# Patient Record
Sex: Male | Born: 1960 | Race: White | Hispanic: No | Marital: Single | Smoking: Current every day smoker
Health system: Southern US, Community
[De-identification: ages and names within clinical notes are randomized; demographics above are authoritative.]

## PROBLEM LIST (undated history)

## (undated) DIAGNOSIS — K219 Gastro-esophageal reflux disease without esophagitis: Secondary | ICD-10-CM

## (undated) DIAGNOSIS — I639 Cerebral infarction, unspecified: Secondary | ICD-10-CM

## (undated) DIAGNOSIS — I10 Essential (primary) hypertension: Secondary | ICD-10-CM

## (undated) DIAGNOSIS — I619 Nontraumatic intracerebral hemorrhage, unspecified: Secondary | ICD-10-CM

## (undated) DIAGNOSIS — J189 Pneumonia, unspecified organism: Secondary | ICD-10-CM

## (undated) DIAGNOSIS — Z8719 Personal history of other diseases of the digestive system: Secondary | ICD-10-CM

## (undated) DIAGNOSIS — F419 Anxiety disorder, unspecified: Secondary | ICD-10-CM

## (undated) DIAGNOSIS — C029 Malignant neoplasm of tongue, unspecified: Secondary | ICD-10-CM

## (undated) DIAGNOSIS — Z8711 Personal history of peptic ulcer disease: Secondary | ICD-10-CM

## (undated) DIAGNOSIS — I2699 Other pulmonary embolism without acute cor pulmonale: Secondary | ICD-10-CM

## (undated) DIAGNOSIS — Z923 Personal history of irradiation: Secondary | ICD-10-CM

## (undated) HISTORY — PX: APPENDECTOMY: SHX54

## (undated) HISTORY — DX: Personal history of irradiation: Z92.3

---

## 2012-01-25 DIAGNOSIS — I639 Cerebral infarction, unspecified: Secondary | ICD-10-CM

## 2012-01-25 HISTORY — DX: Cerebral infarction, unspecified: I63.9

## 2012-02-20 ENCOUNTER — Emergency Department (HOSPITAL_COMMUNITY): Payer: Medicaid Other

## 2012-02-20 ENCOUNTER — Encounter (HOSPITAL_COMMUNITY): Admission: EM | Disposition: A | Discharge status: Home / Self Care | Payer: Self-pay | Source: Home / Self Care

## 2012-02-20 ENCOUNTER — Emergency Department (HOSPITAL_COMMUNITY): Payer: Medicaid Other | Admitting: Anesthesiology

## 2012-02-20 ENCOUNTER — Encounter (HOSPITAL_COMMUNITY): Payer: Self-pay | Admitting: *Deleted

## 2012-02-20 ENCOUNTER — Inpatient Hospital Stay (HOSPITAL_COMMUNITY)
Admission: EM | Admit: 2012-02-20 | Discharge: 2012-02-24 | DRG: 023 | Disposition: A | Discharge status: Home / Self Care | Payer: Medicaid Other | Attending: Emergency Medicine | Admitting: Emergency Medicine

## 2012-02-20 ENCOUNTER — Encounter (HOSPITAL_COMMUNITY): Payer: Self-pay | Admitting: Anesthesiology

## 2012-02-20 DIAGNOSIS — F1010 Alcohol abuse, uncomplicated: Secondary | ICD-10-CM | POA: Diagnosis present

## 2012-02-20 DIAGNOSIS — R279 Unspecified lack of coordination: Secondary | ICD-10-CM | POA: Diagnosis present

## 2012-02-20 DIAGNOSIS — I619 Nontraumatic intracerebral hemorrhage, unspecified: Principal | ICD-10-CM | POA: Diagnosis present

## 2012-02-20 DIAGNOSIS — E876 Hypokalemia: Secondary | ICD-10-CM | POA: Diagnosis not present

## 2012-02-20 DIAGNOSIS — J9600 Acute respiratory failure, unspecified whether with hypoxia or hypercapnia: Secondary | ICD-10-CM | POA: Diagnosis present

## 2012-02-20 DIAGNOSIS — I614 Nontraumatic intracerebral hemorrhage in cerebellum: Secondary | ICD-10-CM | POA: Diagnosis present

## 2012-02-20 DIAGNOSIS — J95821 Acute postprocedural respiratory failure: Secondary | ICD-10-CM | POA: Diagnosis not present

## 2012-02-20 DIAGNOSIS — Z79899 Other long term (current) drug therapy: Secondary | ICD-10-CM

## 2012-02-20 DIAGNOSIS — R51 Headache: Secondary | ICD-10-CM | POA: Diagnosis present

## 2012-02-20 DIAGNOSIS — I629 Nontraumatic intracranial hemorrhage, unspecified: Secondary | ICD-10-CM

## 2012-02-20 DIAGNOSIS — I69393 Ataxia following cerebral infarction: Secondary | ICD-10-CM

## 2012-02-20 DIAGNOSIS — R4182 Altered mental status, unspecified: Secondary | ICD-10-CM | POA: Diagnosis present

## 2012-02-20 DIAGNOSIS — I10 Essential (primary) hypertension: Secondary | ICD-10-CM | POA: Diagnosis present

## 2012-02-20 DIAGNOSIS — R519 Headache, unspecified: Secondary | ICD-10-CM | POA: Diagnosis present

## 2012-02-20 DIAGNOSIS — R0902 Hypoxemia: Secondary | ICD-10-CM | POA: Diagnosis present

## 2012-02-20 DIAGNOSIS — F17200 Nicotine dependence, unspecified, uncomplicated: Secondary | ICD-10-CM | POA: Diagnosis present

## 2012-02-20 HISTORY — DX: Nontraumatic intracerebral hemorrhage, unspecified: I61.9

## 2012-02-20 HISTORY — PX: CRANIECTOMY: SHX331

## 2012-02-20 HISTORY — DX: Personal history of peptic ulcer disease: Z87.11

## 2012-02-20 HISTORY — DX: Personal history of other diseases of the digestive system: Z87.19

## 2012-02-20 LAB — COMPREHENSIVE METABOLIC PANEL
ALT: 16 U/L (ref 0–53)
AST: 26 U/L (ref 0–37)
Albumin: 3.8 g/dL (ref 3.5–5.2)
CO2: 20 mEq/L (ref 19–32)
Calcium: 9.1 mg/dL (ref 8.4–10.5)
Creatinine, Ser: 0.73 mg/dL (ref 0.50–1.35)
GFR calc non Af Amer: >90 mL/min (ref >90)
Sodium: 136 mEq/L (ref 135–145)

## 2012-02-20 LAB — BASIC METABOLIC PANEL
CO2: 22 mEq/L (ref 19–32)
Calcium: 8.3 mg/dL — ABNORMAL LOW (ref 8.4–10.5)
Glucose, Bld: 193 mg/dL — ABNORMAL HIGH (ref 70–99)
Potassium: 2.9 mEq/L — ABNORMAL LOW (ref 3.5–5.1)
Sodium: 130 mEq/L — ABNORMAL LOW (ref 135–145)

## 2012-02-20 LAB — DIFFERENTIAL
Basophils Absolute: 0 10*3/uL (ref 0.0–0.1)
Basophils Relative: 0 % (ref 0–1)
Eosinophils Relative: 1 % (ref 0–5)
Lymphocytes Relative: 11 % — ABNORMAL LOW (ref 12–46)
Monocytes Absolute: 0.8 10*3/uL (ref 0.1–1.0)
Neutro Abs: 13.8 10*3/uL — ABNORMAL HIGH (ref 1.7–7.7)

## 2012-02-20 LAB — CBC
Hemoglobin: 12.3 g/dL — ABNORMAL LOW (ref 13.0–17.0)
MCH: 32.7 pg (ref 26.0–34.0)
MCHC: 35.3 g/dL (ref 30.0–36.0)
MCV: 94.1 fL (ref 78.0–100.0)
MCV: 93.6 fL (ref 78.0–100.0)
Platelets: 246 10*3/uL (ref 150–400)
Platelets: 232 10*3/uL (ref 150–400)
RBC: 3.76 MIL/uL — ABNORMAL LOW (ref 4.22–5.81)
RDW: 13.6 % (ref 11.5–15.5)
WBC: 16.6 10*3/uL — ABNORMAL HIGH (ref 4.0–10.5)
WBC: 14.6 10*3/uL — ABNORMAL HIGH (ref 4.0–10.5)

## 2012-02-20 LAB — URINALYSIS, ROUTINE W REFLEX MICROSCOPIC
Bilirubin Urine: NEGATIVE
Hgb urine dipstick: NEGATIVE
Ketones, ur: NEGATIVE mg/dL
Nitrite: NEGATIVE
Protein, ur: NEGATIVE mg/dL
Specific Gravity, Urine: 1.014 (ref 1.005–1.030)
Urobilinogen, UA: 0.2 mg/dL (ref 0.0–1.0)

## 2012-02-20 LAB — PROTIME-INR
INR: 0.98 (ref 0.00–1.49)
Prothrombin Time: 12.8 seconds (ref 11.6–15.2)

## 2012-02-20 LAB — HEPATIC FUNCTION PANEL
ALT: 14 U/L (ref 0–53)
AST: 21 U/L (ref 0–37)
Alkaline Phosphatase: 63 U/L (ref 39–117)
Bilirubin, Direct: 0.1 mg/dL (ref 0.0–0.3)
Total Bilirubin: 0.3 mg/dL (ref 0.3–1.2)

## 2012-02-20 LAB — APTT: aPTT: 27 seconds (ref 24–37)

## 2012-02-20 LAB — POCT I-STAT, CHEM 8
Calcium, Ion: 1.17 mmol/L (ref 1.12–1.23)
Creatinine, Ser: 0.9 mg/dL (ref 0.50–1.35)
Glucose, Bld: 231 mg/dL — ABNORMAL HIGH (ref 70–99)
HCT: 41 % (ref 39.0–52.0)
Hemoglobin: 13.9 g/dL (ref 13.0–17.0)
Potassium: 2.8 mEq/L — ABNORMAL LOW (ref 3.5–5.1)

## 2012-02-20 LAB — RAPID URINE DRUG SCREEN, HOSP PERFORMED
Amphetamines: NOT DETECTED
Barbiturates: NOT DETECTED
Benzodiazepines: POSITIVE — AB
Cocaine: NOT DETECTED
Opiates: NOT DETECTED
Tetrahydrocannabinol: POSITIVE — AB

## 2012-02-20 LAB — MAGNESIUM: Magnesium: 1.5 mg/dL (ref 1.5–2.5)

## 2012-02-20 LAB — PREPARE RBC (CROSSMATCH)

## 2012-02-20 LAB — ETHANOL: Alcohol, Ethyl (B): <11 mg/dL (ref 0–11)

## 2012-02-20 SURGERY — CRANIECTOMY POSTERIOR FOSSA DECOMPRESSION
Anesthesia: General | Wound class: Clean

## 2012-02-20 MED ORDER — DEXAMETHASONE SODIUM PHOSPHATE 4 MG/ML IJ SOLN
4.0000 mg | Freq: Four times a day (QID) | INTRAMUSCULAR | Status: DC
  Filled 2012-02-20 (×2): qty 1

## 2012-02-20 MED ORDER — ONDANSETRON HCL 4 MG/2ML IJ SOLN
INTRAMUSCULAR | Status: DC | PRN
  Administered 2012-02-20: 4 mg via INTRAVENOUS

## 2012-02-20 MED ORDER — ACETAMINOPHEN 650 MG RE SUPP: 650.0000 mg | RECTAL | Status: DC | PRN

## 2012-02-20 MED ORDER — ACETAMINOPHEN 325 MG PO TABS: 650.0000 mg | ORAL_TABLET | ORAL | Status: DC | PRN

## 2012-02-20 MED ORDER — PROPOFOL 10 MG/ML IV BOLUS
INTRAVENOUS | Status: DC | PRN
  Administered 2012-02-20: 50 mg via INTRAVENOUS
  Administered 2012-02-20: 100 mg via INTRAVENOUS

## 2012-02-20 MED ORDER — 0.9 % SODIUM CHLORIDE (POUR BTL) OPTIME
TOPICAL | Status: DC | PRN
  Administered 2012-02-20 (×2): 1000 mL

## 2012-02-20 MED ORDER — LABETALOL HCL 5 MG/ML IV SOLN
10.0000 mg | INTRAVENOUS | Status: DC | PRN
  Administered 2012-02-20 – 2012-02-21 (×8): 10 mg via INTRAVENOUS
  Filled 2012-02-20 (×5): qty 4

## 2012-02-20 MED ORDER — VANCOMYCIN HCL IN DEXTROSE 1-5 GM/200ML-% IV SOLN
1000.0000 mg | Freq: Two times a day (BID) | INTRAVENOUS | Status: AC
  Administered 2012-02-21 (×2): 1000 mg via INTRAVENOUS
  Filled 2012-02-20 (×2): qty 200

## 2012-02-20 MED ORDER — DOCUSATE SODIUM 100 MG PO CAPS
100.0000 mg | ORAL_CAPSULE | Freq: Two times a day (BID) | ORAL | Status: DC
  Administered 2012-02-20 – 2012-02-24 (×8): 100 mg via ORAL
  Filled 2012-02-20 (×9): qty 1

## 2012-02-20 MED ORDER — ROCURONIUM BROMIDE 100 MG/10ML IV SOLN
INTRAVENOUS | Status: DC | PRN
  Administered 2012-02-20: 20 mg via INTRAVENOUS
  Administered 2012-02-20: 50 mg via INTRAVENOUS

## 2012-02-20 MED ORDER — HYDROMORPHONE HCL PF 1 MG/ML IJ SOLN
INTRAMUSCULAR | Status: AC
  Filled 2012-02-20: qty 1

## 2012-02-20 MED ORDER — BACITRACIN ZINC 500 UNIT/GM EX OINT
TOPICAL_OINTMENT | CUTANEOUS | Status: DC | PRN
  Administered 2012-02-20: 1 via TOPICAL

## 2012-02-20 MED ORDER — HEMOSTATIC AGENTS (NO CHARGE) OPTIME
TOPICAL | Status: DC | PRN
  Administered 2012-02-20: 1 via TOPICAL

## 2012-02-20 MED ORDER — SODIUM CHLORIDE 0.9 % IV SOLN
INTRAVENOUS | Status: DC
  Administered 2012-02-20: 75 mL/h via INTRAVENOUS

## 2012-02-20 MED ORDER — MIDAZOLAM HCL 5 MG/5ML IJ SOLN
INTRAMUSCULAR | Status: DC | PRN
  Administered 2012-02-20: .5 mg via INTRAVENOUS

## 2012-02-20 MED ORDER — FENTANYL CITRATE 0.05 MG/ML IJ SOLN
INTRAMUSCULAR | Status: DC | PRN
  Administered 2012-02-20: 150 ug via INTRAVENOUS
  Administered 2012-02-20: 250 ug via INTRAVENOUS
  Administered 2012-02-20: 100 ug via INTRAVENOUS

## 2012-02-20 MED ORDER — THIAMINE HCL 100 MG/ML IJ SOLN
INTRAVENOUS | Status: DC
  Administered 2012-02-20 – 2012-02-21 (×2): via INTRAVENOUS
  Filled 2012-02-20 (×5): qty 1000

## 2012-02-20 MED ORDER — POTASSIUM CHLORIDE 20 MEQ/15ML (10%) PO LIQD
40.0000 meq | Freq: Three times a day (TID) | ORAL | Status: DC
  Administered 2012-02-20: 40 meq
  Filled 2012-02-20 (×2): qty 30

## 2012-02-20 MED ORDER — MICROFIBRILLAR COLL HEMOSTAT EX PADS
MEDICATED_PAD | CUTANEOUS | Status: DC | PRN
  Administered 2012-02-20: 1 via TOPICAL

## 2012-02-20 MED ORDER — DEXAMETHASONE SODIUM PHOSPHATE 10 MG/ML IJ SOLN
6.0000 mg | Freq: Four times a day (QID) | INTRAMUSCULAR | Status: DC
  Administered 2012-02-20 – 2012-02-21 (×2): 6 mg via INTRAVENOUS
  Filled 2012-02-20 (×4): qty 0.6

## 2012-02-20 MED ORDER — HYDROMORPHONE HCL PF 1 MG/ML IJ SOLN
0.2500 mg | INTRAMUSCULAR | Status: DC | PRN
  Administered 2012-02-20: 0.5 mg via INTRAVENOUS

## 2012-02-20 MED ORDER — SODIUM CHLORIDE 0.9 % IV SOLN
INTRAVENOUS | Status: DC | PRN
  Administered 2012-02-20 (×2): via INTRAVENOUS

## 2012-02-20 MED ORDER — LABETALOL HCL 5 MG/ML IV SOLN
INTRAVENOUS | Status: DC | PRN
  Administered 2012-02-20: 10 mg via INTRAVENOUS
  Administered 2012-02-20 (×2): 5 mg via INTRAVENOUS

## 2012-02-20 MED ORDER — HYDROMORPHONE HCL PF 1 MG/ML IJ SOLN
0.5000 mg | INTRAMUSCULAR | Status: DC | PRN
  Administered 2012-02-20 – 2012-02-21 (×4): 0.5 mg via INTRAVENOUS
  Filled 2012-02-20 (×4): qty 1

## 2012-02-20 MED ORDER — ONDANSETRON HCL 4 MG PO TABS: 4.0000 mg | ORAL_TABLET | ORAL | Status: DC | PRN

## 2012-02-20 MED ORDER — DEXAMETHASONE SODIUM PHOSPHATE 4 MG/ML IJ SOLN
INTRAMUSCULAR | Status: DC | PRN
  Administered 2012-02-20: 10 mg via INTRAVENOUS

## 2012-02-20 MED ORDER — GLYCOPYRROLATE 0.2 MG/ML IJ SOLN
INTRAMUSCULAR | Status: DC | PRN
  Administered 2012-02-20: 0.6 mg via INTRAVENOUS

## 2012-02-20 MED ORDER — VANCOMYCIN HCL 1000 MG IV SOLR
1000.0000 mg | INTRAVENOUS | Status: DC | PRN
  Administered 2012-02-20: 1000 mg via INTRAVENOUS

## 2012-02-20 MED ORDER — DEXAMETHASONE SODIUM PHOSPHATE 4 MG/ML IJ SOLN: 4.0000 mg | Freq: Three times a day (TID) | INTRAMUSCULAR | Status: DC

## 2012-02-20 MED ORDER — PANTOPRAZOLE SODIUM 40 MG IV SOLR
40.0000 mg | Freq: Every day | INTRAVENOUS | Status: DC
  Administered 2012-02-20: 40 mg via INTRAVENOUS
  Filled 2012-02-20 (×2): qty 40

## 2012-02-20 MED ORDER — HYDROCODONE-ACETAMINOPHEN 5-325 MG PO TABS
1.0000 | ORAL_TABLET | ORAL | Status: DC | PRN
  Administered 2012-02-21 – 2012-02-23 (×13): 1 via ORAL
  Filled 2012-02-20 (×13): qty 1

## 2012-02-20 MED ORDER — ONDANSETRON HCL 4 MG/2ML IJ SOLN: 4.0000 mg | INTRAMUSCULAR | Status: DC | PRN

## 2012-02-20 MED ORDER — ETOMIDATE 2 MG/ML IV SOLN
INTRAVENOUS | Status: DC | PRN
  Administered 2012-02-20: 12 mg via INTRAVENOUS

## 2012-02-20 MED ORDER — SUCCINYLCHOLINE CHLORIDE 20 MG/ML IJ SOLN
INTRAMUSCULAR | Status: DC | PRN
  Administered 2012-02-20: 100 mg via INTRAVENOUS

## 2012-02-20 MED ORDER — THROMBIN 20000 UNITS EX KIT
PACK | CUTANEOUS | Status: DC | PRN
  Administered 2012-02-20: 20000 [IU] via TOPICAL

## 2012-02-20 MED ORDER — SODIUM CHLORIDE 0.9 % IR SOLN
Status: DC | PRN
  Administered 2012-02-20: 18:00:00

## 2012-02-20 MED ORDER — SODIUM CHLORIDE 0.9 % IV SOLN
INTRAVENOUS | Status: DC | PRN
  Administered 2012-02-20: 17:00:00 via INTRAVENOUS

## 2012-02-20 MED ORDER — PROMETHAZINE HCL 25 MG PO TABS: 12.5000 mg | ORAL_TABLET | ORAL | Status: DC | PRN

## 2012-02-20 MED ORDER — ALBUTEROL SULFATE HFA 108 (90 BASE) MCG/ACT IN AERS: 8.0000 | INHALATION_SPRAY | RESPIRATORY_TRACT | Status: DC | PRN

## 2012-02-20 MED ORDER — IPRATROPIUM-ALBUTEROL 18-103 MCG/ACT IN AERO
6.0000 | INHALATION_SPRAY | RESPIRATORY_TRACT | Status: DC
  Administered 2012-02-21 (×2): 6 via RESPIRATORY_TRACT
  Filled 2012-02-20 (×2): qty 14.7

## 2012-02-20 MED ORDER — NEOSTIGMINE METHYLSULFATE 1 MG/ML IJ SOLN
INTRAMUSCULAR | Status: DC | PRN
  Administered 2012-02-20: 5 mg via INTRAVENOUS

## 2012-02-20 SURGICAL SUPPLY — 78 items
ADH SKN CLS APL DERMABOND .7 (GAUZE/BANDAGES/DRESSINGS) ×1
BAG DECANTER FOR FLEXI CONT (MISCELLANEOUS) ×2 IMPLANT
BANDAGE GAUZE 4  KLING STR (GAUZE/BANDAGES/DRESSINGS) IMPLANT
BLADE CLIPPER SURG NEURO (BLADE) ×1 IMPLANT
BLADE SURG 11 STRL SS (BLADE) ×2 IMPLANT
BLADE SURG ROTATE 9660 (MISCELLANEOUS) IMPLANT
BNDG COHESIVE 4X5 TAN NS LF (GAUZE/BANDAGES/DRESSINGS) IMPLANT
BRUSH SCRUB EZ PLAIN DRY (MISCELLANEOUS) ×2 IMPLANT
BUR ACORN 9.0 PRECISION (BURR) ×2 IMPLANT
BUR ADDG 1.1 (BURR) IMPLANT
BUR ROUTER D-58 CRANI (BURR) ×1 IMPLANT
CANISTER SUCTION 2500CC (MISCELLANEOUS) ×3 IMPLANT
CLIP TI MEDIUM 6 (CLIP) IMPLANT
CLOTH BEACON ORANGE TIMEOUT ST (SAFETY) ×2 IMPLANT
CONT SPEC 4OZ CLIKSEAL STRL BL (MISCELLANEOUS) ×2 IMPLANT
CORDS BIPOLAR (ELECTRODE) ×2 IMPLANT
DECANTER SPIKE VIAL GLASS SM (MISCELLANEOUS) ×2 IMPLANT
DERMABOND ADVANCED (GAUZE/BANDAGES/DRESSINGS) ×1
DERMABOND ADVANCED .7 DNX12 (GAUZE/BANDAGES/DRESSINGS) ×1 IMPLANT
DRAPE NEUROLOGICAL W/INCISE (DRAPES) ×2 IMPLANT
DRAPE SURG 17X23 STRL (DRAPES) IMPLANT
DRAPE WARM FLUID 44X44 (DRAPE) ×2 IMPLANT
DURAGUARD 04CMX04CM ×1 IMPLANT
DURAPREP 6ML APPLICATOR 50/CS (WOUND CARE) ×1 IMPLANT
ELECT REM PT RETURN 9FT ADLT (ELECTROSURGICAL) ×2
ELECTRODE REM PT RTRN 9FT ADLT (ELECTROSURGICAL) ×1 IMPLANT
GAUZE SPONGE 4X4 16PLY XRAY LF (GAUZE/BANDAGES/DRESSINGS) IMPLANT
GLOVE BIO SURGEON STRL SZ 6.5 (GLOVE) ×2 IMPLANT
GLOVE BIO SURGEON STRL SZ7 (GLOVE) ×1 IMPLANT
GLOVE BIOGEL PI IND STRL 7.0 (GLOVE) IMPLANT
GLOVE BIOGEL PI INDICATOR 7.0 (GLOVE) ×1
GLOVE ECLIPSE 6.5 STRL STRAW (GLOVE) ×1 IMPLANT
GLOVE ECLIPSE 8.5 STRL (GLOVE) ×5 IMPLANT
GLOVE EXAM NITRILE LRG STRL (GLOVE) IMPLANT
GLOVE EXAM NITRILE MD LF STRL (GLOVE) ×1 IMPLANT
GLOVE EXAM NITRILE XL STR (GLOVE) IMPLANT
GLOVE EXAM NITRILE XS STR PU (GLOVE) IMPLANT
GLOVE INDICATOR 7.5 STRL GRN (GLOVE) ×1 IMPLANT
GOWN BRE IMP SLV AUR LG STRL (GOWN DISPOSABLE) ×4 IMPLANT
GOWN BRE IMP SLV AUR XL STRL (GOWN DISPOSABLE) ×3 IMPLANT
GOWN STRL REIN 2XL LVL4 (GOWN DISPOSABLE) ×1 IMPLANT
HEMOSTAT SURGICEL 2X14 (HEMOSTASIS) ×1 IMPLANT
HOOK DURA (MISCELLANEOUS) ×2 IMPLANT
KIT BASIN OR (CUSTOM PROCEDURE TRAY) ×2 IMPLANT
KIT ROOM TURNOVER OR (KITS) ×2 IMPLANT
MARKER SPHERE PSV REFLC NDI (MISCELLANEOUS) IMPLANT
NDL HYPO 25X1 1.5 SAFETY (NEEDLE) ×1 IMPLANT
NEEDLE HYPO 25X1 1.5 SAFETY (NEEDLE) ×2 IMPLANT
NS IRRIG 1000ML POUR BTL (IV SOLUTION) ×3 IMPLANT
PACK CRANIOTOMY (CUSTOM PROCEDURE TRAY) ×2 IMPLANT
PAD ARMBOARD 7.5X6 YLW CONV (MISCELLANEOUS) ×2 IMPLANT
PAD EYE OVAL STERILE LF (GAUZE/BANDAGES/DRESSINGS) IMPLANT
PATTIES SURGICAL .25X.25 (GAUZE/BANDAGES/DRESSINGS) IMPLANT
PATTIES SURGICAL .5 X.5 (GAUZE/BANDAGES/DRESSINGS) IMPLANT
PATTIES SURGICAL .5 X3 (DISPOSABLE) IMPLANT
PATTIES SURGICAL 1X1 (DISPOSABLE) IMPLANT
PIN MAYFIELD SKULL DISP (PIN) ×1 IMPLANT
SEALANT DURASEAL SPINE 3ML (Neuro Prosthesis/Implant) ×2 IMPLANT
SPECIMEN JAR SMALL (MISCELLANEOUS) IMPLANT
SPONGE GAUZE 4X4 12PLY (GAUZE/BANDAGES/DRESSINGS) ×2 IMPLANT
SPONGE NEURO XRAY DETECT 1X3 (DISPOSABLE) IMPLANT
SPONGE SURGIFOAM ABS GEL 100 (HEMOSTASIS) IMPLANT
STAPLER SKIN PROX WIDE 3.9 (STAPLE) ×2 IMPLANT
SUT ETHILON 3 0 FSL (SUTURE) IMPLANT
SUT NURALON 4 0 TR CR/8 (SUTURE) ×6 IMPLANT
SUT VIC AB 0 CT1 18XCR BRD8 (SUTURE) IMPLANT
SUT VIC AB 0 CT1 8-18 (SUTURE)
SUT VIC AB 1 CT1 18XCR BRD 8 (SUTURE) IMPLANT
SUT VIC AB 1 CT1 8-18 (SUTURE) ×2
SUT VIC AB 2-0 CT1 18 (SUTURE) ×1 IMPLANT
SYR 20ML ECCENTRIC (SYRINGE) ×2 IMPLANT
SYR CONTROL 10ML LL (SYRINGE) ×2 IMPLANT
TAPE CLOTH SURG 4X10 WHT LF (GAUZE/BANDAGES/DRESSINGS) ×1 IMPLANT
TOWEL OR 17X24 6PK STRL BLUE (TOWEL DISPOSABLE) ×2 IMPLANT
TOWEL OR 17X26 10 PK STRL BLUE (TOWEL DISPOSABLE) ×2 IMPLANT
TRAY FOLEY CATH 14FRSI W/METER (CATHETERS) ×1 IMPLANT
UNDERPAD 30X30 INCONTINENT (UNDERPADS AND DIAPERS) IMPLANT
WATER STERILE IRR 1000ML POUR (IV SOLUTION) ×2 IMPLANT

## 2012-02-20 NOTE — Anesthesia Postprocedure Evaluation (Signed)
  Anesthesia Post-op Note  Patient: Jason Strong  Procedure(s) Performed: Procedure(s) (LRB) with comments: CRANIECTOMY POSTERIOR FOSSA DECOMPRESSION (N/A) - Craniectomy Posterior Fossa Decompression   Patient Location: PACU  Anesthesia Type:General  Level of Consciousness: awake, alert , oriented and patient cooperative  Airway and Oxygen Therapy: Patient Spontanous Breathing and Patient connected to nasal cannula oxygen  Post-op Pain: none  Post-op Assessment: Post-op Vital signs reviewed, Patient's Cardiovascular Status Stable, Respiratory Function Stable, Patent Airway, No signs of Nausea or vomiting and Pain level controlled  Post-op Vital Signs: Reviewed and stable  Complications: No apparent anesthesia complications

## 2012-02-20 NOTE — ED Provider Notes (Signed)
History     CSN: [id number]  Arrival date & time 02/20/12  1517   First MD Initiated Contact with Patient 02/20/12 1519      Chief Complaint  Patient presents with  . Code Stroke  level 5 caveat due to urgent need for intervention.   (Consider location/radiation/quality/duration/timing/severity/associated sxs/prior treatment) The history is provided by the patient and the EMS personnel.  patient presents as a code stroke. Patient at around 12:30 felt difficulty walking. He states he fell down and was unable to get back up. He states that he had more episodes it got worse around 2:00 today. He had had a left-sided headache it is since improved. He may have had some difficulty speaking. Patient smokes 2 packs a day. He states he does not have any medical problems, however he does not see a doctor.  Past Medical History  Diagnosis Date  . History of stomach ulcers     Past Surgical History  Procedure Date  . Abdominal surgery     No family history on file.  History  Substance Use Topics  . Smoking status: Current Every Day Smoker    Types: Cigarettes  . Smokeless tobacco: Not on file  . Alcohol Use: 25.2 oz/week    42 Cans of beer per week      Review of Systems  Unable to perform ROS: Other  HENT: Negative for neck pain.   Respiratory: Negative for shortness of breath.   Cardiovascular: Negative for chest pain.  Neurological: Positive for weakness and headaches. Negative for seizures and syncope.    Allergies  Review of patient's allergies indicates no known allergies.  Home Medications  No current outpatient prescriptions on file.  BP 160/76  Pulse 71  Resp 11  SpO2 98%  Physical Exam  Constitutional: He is oriented to person, place, and time. He appears well-developed and well-nourished.  HENT:  Head: Normocephalic.  Eyes: EOM are normal. Pupils are equal, round, and reactive to light.  Neck: Neck supple.  Cardiovascular: Normal rate.     Pulmonary/Chest: Effort normal. No respiratory distress.       Mildly harsh breath sounds.  Abdominal: There is no tenderness.  Musculoskeletal: Normal range of motion.  Neurological: He is alert and oriented to person, place, and time.       Left facial droop. EOMI with some nystagmus. Complete NIH scoring done by neurology with score of 5.   Skin: Skin is warm and dry.    ED Course  Procedures (including critical care time)  Labs Reviewed  CBC - Abnormal; Notable for the following:    WBC 16.6 (*)     RBC 3.91 (*)     HCT 36.8 (*)     All other components within normal limits  DIFFERENTIAL - Abnormal; Notable for the following:    Neutrophils Relative 83 (*)     Neutro Abs 13.8 (*)     Lymphocytes Relative 11 (*)     All other components within normal limits  POCT I-STAT, CHEM 8 - Abnormal; Notable for the following:    Potassium 2.8 (*)     BUN 4 (*)     Glucose, Bld 231 (*)     All other components within normal limits  PROTIME-INR  APTT  POCT I-STAT TROPONIN I  ETHANOL  COMPREHENSIVE METABOLIC PANEL  TROPONIN I  URINE RAPID DRUG SCREEN (HOSP PERFORMED)  URINALYSIS, ROUTINE W REFLEX MICROSCOPIC   Ct Head Wo Contrast  02/20/2012  *RADIOLOGY REPORT*  Clinical Data: Left-sided weakness with tongue deviation.  Code stroke.  CT HEAD WITHOUT CONTRAST  Technique:  Contiguous axial images were obtained from the base of the skull through the vertex without contrast.  Comparison: None.  Findings: There is a large acute cerebellar vermian hematoma off midline to the left.  This measures 4.1 x 3.2 cm transverse and is associated with surrounding edema.  There is mass effect on the third ventricle which is nearly obliterated.  The subarachnoid spaces within the posterior fossa are partially effaced. There is possibly some edema within the pons.  There is no evidence of supratentorial hemorrhage or hydrocephalus. There is no midline shift or extra-axial fluid collection.  The visualized  paranasal sinuses are clear.  The calvarium is intact.  IMPRESSION: Large cerebellar vermian acute hematoma most consistent with a hypertensive bleed.  There is associated mass effect in the posterior fossa with possible pontine edema.  Critical Value/emergent results were called by telephone at the time of interpretation on 02/20/2012 at 1535 hours to Dr. Petra Kuba, who verbally acknowledged these results.   Original Report Authenticated By: Carey Bullocks, M.D.      1. Intracranial hemorrhage    CRITICAL CARE Performed by: Billee Cashing   Total critical care time: 30  Critical care time was exclusive of separately billable procedures and treating other patients.  Critical care was necessary to treat or prevent imminent or life-threatening deterioration.  Critical care was time spent personally by me on the following activities: development of treatment plan with patient and/or surrogate as well as nursing, discussions with consultants, evaluation of patient's response to treatment, examination of patient, obtaining history from patient or surrogate, ordering and performing treatments and interventions, ordering and review of laboratory studies, ordering and review of radiographic studies, pulse oximetry and re-evaluation of patient's condition.   Date: 02/20/2012  Rate: 69  Rhythm: normal sinus rhythm and sinus arrhythmia  QRS Axis: normal  Intervals: normal  ST/T Wave abnormalities: normal  Conduction Disutrbances:none  Narrative Interpretation:   Old EKG Reviewed: unchanged    MDM  Patient presents as a code stroke. Found to have a large cerebellar bleed. Hypertension will require IV medication, period was taken emergently to the OR by Dr. Jordan Likes. Scene upon arrival by neurology myself. Critical care has been consulted.        Juliet Rude. Rubin Payor, MD 02/20/12 [phone]

## 2012-02-20 NOTE — ED Notes (Signed)
Changed pt's stretcher linens from being wet from falling into his tub at home; placed warm blankets on pt

## 2012-02-20 NOTE — H&P (Signed)
Jason Strong is an 52 y.o. male.   Chief Complaint: Status post fall HPI: 52 year old male with history of tobacco and alcohol abuse but no other active known medical problems presents following a fall. Patient reports that he was standing up working when he suddenly lost his balance and fell over backward. He was unable to arise secondary to poor motor control. He was taken to [facility] emergency room for evaluation. Denies history of seizure fever or other constitutional problem.  Past Medical History  Diagnosis Date  . History of stomach ulcers     Past Surgical History  Procedure Date  . Abdominal surgery     No family history on file. Social History:  reports that he has been smoking Cigarettes.  He does not have any smokeless tobacco history on file. He reports that he drinks about 25.2 ounces of alcohol per week. His drug history not on file.  Allergies: No Known Allergies   (Not in a hospital admission)  Results for orders placed during the hospital encounter of 02/20/12 (from the past 48 hour(s))  PROTIME-INR     Status: Normal   Collection Time   02/20/12  3:21 PM      Component Value Range Comment   Prothrombin Time 12.8  11.6 - 15.2 seconds    INR 0.97  0.00 - 1.49   APTT     Status: Normal   Collection Time   02/20/12  3:21 PM      Component Value Range Comment   aPTT 27  24 - 37 seconds   CBC     Status: Abnormal   Collection Time   02/20/12  3:21 PM      Component Value Range Comment   WBC 16.6 (*) 4.0 - 10.5 K/uL    RBC 3.91 (*) 4.22 - 5.81 MIL/uL    Hemoglobin 13.0  13.0 - 17.0 g/dL    HCT 19.1 (*) 47.8 - 52.0 %    MCV 94.1  78.0 - 100.0 fL    MCH 33.2  26.0 - 34.0 pg    MCHC 35.3  30.0 - 36.0 g/dL    RDW 29.5  62.1 - 30.8 %    Platelets 246  150 - 400 K/uL   DIFFERENTIAL     Status: Abnormal   Collection Time   02/20/12  3:21 PM      Component Value Range Comment   Neutrophils Relative 83 (*) 43 - 77 %    Neutro Abs 13.8 (*) 1.7 - 7.7 K/uL    Lymphocytes Relative 11 (*) 12 - 46 %    Lymphs Abs 1.8  0.7 - 4.0 K/uL    Monocytes Relative 5  3 - 12 %    Monocytes Absolute 0.8  0.1 - 1.0 K/uL    Eosinophils Relative 1  0 - 5 %    Eosinophils Absolute 0.1  0.0 - 0.7 K/uL    Basophils Relative 0  0 - 1 %    Basophils Absolute 0.0  0.0 - 0.1 K/uL   POCT I-STAT TROPONIN I     Status: Normal   Collection Time   02/20/12  3:40 PM      Component Value Range Comment   Troponin i, poc 0.00  0.00 - 0.08 ng/mL    Comment 3            POCT I-STAT, CHEM 8     Status: Abnormal   Collection Time   02/20/12  3:42 PM  Component Value Range Comment   Sodium 139  135 - 145 mEq/L    Potassium 2.8 (*) 3.5 - 5.1 mEq/L    Chloride 106  96 - 112 mEq/L    BUN 4 (*) 6 - 23 mg/dL    Creatinine, Ser 1.30  0.50 - 1.35 mg/dL    Glucose, Bld 865 (*) 70 - 99 mg/dL    Calcium, Ion 7.84  6.96 - 1.23 mmol/L    TCO2 22  0 - 100 mmol/L    Hemoglobin 13.9  13.0 - 17.0 g/dL    HCT 29.5  28.4 - 13.2 %    Ct Head Wo Contrast  02/20/2012  *RADIOLOGY REPORT*  Clinical Data: Left-sided weakness with tongue deviation.  Code stroke.  CT HEAD WITHOUT CONTRAST  Technique:  Contiguous axial images were obtained from the base of the skull through the vertex without contrast.  Comparison: None.  Findings: There is a large acute cerebellar vermian hematoma off midline to the left.  This measures 4.1 x 3.2 cm transverse and is associated with surrounding edema.  There is mass effect on the third ventricle which is nearly obliterated.  The subarachnoid spaces within the posterior fossa are partially effaced. There is possibly some edema within the pons.  There is no evidence of supratentorial hemorrhage or hydrocephalus. There is no midline shift or extra-axial fluid collection.  The visualized paranasal sinuses are clear.  The calvarium is intact.  IMPRESSION: Large cerebellar vermian acute hematoma most consistent with a hypertensive bleed.  There is associated mass effect in  the posterior fossa with possible pontine edema.  Critical Value/emergent results were called by telephone at the time of interpretation on 02/20/2012 at 1535 hours to Dr. Petra Kuba, who verbally acknowledged these results.   Original Report Authenticated By: Carey Bullocks, M.D.     Review of Systems  Constitutional: Negative.   HENT: Negative.   Eyes: Negative.   Respiratory: Negative.   Cardiovascular: Negative.   Gastrointestinal: Negative.   Genitourinary: Negative.   Musculoskeletal: Negative.   Skin: Negative.   Neurological: Negative.   Endo/Heme/Allergies: Negative.   Psychiatric/Behavioral: Negative.     Blood pressure 160/76, pulse 71, resp. rate 11, SpO2 98.00%. Physical Exam  Constitutional: He is oriented to person, place, and time. He appears well-nourished. No distress.       Disheveled male who is not in any particular distress at present.  HENT:  Head: Normocephalic and atraumatic.  Right Ear: External ear normal.  Left Ear: External ear normal.  Nose: Nose normal.  Mouth/Throat: Oropharynx is clear and moist.  Eyes: Conjunctivae normal and EOM are normal. Pupils are equal, round, and reactive to light. Right eye exhibits no discharge. Left eye exhibits no discharge.  Neck: Normal range of motion. Neck supple. No tracheal deviation present. No thyromegaly present.  Cardiovascular: Normal rate, regular rhythm, normal heart sounds and intact distal pulses.   Respiratory: Effort normal and breath sounds normal. No respiratory distress. He has no wheezes.  GI: Soft. Bowel sounds are normal. He exhibits no distension. There is no tenderness.  Musculoskeletal: Normal range of motion.  Neurological: He is alert and oriented to person, place, and time. He has normal reflexes. No cranial nerve deficit.       Awake and aware. Oriented and appropriate. Cranial nerve function intact except patient had some vertical gaze paresthesias. Denies diplopia. Motor 5 over 5  bilaterally with dysmetria.  Skin: Skin is warm and dry. No rash noted. He is not  diaphoretic. No erythema. No pallor.  Psychiatric: He has a normal mood and affect. His behavior is normal. Judgment and thought content normal.     Assessment/Plan Acute firm in hemorrhage with severe compression of the fourth ventricle. Plan emergent suboccipital craniectomy and evacuation of hemorrhage. Risks and benefits have been explained. Patient and his family wish to proceed.  Klein Willcox A 02/20/2012, 4:09 PM

## 2012-02-20 NOTE — ED Notes (Addendum)
Pt was sitting on ground fixing saw when he feel back on the ground.  His neighbor was with him, helped him up to walk home.  He fell once while walking home and once off the toilet into his tub.  No loc and pt denies hitting head when he fell.  Pt AO x 4, though slurred speech.  Neuro team, EDP  at bedside at arrival.  CBC 253 with EMS.

## 2012-02-20 NOTE — Brief Op Note (Signed)
02/20/2012  8:06 PM  PATIENT:  Jason Strong  52 y.o. male  PRE-OPERATIVE DIAGNOSIS:  Cerebellar hemmorrhage  POST-OPERATIVE DIAGNOSIS:  Cerebellar hemmorrhage  PROCEDURE:  Procedure(s) (LRB) with comments: CRANIECTOMY POSTERIOR FOSSA DECOMPRESSION (N/A) - Craniectomy Posterior Fossa Decompression   SURGEON:  Surgeon(s) and Role:    * Temple Pacini, MD - Primary    * Cristi Loron, MD - Assisting  PHYSICIAN ASSISTANT:   ASSISTANTS:    ANESTHESIA:   general  EBL:  Total I/O In: 900 [I.V.:900] Out: 850 [Urine:850]  BLOOD ADMINISTERED:none  DRAINS: none   LOCAL MEDICATIONS USED:   SPECIMEN:  No Specimen  DISPOSITION OF SPECIMEN:  N/A  COUNTS:  YES  TOURNIQUET:  * No tourniquets in log *  DICTATION: .Dragon Dictation  PLAN OF CARE: Admit to inpatient   PATIENT DISPOSITION:  ICU - extubated and stable.   Delay start of Pharmacological VTE agent (>24hrs) due to surgical blood loss or risk of bleeding: yes

## 2012-02-20 NOTE — ED Notes (Signed)
Dr. Yacoub at bedside  

## 2012-02-20 NOTE — Consult Note (Signed)
Reason for Consult: Code Stroke Referring Physician: Rubin Payor, N  CC: "I just fell over"  History is obtained from:patient  HPI: Jason Strong is a 52 y.o. male who was normal until 12:30 when he fell over and could not get back up. He also had a headache at the time. Around 2pm, he was getting worse and therefore family called 911. Once he arrived here, he was found to have a large cerebellar hemorrhage on CT.   ROS: A 14 point ROS was performed and is negative except as noted in the HPI.  PMH:  None as he does not see a doctor.   Family History: No history of stroke  Social History: Tob: 2 PPD EtOH: regular drinker, 6 beers yesterday.   Exam: Current vital signs: There were no vitals taken for this visit. Vital signs in last 24 hours:    General: in bed, NAD CV: RRR Mental Status: Patient is awake, alert, oriented to person, place, month, year, and situation. No signs of aphasia.  Cranial Nerves: II: Visual Fields are full. Pupils are equal, round, and reactive to light.  Discs  Are difficult to visualize. III,IV, VI: EOMI without ptosis or diploplia.  V: Facial sensation is symmetric to temperature VII: Facial movement is notable for left facial weakness including eyelid weakness VIII: hearing is intact to voice X: Uvula elevates symmetrically XI: Shoulder shrug is symmetric. XII: tongue deviates to left.  Motor: Tone is normal. Bulk is normal. 5/5 strength was present in all four extremities.  Sensory: Sensation is decreased on left.  Deep Tendon Reflexes: 2+ and symmetric in the biceps and patellae.  Plantars: Toes are downgoing bilaterally.  Cerebellar: FNF and HKS are dysmetric in the left leg, and both arms. Gait: Did not assess due to patient safety concerns.    I have reviewed labs in epic and the results pertinent to this consultation are: Elevated wbc.   I have reviewed the images obtained: CT head 3 x 4 x 4 cm cerebellar hemorrhage.    Impression: 52 yo M with cerebellar hemorrhage. Given the size and location, I have consulted neurosurgery who agree that surgical treatment is indicated.   Recommendations: 1) BP < 140 systolic.  2) Hold all anticoagulants.  3) To OR with neurosurgery.    Ritta Slot, MD [facility] [phone]  If 7pm- 7am, please page neurology on call at [phone].

## 2012-02-20 NOTE — ED Notes (Signed)
253 CBG result with [facility]

## 2012-02-20 NOTE — ED Notes (Signed)
Pt accompanied by stroke team, primary rn and lab to CT.

## 2012-02-20 NOTE — Consult Note (Signed)
Name: Jason Strong MRN: [id number] DOB: 01-02-1961    LOS: 0  Referring Provider:  Neurosurgery Reason for Referral:  Medical and vent management  PULMONARY / CRITICAL CARE MEDICINE  HPI:  52 year old male with history of etoh, tobacco and Goody Powder abuse presenting to the hospital after a fall.  Patient was taken to the ED where a CT was performed and showed a large cerebellar infarct.  Neurosurgery saw patient and decision was made to take patient to the OR for evacuation of blood.  Target SBP of 120-140.  PCCM was asked to consult for medical and vent management.    Past Medical History  Diagnosis Date  . History of stomach ulcers    Past Surgical History  Procedure Date  . Abdominal surgery    Prior to Admission medications   Not on File   Allergies No Known Allergies  Family History No family history on file. Social History  reports that he has been smoking Cigarettes.  He does not have any smokeless tobacco history on file. He reports that he drinks about 25.2 ounces of alcohol per week. His drug history not on file.  Review Of Systems:  Difficult to get with the patient with frequent disorientation episodes.  Brief patient description:  52 year old alcoholic with cerebellar hemorrhage while taking goody powder to the OR for evacuation.  Events Since Admission: 1/27 Crani  Current Status:  Vital Signs: Pulse Rate:  [69-71] 69  (01/27 1632) Resp:  [11-19] 19  (01/27 1632) BP: (148-162)/(76-92) 148/92 mmHg (01/27 1632) SpO2:  [92 %-100 %] 100 % (01/27 1632)  Physical Examination: General:  Chronically ill appearing male, oriented by dysarthric and ataxic on the right>L. Neuro:  As above, tongue deviates to the left, compromised motor control on the right. HEENT:  [place]/AT, PERRL, EOM-I and MMM. Neck:  Supple, -LAN and -thyromegally. Cardiovascular:  RRR, Nl S1/S2, -M/R/G. Lungs:  Diffuse end exp wheezes. Abdomen:  Soft, NT, ND and +BS. Musculoskeletal:   -edema and -tenderness. Skin:  Intact.  Active Problems:  * No active hospital problems. *    ASSESSMENT AND PLAN  PULMONARY No results found for this basename: PHART:5,PCO2:5,PCO2ART:5,PO2ART:5,HCO3:5,O2SAT:5 in the last 168 hours Ventilator Settings:   CXR:  Pending ETT:  1/27>>>  A:  VDRF post op from crani for cerebellar hemorrhage. P:   - Full vent support. - ABG and CXR post op and AM. - F/U ABG. - Will begin weaning once patient is neurologically more stable.  CARDIOVASCULAR  Lab 02/20/12 1522  TROPONINI <0.30  LATICACIDVEN --  PROBNP --   ECG:  Pending Lines: PIV  A: HTN by history, likely cause of the bleed. P:  - EKG - No need for troponins for now. - Target SBT of 120-140 and will use cardene as needed pending HR post op.  RENAL  Lab 02/20/12 1542 02/20/12 1521  NA 139 136  K 2.8* 2.9*  CL 106 101  CO2 -- 20  BUN 4* 6  CREATININE 0.90 0.73  CALCIUM -- 9.1  MG -- --  PHOS -- --   Intake/Output    None    Foley:  1/27  A:  K is 2.8, etoh use. P:   Replace K. Check Mg and Phos. Replace electrolytes as needed. NS infusion.  GASTROINTESTINAL  Lab 02/20/12 1521  AST 26  ALT 16  ALKPHOS 69  BILITOT 0.3  PROT 7.3  ALBUMIN 3.8    A:  ETOH history. P:   -  Check LFT. - Banana bag. - Check coags. - Ammonia level.  HEMATOLOGIC  Lab 02/20/12 1542 02/20/12 1521  HGB 13.9 13.0  HCT 41.0 36.8*  PLT -- 246  INR -- 0.97  APTT -- 27   A:  No active issues. P:  - Monitor.  INFECTIOUS  Lab 02/20/12 1521  WBC 16.6*  PROCALCITON --   Cultures: None Antibiotics: None  A:  No active issues. P:   Post op prophylaxis.  ENDOCRINE No results found for this basename: GLUCAP:5 in the last 168 hours A:  No active issues.   P:   Monitor.  NEUROLOGIC  A:  ICH to the OR. P:   Neuro and neuro surgery following. Intermittent sedation protocol.  CC time 45 min.  Koren Bound, M.D. Pulmonary and Critical Care  [facility] Pager: [phone]  02/20/2012, 4:37 PM

## 2012-02-20 NOTE — Anesthesia Preprocedure Evaluation (Addendum)
Anesthesia Evaluation  Patient identified by MRN, date of birth, ID band Patient awake  General Assessment Comment:Emergency case as per Dr.   Reviewed: Allergy & Precautions, H&P , NPO status , Patient's Chart, lab work & pertinent test results  History of Anesthesia Complications Negative for: history of anesthetic complications  Airway Mallampati: III TM Distance: <3 FB Neck ROM: Full    Dental  (+) Poor Dentition, Missing and Dental Advisory Given   Pulmonary shortness of breath, COPDCurrent Smoker,  2 ppd + rhonchi   + wheezing      Cardiovascular hypertension, Rhythm:Regular Rate:Normal  Unable to obtain from   Neuro/Psych  Headaches,    GI/Hepatic Neg liver ROS,   Endo/Other  negative endocrine ROS  Renal/GU negative Renal ROS     Musculoskeletal   Abdominal   Peds  Hematology negative hematology ROS (+)   Anesthesia Other Findings   Reproductive/Obstetrics                         Anesthesia Physical Anesthesia Plan  ASA: IV  Anesthesia Plan: General   Post-op Pain Management:    Induction: Intravenous  Airway Management Planned: Oral ETT  Additional Equipment: Arterial line  Intra-op Plan:   Post-operative Plan: Post-operative intubation/ventilation  Informed Consent:   Plan Discussed with: CRNA, Anesthesiologist and Surgeon  Anesthesia Plan Comments:        Anesthesia Quick Evaluation

## 2012-02-20 NOTE — Transfer of Care (Signed)
Immediate Anesthesia Transfer of Care Note  Patient: Jason Strong  Procedure(s) Performed: Procedure(s) (LRB) with comments: CRANIECTOMY POSTERIOR FOSSA DECOMPRESSION (N/A) - Craniectomy Posterior Fossa Decompression   Patient Location: PACU  Anesthesia Type:General  Level of Consciousness: awake, alert , oriented and patient cooperative  Airway & Oxygen Therapy: Patient connected to nasal cannula oxygen  Post-op Assessment: Report given to PACU RN, Post -op Vital signs reviewed and stable and Patient moving all extremities X 4  Post vital signs: Reviewed and stable  Complications: No apparent anesthesia complications

## 2012-02-20 NOTE — Op Note (Signed)
Date of procedure: 02/20/2012  Date of dictation: Same  Service: Neurosurgery  Preoperative diagnosis: Cerebellar hematoma  Postoperative diagnosis: Same  Procedure Name: Suboccipital craniectomy with evacuation of intracerebellar hematoma  Microdissection of the hemorrhage cavity.  Surgeon:Hope Brandenburger A.Enzo Treu, M.D.  Asst. Surgeon: Lovell Sheehan  Anesthesia: General  Indication: 52 year old male with the acute onset of headache and unsteadiness. 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 Patient presents now for emergent evacuation of intracerebral hematoma.  Operative note: After induction anesthesia, patient positioned prone onto bolsters with his head fixed in a flexed head position using a [place] pin headrest. Occipital region prepped and draped sterilely. Incision made from the occiput down to C2. This was carried down sharply in the midline. Muscles dissection dissected from the underlying suboccipital bone and the lamina of C1. Self-retaining retractors placed. A sizable craniectomy performed using high-speed drill Kerrison rongeurs and Leksell rongeurs. C1 laminectomy also performed. Dura elevated and incised. The dural opening was then carried out over the cerebellar hemispheres bilaterally in a Y-shaped. Underlying cerebellar hemispheres and vermis were notified. Cerebellar hemispheres were split and a small vermilion incision was made. This was carried down into the hemorrhage cavity. Hemorrhage was evacuated with gentle suction and blunt dissection. All elements of hemorrhage were removed. A small amount of bleeding tissue was encountered and this was coagulated. The hemorrhage cavity was packed with cotton balls initially. These were removed and hemostasis found to be good. Microscope used for microdissection of the hemorrhage cavity. The cavity was irrigated and there was found to be no active  bleeding. The hemorrhage cavity was then lined with Surgicel. Dura was then reapproximated utilizing a bovine pericardial graft. DuraSeal was placed over the dural repair. Wounds and close in layers with Vicryl sutures. Staples were applied to the surface. There were no apparent complications. Patient tolerated suture well and returned to recovery room postop.

## 2012-02-20 NOTE — Preoperative (Signed)
Beta Blockers   Reason not to administer Beta Blockers:Not Applicable 

## 2012-02-20 NOTE — ED Notes (Signed)
Pt brought from CT scan to room; pt undressed, in gown, on monitor, continuous pulse oximetry, blood pressure cuff and oxygen [place] (2L); vitals and EKG being performed

## 2012-02-20 NOTE — Progress Notes (Signed)
ANTIBIOTIC CONSULT NOTE - INITIAL  Pharmacy Consult for Vancomycin Indication: post-op prophylaxis x 24 hrs  No Known Allergies  Patient Measurements:   Estimated wt ~80 kg  Vital Signs: Temp: 97.1 F (36.2 C) (01/27 2039) BP: 148/94 mmHg (01/27 2045) Pulse Rate: 57  (01/27 2039) Intake/Output from previous day:   Intake/Output from this shift: Total I/O In: 900 [I.V.:900] Out: 1350 [Urine:1350]  Labs:  [facility] 02/20/12 2028 02/20/12 1542 02/20/12 1521  WBC 14.6* -- 16.6*  HGB 12.3* 13.9 13.0  PLT 232 -- 246  LABCREA -- -- --  CREATININE -- 0.90 0.73   CrCl is unknown because there is no height on file for the current visit. No results found for this basename: VANCOTROUGH:2,VANCOPEAK:2,VANCORANDOM:2,GENTTROUGH:2,GENTPEAK:2,GENTRANDOM:2,TOBRATROUGH:2,TOBRAPEAK:2,TOBRARND:2,AMIKACINPEAK:2,AMIKACINTROU:2,AMIKACIN:2, in the last 72 hours   Microbiology: No results found for this or any previous visit (from the past 720 hour(s)).  Medical History: Past Medical History  Diagnosis Date  . History of stomach ulcers     Medications:  Awaiting home medication reconciliation  Assessment: 52 y.o. male s/p emergent craniectomy with evac of intracerebellar hematoma. Received vancomycin 1gm IV pre-op ~1735. To continue vancomycin post-op x 24 hours.  Goal of Therapy:  Vancomycin trough level 10-15 mcg/ml  Plan:  1. Vancomycin 1gm IV q12h x 2 doses.  2. Will sign off. Please reconsult pharmacy if needed.  Christoper Fabian, PharmD, BCPS Clinical pharmacist, pager [phone] 02/20/2012,9:18 PM

## 2012-02-21 ENCOUNTER — Inpatient Hospital Stay (HOSPITAL_COMMUNITY): Payer: Medicaid Other

## 2012-02-21 ENCOUNTER — Encounter (HOSPITAL_COMMUNITY): Payer: Self-pay | Admitting: Neurosurgery

## 2012-02-21 DIAGNOSIS — I69993 Ataxia following unspecified cerebrovascular disease: Secondary | ICD-10-CM

## 2012-02-21 DIAGNOSIS — R519 Headache, unspecified: Secondary | ICD-10-CM | POA: Diagnosis present

## 2012-02-21 DIAGNOSIS — R51 Headache: Secondary | ICD-10-CM | POA: Diagnosis present

## 2012-02-21 DIAGNOSIS — I69393 Ataxia following cerebral infarction: Secondary | ICD-10-CM

## 2012-02-21 LAB — CBC
MCH: 32.2 pg (ref 26.0–34.0)
MCHC: 34.7 g/dL (ref 30.0–36.0)
MCV: 92.7 fL (ref 78.0–100.0)
Platelets: 245 10*3/uL (ref 150–400)
RDW: 13.6 % (ref 11.5–15.5)
WBC: 17.4 10*3/uL — ABNORMAL HIGH (ref 4.0–10.5)

## 2012-02-21 LAB — BASIC METABOLIC PANEL
Calcium: 8.9 mg/dL (ref 8.4–10.5)
Creatinine, Ser: 0.48 mg/dL — ABNORMAL LOW (ref 0.50–1.35)
GFR calc Af Amer: >90 mL/min (ref >90)
GFR calc non Af Amer: >90 mL/min (ref >90)

## 2012-02-21 LAB — HEMOGLOBIN A1C
Hgb A1c MFr Bld: 5.6 % (ref <5.7)
Mean Plasma Glucose: 114 mg/dL (ref <117)

## 2012-02-21 LAB — MAGNESIUM: Magnesium: 1.5 mg/dL (ref 1.5–2.5)

## 2012-02-21 LAB — BLOOD GAS, ARTERIAL
Bicarbonate: 22.6 mEq/L (ref 20.0–24.0)
O2 Saturation: 97.8 %
Patient temperature: 98.6
TCO2: 23.8 mmol/L (ref 0–100)
pO2, Arterial: 92.3 mmHg (ref 80.0–100.0)

## 2012-02-21 LAB — GLUCOSE, CAPILLARY
Glucose-Capillary: 127 mg/dL — ABNORMAL HIGH (ref 70–99)
Glucose-Capillary: 144 mg/dL — ABNORMAL HIGH (ref 70–99)
Glucose-Capillary: 95 mg/dL (ref 70–99)

## 2012-02-21 MED ORDER — DEXAMETHASONE 4 MG PO TABS
4.0000 mg | ORAL_TABLET | Freq: Four times a day (QID) | ORAL | Status: AC
  Administered 2012-02-21 – 2012-02-22 (×4): 4 mg via ORAL
  Filled 2012-02-21 (×4): qty 1

## 2012-02-21 MED ORDER — STROKE: EARLY STAGES OF RECOVERY BOOK
Freq: Once | Status: DC
  Filled 2012-02-21: qty 1

## 2012-02-21 MED ORDER — PANTOPRAZOLE SODIUM 40 MG PO TBEC
40.0000 mg | DELAYED_RELEASE_TABLET | Freq: Every day | ORAL | Status: DC
  Administered 2012-02-21 – 2012-02-24 (×4): 40 mg via ORAL
  Filled 2012-02-21 (×3): qty 1

## 2012-02-21 MED ORDER — BIOTENE DRY MOUTH MT LIQD
15.0000 mL | Freq: Two times a day (BID) | OROMUCOSAL | Status: DC
  Administered 2012-02-21: 15 mL via OROMUCOSAL

## 2012-02-21 MED ORDER — POTASSIUM CHLORIDE 20 MEQ/15ML (10%) PO LIQD: 40.0000 meq | ORAL | Status: DC

## 2012-02-21 MED ORDER — POTASSIUM CHLORIDE CRYS ER 20 MEQ PO TBCR
40.0000 meq | EXTENDED_RELEASE_TABLET | ORAL | Status: AC
  Administered 2012-02-21 (×2): 40 meq via ORAL
  Filled 2012-02-21 (×2): qty 2

## 2012-02-21 MED ORDER — INSULIN ASPART 100 UNIT/ML ~~LOC~~ SOLN
0.0000 [IU] | Freq: Three times a day (TID) | SUBCUTANEOUS | Status: DC
  Administered 2012-02-21 – 2012-02-24 (×7): 2 [IU] via SUBCUTANEOUS
  Administered 2012-02-24: 3 [IU] via SUBCUTANEOUS

## 2012-02-21 MED ORDER — DEXAMETHASONE 6 MG PO TABS
6.0000 mg | ORAL_TABLET | Freq: Four times a day (QID) | ORAL | Status: AC
  Administered 2012-02-21 (×3): 6 mg via ORAL
  Filled 2012-02-21 (×3): qty 1

## 2012-02-21 MED ORDER — DEXAMETHASONE 4 MG PO TABS
4.0000 mg | ORAL_TABLET | Freq: Three times a day (TID) | ORAL | Status: DC
  Administered 2012-02-23 – 2012-02-24 (×6): 4 mg via ORAL
  Filled 2012-02-21 (×7): qty 1

## 2012-02-21 MED ORDER — NICOTINE 21 MG/24HR TD PT24
21.0000 mg | MEDICATED_PATCH | Freq: Every day | TRANSDERMAL | Status: DC
  Administered 2012-02-21 – 2012-02-24 (×4): 21 mg via TRANSDERMAL
  Filled 2012-02-21 (×4): qty 1

## 2012-02-21 NOTE — Progress Notes (Signed)
Physical Therapy Evaluation Patient Details Name: Jason Strong MRN: [id number] DOB: Oct 22, 1960 Today's Date: 02/21/2012 Time: 1478-2956 PT Time Calculation (min): 33 min  PT Assessment / Plan / Recommendation Clinical Impression  Pt is 52 yo male admitted with CT revealing Large cerbellar hemorrhage. Pt underwent Suboccipital craniectomy with evacuation of intracerebellar hematoma. Pt extubated on 02/20/12. Pt wtih long standing ETOH abuse. Pt currently with diplopia and left sided ataxia.   Pt with constant nystagmus and reports 5/10 dizziness throughout mobility.  Pt will benefit from acute PT services to improve overall mobility and prepare for d/c to next venue.    PT Assessment  Patient needs continued PT services    Follow Up Recommendations  CIR    Barriers to Discharge Decreased caregiver support      Equipment Recommendations   (Will further assess)    Recommendations for Other Services Rehab consult   Frequency Min 4X/week    Precautions / Restrictions Precautions Precautions: Fall Restrictions Weight Bearing Restrictions: No   Pertinent Vitals/Pain No c/o pain; 5/10 dizziness       Mobility  Bed Mobility Bed Mobility: Supine to Sit;Sitting - Scoot to Edge of Bed Supine to Sit: 3: Mod assist;HOB elevated Sitting - Scoot to Edge of Bed: 4: Min assist;With rail Details for Bed Mobility Assistance: Pt required verbal cues for sequence and (A) to push up due to Aline in Lt UE.  Transfers Transfers: Sit to Stand;Stand to Sit Sit to Stand: 1: +2 Total assist;With upper extremity assist;From bed Sit to Stand: Patient Percentage: 50% Stand to Sit: 1: +2 Total assist;With upper extremity assist;To chair/3-in-1 Stand to Sit: Patient Percentage: 50% Details for Transfer Assistance: Pt with strong posterior lean . pt provided somatosensory cues at toes to help pt with awareness of LOB. pt verbalizes I am falling backwards, using ankle strategies but at this time  requires physical (A) to correct LOB. Pt static standing for >5 minutes.  Ambulation/Gait Ambulation/Gait Assistance: 1: +2 Total assist Ambulation/Gait: Patient Percentage: 50% Ambulation Distance (Feet): 4 Feet Assistive device: 2 person hand held assist Ambulation/Gait Assistance Details: +2 (A) to maintain balance due to posterior lean and ataxic movement. Gait Pattern: Decreased stride length;Shuffle;Ataxic Stairs: No Modified Rankin (Stroke Patients Only) Pre-Morbid Rankin Score: No symptoms Modified Rankin: Severe disability    Shoulder Instructions     Exercises     PT Diagnosis: Difficulty walking;Abnormality of gait  PT Problem List: Decreased strength;Decreased activity tolerance;Decreased balance;Decreased mobility;Decreased coordination;Decreased knowledge of use of DME;Decreased safety awareness;Impaired sensation;Decreased knowledge of precautions PT Treatment Interventions: DME instruction;Gait training;Functional mobility training;Therapeutic activities;Therapeutic exercise;Balance training;Neuromuscular re-education;Cognitive remediation;Patient/family education   PT Goals Acute Rehab PT Goals PT Goal Formulation: With patient/family Time For Goal Achievement: 03/06/12 Potential to Achieve Goals: Good Pt will Sit at [facility] of Bed: with supervision;3-5 min PT Goal: Sit at Edge Of Bed - Progress: Goal set today Pt will go Sit to Supine/Side: with min assist PT Goal: Sit to Supine/Side - Progress: Goal set today Pt will go Sit to Stand: with supervision PT Goal: Sit to Stand - Progress: Goal set today Pt will go Stand to Sit: with min assist PT Goal: Stand to Sit - Progress: Goal set today Pt will Transfer Bed to Chair/Chair to Bed: with min assist PT Transfer Goal: Bed to Chair/Chair to Bed - Progress: Goal set today Pt will Stand: with supervision;3 - 5 min PT Goal: Stand - Progress: Goal set today Pt will Ambulate: 1 - 15 feet;with +2 total assist;with  least  restrictive assistive device PT Goal: Ambulate - Progress: Goal set today  Visit Information  Last PT Received On: 02/21/12 Assistance Needed: +2 PT/OT Co-Evaluation/Treatment: Yes    Subjective Data  Subjective: "I have to take care of my daughter she is eplipetic." Patient Stated Goal: To eventually return home.   Prior Functioning  Home Living Lives With: Significant other (girlfriend and daughter) Available Help at Discharge:  (caregivere for daughter) Type of Home: Mobile home Home Access: Ramped entrance Home Layout: One level Bathroom Shower/Tub: Tub/shower unit;Curtain (garden tub and regular tub) Bathroom Toilet: Engineer, mining: None Additional Comments: sister in law takes girlfriend to get groceries Prior Function Level of Independence: Independent Able to Take Stairs?: Yes Driving: No Vocation: Unemployed Musician: No difficulties Dominant Hand: Right    Cognition  Overall Cognitive Status: Appears within functional limits for tasks assessed/performed Arousal/Alertness: Awake/alert Orientation Level: Appears intact for tasks assessed Behavior During Session: [facility] for tasks performed    Extremity/Trunk Assessment Right Upper Extremity Assessment RUE ROM/Strength/Tone: Within functional levels RUE Coordination: WFL - gross motor Left Upper Extremity Assessment LUE ROM/Strength/Tone: WFL for tasks assessed LUE Sensation:  (pt with Aline with wedge difficult to assess) LUE Coordination: WFL - gross motor Right Lower Extremity Assessment RLE ROM/Strength/Tone: Within functional levels RLE Sensation: WFL - Proprioception RLE Coordination: WFL - gross/fine motor Left Lower Extremity Assessment LLE ROM/Strength/Tone: Within functional levels LLE Sensation: Deficits LLE Sensation Deficits: Impaired proprioception. LLE Coordination: Deficits (impaired heel to shin) Trunk Assessment Trunk Assessment: Normal   Balance  Balance Balance Assessed: Yes Static Standing Balance Static Standing - Balance Support: Bilateral upper extremity supported;During functional activity Static Standing - Level of Assistance: 1: +2 Total assist Static Standing - Comment/# of Minutes: >5 minutes with strong posterior lean  End of Session PT - End of Session Equipment Utilized During Treatment: Gait belt Activity Tolerance: Patient tolerated treatment well Patient left: in chair;with call bell/phone within reach Nurse Communication: Mobility status  GP     Francesco Provencal 02/21/2012, 11:10 AM

## 2012-02-21 NOTE — Progress Notes (Signed)
eLink Physician-Brief Progress Note Patient Name: Jason Strong DOB: Apr 19, 1960 MRN: [id number]  Date of Service  02/21/2012   HPI/Events of Note  Hypokalemia   eICU Interventions  potassium replaced   Intervention Category Intermediate Interventions: Electrolyte abnormality - evaluation and management  DETERDING,ELIZABETH 02/21/2012, 6:02 AM

## 2012-02-21 NOTE — Progress Notes (Signed)
UR completed 

## 2012-02-21 NOTE — Evaluation (Signed)
Occupational Therapy Evaluation Patient Details Name: Jason Strong MRN: [id number] DOB: 1960/06/18 Today's Date: 02/21/2012 Time: 1478-2956 OT Time Calculation (min): 31 min  OT Assessment / Plan / Recommendation Clinical Impression  52 yo male admitted with CT revealing Large cerbellar hemorrhage. Pt underwent Suboccipital craniectomy with evacuation of intracerebellar hematoma. Pt extubated on 02/20/12. Pt wtih long standing ETOH abuse. Pt currently with diplopia and left sided ataxia. Ot to follow acutely. Recommend CIr for d/c planning    OT Assessment  Patient needs continued OT Services    Follow Up Recommendations  CIR    Barriers to Discharge      Equipment Recommendations  3 in 1 bedside comode    Recommendations for Other Services Rehab consult  Frequency  Min 2X/week    Precautions / Restrictions Precautions Precautions: Fall   Pertinent Vitals/Pain Stable monitored    ADL  Grooming: Wash/dry face;Minimal assistance Where Assessed - Grooming: Supported sitting Toilet Transfer: +2 Total assistance Toilet Transfer: Patient Percentage: 50% Statistician Method: Sit to Barista: Raised toilet seat with arms (or 3-in-1 over toilet) Equipment Used: Gait belt Transfers/Ambulation Related to ADLs: Pt completed basic transfers total+2 Pt 50% to right side ADL Comments: Pt currently with diplopia and self reporting challenges to vision. Pt with balance deficits that effect all aspects of adls.    OT Diagnosis: Generalized weakness;Disturbance of vision;Ataxia  OT Problem List: Decreased strength;Impaired balance (sitting and/or standing);Decreased activity tolerance;Decreased safety awareness;Decreased knowledge of use of DME or AE;Decreased knowledge of precautions;Decreased coordination OT Treatment Interventions: Self-care/ADL training;DME and/or AE instruction;Therapeutic activities;Patient/family education;Balance training;Neuromuscular  education   OT Goals Acute Rehab OT Goals OT Goal Formulation: With patient Time For Goal Achievement: 03/06/12 Potential to Achieve Goals: Good ADL Goals Pt Will Perform Grooming: with min assist;Supported;Standing at sink ADL Goal: Grooming - Progress: Goal set today Pt Will Transfer to Toilet: with mod assist;Ambulation;3-in-1 ADL Goal: Toilet Transfer - Progress: Goal set today Miscellaneous OT Goals Miscellaneous OT Goal #1: Pt will complete static standing Min (A) with self correction for LOB posteriorly OT Goal: Miscellaneous Goal #1 - Progress: Goal set today Miscellaneous OT Goal #2: Pt will complete bed mobility Supervision as precursor to adls OT Goal: Miscellaneous Goal #2 - Progress: Goal set today  Visit Information  Last OT Received On: 02/21/12 Assistance Needed: +2 PT/OT Co-Evaluation/Treatment: Yes    Subjective Data  Subjective: "I always walk with her to make sure she doesnt have a seizure" pt helps walk daughter to bus stop  Patient Stated Goal: to return home to helping daughter   Prior Functioning     Home Living Lives With: Significant other (girlfriend and daughter) Available Help at Discharge:  (caregivere for daughter) Type of Home: Mobile home Home Access: Ramped entrance Home Layout: One level Bathroom Shower/Tub: Tub/shower unit;Curtain (garden tub and regular tub) Firefighter: Engineer, mining: None Additional Comments: sister in law takes girlfriend to get groceries Prior Function Level of Independence: Independent Able to Take Stairs?: Yes Driving: No Vocation: Unemployed Communication Communication: No difficulties Dominant Hand: Right         Vision/Perception Vision - Assessment Eye Alignment: Impaired (comment) Vision Assessment: Vision tested Ocular Range of Motion: Within Functional Limits Alignment/Gaze Preference: Within Defined Limits Tracking/Visual Pursuits: Unable to hold eye position out of  midline (nystagmus) Convergence: Impaired (comment) Diplopia Assessment: Present in primary gaze Additional Comments: Pt reports supine: i see two tvs right now. PT with visual testing nystagmus with Lt beating  rotational.    Cognition  Overall Cognitive Status: Appears within functional limits for tasks assessed/performed Arousal/Alertness: Awake/alert Orientation Level: Appears intact for tasks assessed Behavior During Session: [facility] for tasks performed    Extremity/Trunk Assessment Right Upper Extremity Assessment RUE ROM/Strength/Tone: Within functional levels RUE Coordination: WFL - gross motor Left Upper Extremity Assessment LUE ROM/Strength/Tone: WFL for tasks assessed LUE Sensation:  (pt with Aline with wedge difficult to assess) LUE Coordination: WFL - gross motor Trunk Assessment Trunk Assessment: Normal     Mobility Bed Mobility Bed Mobility: Supine to Sit;Sitting - Scoot to Edge of Bed Supine to Sit: 3: Mod assist;HOB elevated Sitting - Scoot to Edge of Bed: 4: Min assist;With rail Details for Bed Mobility Assistance: Pt required verbal cues for sequence and (A) to push up due to Aline in Lt UE.  Transfers Transfers: Sit to Stand;Stand to Sit Sit to Stand: 1: +2 Total assist;With upper extremity assist;From bed Sit to Stand: Patient Percentage: 50% Stand to Sit: 1: +2 Total assist;With upper extremity assist;To chair/3-in-1 Stand to Sit: Patient Percentage: 50% Details for Transfer Assistance: Pt with strong posterior lean . pt provided somatosensory cues at toes to help pt with awareness of LOB. pt verbalizes I am falling backwards, using ankle strategies but at this time requires physical (A) to correct LOB. Pt static standing for >5 minutes.      Shoulder Instructions     Exercise     Balance Balance Balance Assessed: Yes Static Standing Balance Static Standing - Balance Support: Bilateral upper extremity supported;During functional activity Static Standing -  Level of Assistance: 1: +2 Total assist Static Standing - Comment/# of Minutes: >5 minutes with strong posterior lean   End of Session OT - End of Session Activity Tolerance: Patient tolerated treatment well Patient left: in chair;with call bell/phone within reach Nurse Communication: Mobility status;Precautions  GO     Lucile Shutters 02/21/2012, 10:35 AM Pager: [phone]

## 2012-02-21 NOTE — Progress Notes (Signed)
Postop day 1. Patient denies headache. He was able to get out of bed and was able to stand and ambulate with the aid of physical therapy. Denies any other problems.  He is afebrile. His vital signs are stable. He's required minimal when necessary medication for hypertension. Urine output good. Lab studies acceptable.  He is awake and alert. Oriented and appropriate. Cranial nerve function is intact. Motor function 5 over 5 bilaterally. No pronator drift. Patient with some truncal instability otherwise no obvious cerebellar signs. Wound clean dry and intact.  Status post cerebellar hemorrhage with subsequent craniectomy and evacuation and postoperatively doing well. Mobilize further. Followup head CT scan in morning.

## 2012-02-21 NOTE — Progress Notes (Signed)
Rehab Admissions Coordinator Note:  Patient was screened by Trish Mage for appropriateness for an Inpatient Acute Rehab Consult.  PT and OT recommending CIR.  At this time, we are recommending Inpatient Rehab consult.  Trish Mage 02/21/2012, 11:44 AM  I can be reached at [phone].

## 2012-02-21 NOTE — Progress Notes (Signed)
Stroke Team Progress Note  HISTORY Jason Strong is a 52 y.o. male who was normal until 12:30 02/20/2012 when he fell over and could not get back up. He also had a headache at the time. Around 2pm, he was getting worse and therefore family called 911. Once he arrived here, he was found to have a large cerebellar hemorrhage on CT. Patient was not a TPA candidate secondary to hemorrhage. He was admitted  for further evaluation and treatment.  SUBJECTIVE His sister, Jason Strong, is at the bedside.  Overall he feels his condition is gradually improving. He complains of headache as well as of double vision. He lives with a "partner" who is in a wheelchair and his 5 yo daughter who has epilepsy. He was the primary care giver.  OBJECTIVE Most recent Vital Signs: Filed Vitals:   02/21/12 0405 02/21/12 0500 02/21/12 0600 02/21/12 0700  BP:  138/67 136/75 133/75  Pulse:  57 60 60  Temp: 97.9 F (36.6 C)     TempSrc: Oral     Resp:  16 17 17   SpO2:  96% 98% 98%   CBG (last 3)  No results found for this basename: GLUCAP:3 in the last 72 hours  IV Fluid Intake:     . sodium chloride 75 mL/hr at 02/21/12 0700  . banana bag IV fluid 1000 mL 100 mL/hr at 02/21/12 0700   MEDICATIONS    .  stroke: mapping our early stages of recovery book   Does not apply Once  . albuterol-ipratropium  6 puff Inhalation Q4H  . antiseptic oral rinse  15 mL Mouth Rinse BID  . dexamethasone  6 mg Intravenous Q6H   Followed by  . dexamethasone  4 mg Intravenous Q6H   Followed by  . dexamethasone  4 mg Intravenous Q8H  . dexamethasone  6 mg Oral Q6H   Followed by  . dexamethasone  4 mg Oral Q6H   Followed by  . dexamethasone  4 mg Oral TID WC  . docusate sodium  100 mg Oral BID  . HYDROmorphone      . pantoprazole (PROTONIX) IV  40 mg Intravenous QHS  . potassium chloride  40 mEq Oral Q1 Hr x 2  . vancomycin  1,000 mg Intravenous Q12H   PRN:  acetaminophen, acetaminophen, albuterol, HYDROcodone-acetaminophen,  HYDROmorphone (DILAUDID) injection, labetalol, ondansetron (ZOFRAN) IV, ondansetron, promethazine  Diet:  General thin liquids Activity:  Bedrest with Bathroom privileges DVT Prophylaxis:  SCDs   CLINICALLY SIGNIFICANT STUDIES Basic Metabolic Panel:  Lab 02/21/12 0454 02/20/12 2237 02/20/12 2028  NA 138 -- 130*  K 3.2* -- 2.9*  CL 103 -- 97  CO2 24 -- 22  GLUCOSE 220* -- 193*  BUN 4* -- 5*  CREATININE 0.48* -- 0.52  CALCIUM 8.9 -- 8.3*  MG 1.5 1.5 --  PHOS 3.5 3.1 --   Liver Function Tests:  Lab 02/20/12 2237 02/20/12 1521  AST 21 26  ALT 14 16  ALKPHOS 63 69  BILITOT 0.3 0.3  PROT 6.8 7.3  ALBUMIN 3.5 3.8   CBC:  Lab 02/21/12 0510 02/20/12 2028 02/20/12 1521  WBC 17.4* 14.6* --  NEUTROABS -- -- 13.8*  HGB 12.4* 12.3* --  HCT 35.7* 35.2* --  MCV 92.7 93.6 --  PLT 245 232 --   Coagulation:  Lab 02/20/12 2237 02/20/12 1521  LABPROT 12.9 12.8  INR 0.98 0.97   Cardiac Enzymes:  Lab 02/20/12 1522  CKTOTAL --  CKMB --  CKMBINDEX --  TROPONINI <0.30   Urinalysis:  Lab 02/20/12 2239  COLORURINE YELLOW  LABSPEC 1.014  PHURINE 7.0  GLUCOSEU 250*  HGBUR NEGATIVE  BILIRUBINUR NEGATIVE  KETONESUR NEGATIVE  PROTEINUR NEGATIVE  UROBILINOGEN 0.2  NITRITE NEGATIVE  LEUKOCYTESUR NEGATIVE   Lipid Panel No results found for this basename: chol, trig, hdl, cholhdl, vldl, ldlcalc   HgbA1C  No results found for this basename: HGBA1C    Urine Drug Screen:     Component Value Date/Time   LABOPIA NONE DETECTED 02/20/2012 2240   COCAINSCRNUR NONE DETECTED 02/20/2012 2240   LABBENZ POSITIVE* 02/20/2012 2240   AMPHETMU NONE DETECTED 02/20/2012 2240   THCU POSITIVE* 02/20/2012 2240   LABBARB NONE DETECTED 02/20/2012 2240    Alcohol Level:  Lab 02/20/12 1521  ETH <11    CT of the brain  02/22/2012 02/20/2012  Large cerebellar vermian acute hematoma most consistent with a hypertensive bleed.  There is associated mass effect in the posterior fossa with possible pontine  edema.    MRI of the brain    MRA of the brain    2D Echocardiogram    Carotid Doppler    CXR  02/20/2012  No acute cardiopulmonary disease.  EKG  normal sinus rhythm.   Therapy Recommendations   Physical Exam   Pleasant middle aged Caucasian male not in distress.Awake alert. Afebrile. Head is nontraumatic. Neck is supple without bruit. Hearing is normal. Cardiac exam no murmur or gallop. Lungs are clear to auscultation. Distal pulses are well felt.  Neurological Exam ; Awake alert oriented x 3 mildy dysarthric speech  But normal language. Mild left lower face asymmetry.extraocular moments are full range with horizontal gaze evoked nystagmus right more than left.saccadic dysmetria present Tongue midline. No drift. Mild diminished fine finger movements on left. Orbits right over left upper extremity. Mild left finger to nose dysmetria... Normal sensation . Gait deferred. ASSESSMENT [name] is a 52 y.o. male presenting with fell over and could not get up with a headache. Imaging confirms a large cerebellar vermian hemorrhage. Dr. Jordan Likes performed a suboccipital craniectomy with evacuation of intracerebellar hematoma 1/27/2014l. He is now extubated, awake and has passed his swallow screen. He complains of a headache,. Hemorrhage felt to be secondary to long-standing etoh abuse as BP not excessively high on arrival. Will continue to monitor.  On no antiplatelets prior to admission. Patient with resultant double vision and left sided ataxia.    VDRF post op, extubated this am without difficulty  Hypertension, prn labetolol for now, only 145/81 on admission  etoh abuse  Cigarette smoker, 2 ppd  THC positive on admission  Leukocytosis, 17.4, on vanc  Hypokalemia, 3.2, improved from 2.9  hyperglycemia  Hospital day # 1  TREATMENT/PLAN  OOB, thearpy evals  CT head in am  Check CBGs, add SSI  Dr. Pearlean Brownie counseled pt to stop etoh, THC and cigarettes D/W patient , sister  and answered questions. Annie Main, MSN, RN, ANVP-BC, ANP-BC, [facility] Pager: [phone] 02/21/2012 8:16 AM  I have personally obtained a history, examined the patient, evaluated imaging results, and formulated the assessment and plan of care. I agree with the above. This patient is critically ill and at significant risk of neurological worsening, death and care requires constant monitoring of vital signs, hemodynamics,respiratory and cardiac monitoring,review of multiple databases, neurological assessment, discussion with family, other specialists and medical decision making of high complexity. I spent 30 minutes of neurocritical care time  in the care of  this patient.   Delia Heady, MD Medical Director [facility] Pager: [phone] 02/21/2012 2:08 PM

## 2012-02-22 ENCOUNTER — Encounter (HOSPITAL_COMMUNITY): Payer: Self-pay | Admitting: Radiology

## 2012-02-22 ENCOUNTER — Inpatient Hospital Stay (HOSPITAL_COMMUNITY): Payer: Medicaid Other

## 2012-02-22 DIAGNOSIS — I619 Nontraumatic intracerebral hemorrhage, unspecified: Secondary | ICD-10-CM

## 2012-02-22 LAB — GLUCOSE, CAPILLARY
Glucose-Capillary: 139 mg/dL — ABNORMAL HIGH (ref 70–99)
Glucose-Capillary: 134 mg/dL — ABNORMAL HIGH (ref 70–99)

## 2012-02-22 MED ORDER — PANTOPRAZOLE SODIUM 40 MG PO TBEC: 40.0000 mg | DELAYED_RELEASE_TABLET | Freq: Every day | ORAL | Status: DC

## 2012-02-22 NOTE — Progress Notes (Signed)
Physical Therapy Treatment Patient Details Name: Jason Strong MRN: [id number] DOB: [birth year] Today's Date: 02/22/2012 Time: 6213-0865 PT Time Calculation (min): 25 min  PT Assessment / Plan / Recommendation Comments on Treatment Session  Pt very motivated and show progression with VCs for overall balance.  Noticeable increase lean to left side with standing balance this session.  Pt continues to have continuous nystagmus and reports 3/10 dizziness and rest and 5-7/10 dizziness with mobility.    Follow Up Recommendations  CIR     Equipment Recommendations   (need to further assess)    Recommendations for Other Services Rehab consult  Frequency Min 4X/week   Plan Discharge plan remains appropriate;Frequency remains appropriate    Precautions / Restrictions Precautions Precautions: Fall Restrictions Weight Bearing Restrictions: No   Pertinent Vitals/Pain No c/o pain    Mobility  Bed Mobility Bed Mobility: Not assessed Transfers Transfers: Sit to Stand;Stand to Sit Sit to Stand: 1: +2 Total assist;From chair/3-in-1 Sit to Stand: Patient Percentage: 60% Stand to Sit: 1: +2 Total assist;With upper extremity assist;To chair/3-in-1 Stand to Sit: Patient Percentage: 60% Details for Transfer Assistance: +2 (A) to initiate transfer with cues for hand placement.  Pt continues to have posterior lean and leaning to left side. Ambulation/Gait Ambulation/Gait Assistance: 1: +2 Total assist Ambulation/Gait: Patient Percentage: 50% Ambulation Distance (Feet): 25 Feet Assistive device: 2 person hand held assist Ambulation/Gait Assistance Details: +2 (A) to maintain balance with max manual, tactile and VCs for proper weight shift, step length/width, and to continue to fixate on objects with all mobility. Gait Pattern: Decreased stride length;Shuffle;Ataxic Stairs: No Modified Rankin (Stroke Patients Only) Pre-Morbid Rankin Score: No symptoms Modified Rankin: Severe disability      Exercises     PT Diagnosis:    PT Problem List:   PT Treatment Interventions:     PT Goals Acute Rehab PT Goals PT Goal Formulation: With patient/family Time For Goal Achievement: 03/06/12 Potential to Achieve Goals: Good Pt will Sit at [facility] of Bed: with supervision;3-5 min PT Goal: Sit at Edge Of Bed - Progress: Progressing toward goal Pt will go Sit to Supine/Side: with min assist PT Goal: Sit to Supine/Side - Progress: Progressing toward goal Pt will go Sit to Stand: with supervision PT Goal: Sit to Stand - Progress: Progressing toward goal Pt will go Stand to Sit: with min assist PT Goal: Stand to Sit - Progress: Progressing toward goal Pt will Transfer Bed to Chair/Chair to Bed: with min assist PT Transfer Goal: Bed to Chair/Chair to Bed - Progress: Progressing toward goal Pt will Stand: with supervision;3 - 5 min PT Goal: Stand - Progress: Progressing toward goal Pt will Ambulate: 1 - 15 feet;with +2 total assist;with least restrictive assistive device PT Goal: Ambulate - Progress: Progressing toward goal  Visit Information  Last PT Received On: 02/22/12 Assistance Needed: +2    Subjective Data  Subjective: "I'm doing ok today just a little tired." Patient Stated Goal: To go home but I'm agreeable to further therapy   Cognition  Overall Cognitive Status: Appears within functional limits for tasks assessed/performed Arousal/Alertness: Awake/alert Orientation Level: Appears intact for tasks assessed Behavior During Session: [facility] for tasks performed    Balance  Balance Balance Assessed: Yes Static Standing Balance Static Standing - Balance Support: Bilateral upper extremity supported;During functional activity Static Standing - Level of Assistance: 1: +2 Total assist Static Standing - Comment/# of Minutes: +2 (A) to maintain balance with max cues for midline and upright posture.  Pt  continues to have limited ankle strategy and attempts to correct balance with hip  strategy. Dynamic Standing Balance Dynamic Standing - Balance Support: Bilateral upper extremity supported Dynamic Standing - Level of Assistance: 1: +2 Total assist;Patient percentage (comment) Dynamic Standing - Balance Activities: Lateral lean/weight shifting;Forward lean/weight shifting  End of Session PT - End of Session Equipment Utilized During Treatment: Gait belt Activity Tolerance: Patient tolerated treatment well Patient left: in chair;with call bell/phone within reach Nurse Communication: Mobility status   GP     Kellis Topete 02/22/2012, 11:25 AM  Jake Shark, PT DPT [phone]

## 2012-02-22 NOTE — Progress Notes (Signed)
Overall doing well. Minimal headache. Still unsteady on his feet but otherwise progressing very well.  Afebrile. Vital signs stable. Neurological exam stable. Wound clean and dry.  Followup head CT scan with resolution of his cerebellar hematoma and no other significant findings.  Doing well. Continue rehabilitation efforts. Question inpatient rehabilitation for discharge versus home discharge later in week.

## 2012-02-22 NOTE — Progress Notes (Signed)
Stroke Team Progress Note  HISTORY Jason Strong is a 52 y.o. male who was normal until 12:30 01/20/2012 when he fell over and could not get back up. He also had a headache at the time. Around 2pm, he was getting worse and therefore family called 911. Once he arrived here, he was found to have a large cerebellar hemorrhage on CT. Patient was not a TPA candidate secondary to hemorrhage. He was admitted to the neuro ICU for further evaluation and treatment.  SUBJECTIVE No family is at the bedside. He is up in the chair, eating breakfast. 2 Overall he feels his condition is rapidly improving.   OBJECTIVE Most recent Vital Signs: Filed Vitals:   02/22/12 0400 02/22/12 0500 02/22/12 0600 02/22/12 0700  BP: 135/107 131/86 166/103 122/81  Pulse: 85 85 91 83  Temp: 97.7 F (36.5 C)     TempSrc: Oral     Resp: 19 13 18 15   Height:      Weight:      SpO2: 95% 95% 96% 95%   CBG (last 3)   Basename 02/21/12 2214 02/21/12 1752 02/21/12 1650  GLUCAP 160* 95 136*    IV Fluid Intake:     MEDICATIONS    .  stroke: mapping our early stages of recovery book   Does not apply Once  . dexamethasone  4 mg Oral Q6H   Followed by  . dexamethasone  4 mg Oral TID WC  . docusate sodium  100 mg Oral BID  . insulin aspart  0-15 Units Subcutaneous TID WC  . nicotine  21 mg Transdermal Daily  . pantoprazole  40 mg Oral Q1200   PRN:  acetaminophen, HYDROcodone-acetaminophen, HYDROmorphone (DILAUDID) injection, labetalol, ondansetron (ZOFRAN) IV, ondansetron  Diet:  General thin liquids Activity:  As tolerated DVT Prophylaxis:  SCDs   CLINICALLY SIGNIFICANT STUDIES Basic Metabolic Panel:  Lab 02/21/12 4098 02/20/12 2237 02/20/12 2028  NA 138 -- 130*  K 3.2* -- 2.9*  CL 103 -- 97  CO2 24 -- 22  GLUCOSE 220* -- 193*  BUN 4* -- 5*  CREATININE 0.48* -- 0.52  CALCIUM 8.9 -- 8.3*  MG 1.5 1.5 --  PHOS 3.5 3.1 --   Liver Function Tests:  Lab 02/20/12 2237 02/20/12 1521  AST 21 26  ALT 14 16    ALKPHOS 63 69  BILITOT 0.3 0.3  PROT 6.8 7.3  ALBUMIN 3.5 3.8   CBC:  Lab 02/21/12 0510 02/20/12 2028 02/20/12 1521  WBC 17.4* 14.6* --  NEUTROABS -- -- 13.8*  HGB 12.4* 12.3* --  HCT 35.7* 35.2* --  MCV 92.7 93.6 --  PLT 245 232 --   Coagulation:  Lab 02/20/12 2237 02/20/12 1521  LABPROT 12.9 12.8  INR 0.98 0.97   Cardiac Enzymes:  Lab 02/20/12 1522  CKTOTAL --  CKMB --  CKMBINDEX --  TROPONINI <0.30   Urinalysis:  Lab 02/20/12 2239  COLORURINE YELLOW  LABSPEC 1.014  PHURINE 7.0  GLUCOSEU 250*  HGBUR NEGATIVE  BILIRUBINUR NEGATIVE  KETONESUR NEGATIVE  PROTEINUR NEGATIVE  UROBILINOGEN 0.2  NITRITE NEGATIVE  LEUKOCYTESUR NEGATIVE   Lipid Panel No results found for this basename: chol, trig, hdl, cholhdl, vldl, ldlcalc   HgbA1C  Lab Results  Component Value Date   HGBA1C 5.6 02/21/2012    Urine Drug Screen:     Component Value Date/Time   LABOPIA NONE DETECTED 02/20/2012 2240   COCAINSCRNUR NONE DETECTED 02/20/2012 2240   LABBENZ POSITIVE* 02/20/2012 2240  AMPHETMU NONE DETECTED 02/20/2012 2240   THCU POSITIVE* 02/20/2012 2240   LABBARB NONE DETECTED 02/20/2012 2240    Alcohol Level:  Lab 02/20/12 1521  ETH <11   CT of the brain   02/22/2012  Postoperative changes following suboccipital craniectomy and intracerebellar hematoma evacuation, with significant decreased mass effect.  Small amount of residual blood at the surgical site as well as within the occipital horns lateral ventricles.    02/20/2012  Large cerebellar vermian acute hematoma most consistent with a hypertensive bleed.  There is associated mass effect in the posterior fossa with possible pontine edema.    MRI of the brain    MRA of the brain    CXR   02/21/2012  1.  No visible endotracheal tube. 2.  New bibasilar atelectasis.    02/20/2012   No acute cardiopulmonary disease.   EKG  normal sinus rhythm.   Therapy Recommendations CIR  Physical Exam  CIR  ASSESSMENT [name]  is a 52 y.o. male presenting with headache and inability to get up after a fall. Imaging confirms a large cerebellar hemorrhage. Dr. Jordan Likes performed a suboccipital craniectomy with evacuation of intracerebellar hematoma 02/20/2012. Hemorrhage felt to be secondary to long-standing etoh abuse as BP not excessively high on arrival. Will continue to monitor. On no antiplatelets prior to admission. Patient with resultant double vision and left sided ataxia. CIR recommended.   etoh abuse  Cigarette smoker  THC positive on admission Hypertension, prn labetolol only, BP 162/86 on admission Leukocytosis, 17.4, on vanc Hypokalemia, 3.2 Hyperglycemia, glu 220 though HgbA1c 5.4 VDRF post op, resolved  Hospital day # 2  TREATMENT/PLAN  Rehab consult  Ok to transfer to the floor from neuro standpoint  Stop etoh, THC and cigarette use  SHARON BIBY, MSN, RN, ANVP-BC, ANP-BC, GNP-BC [facility] Pager: [phone] 02/22/2012 8:13 AM  I have personally obtained a history,  , evaluated imaging results, and formulated the assessment and plan of care. I agree with the above.   Delia Heady, MD Medical Director [facility] Pager: [phone] 03/02/2012 9:53 AM

## 2012-02-22 NOTE — Consult Note (Signed)
Physical Medicine and Rehabilitation Consult Reason for Consult: Cerebellar hematoma Referring Physician: Dr. Pearlean Brownie   HPI: Jason Strong is a 52 y.o. right-handed male admitted 02/20/2012 with history of tobacco and alcohol abuse. Patient presented after a fall reports that he was standing up  when he suddenly lost his balance became dizzy and fell over backwards. There was no report of seizure. Cranial CT scan showed large cerebellar acute hematoma with compression of the brainstem and obstruction of the fourth ventricle most consistent with a hypertensive bleed with associated mass effect in the posterior fossa. Underwent suboccipital craniectomy with evacuation of hematoma 02/20/2012 per Dr. Danielle Dess. Patient was placed on Decadron protocol. Patient maintained on a regular consistency diet. Hospital course hypokalemia 2.9 with supplement added. Urine drug screen upon admission was positive marijuana. Physical therapy and occupational therapy evaluations completed 02/21/2012 reports of diplopia and ataxia. Recommendations were made for physical medicine rehabilitation consult  Complains of poor coordination and in the left arm Complains of occasional double vision No nausea or vomiting,  Review of Systems  Neurological: Positive for dizziness and headaches.  All other systems reviewed and are negative.   Past Medical History  Diagnosis Date  . History of stomach ulcers    Past Surgical History  Procedure Date  . Abdominal surgery   . Craniectomy 02/20/2012    Procedure: CRANIECTOMY POSTERIOR FOSSA DECOMPRESSION;  Surgeon: Temple Pacini, MD;  Location: [facility];  Service: Neurosurgery;  Laterality: N/A;  Craniectomy Posterior Fossa Decompression    History reviewed. No pertinent family history. Social History:  reports that he has been smoking Cigarettes.  He has a 30 pack-year smoking history. He does not have any smokeless tobacco history on file. He reports that he drinks about 25.2  ounces of alcohol per week. His drug history not on file. Allergies: No Known Allergies Medications Prior to Admission  Medication Sig Dispense Refill  . omeprazole (PRILOSEC) 20 MG capsule Take 20 mg by mouth daily as needed. For heart burn        Home: Home Living Lives With: Significant other (girlfriend and daughter) Available Help at Discharge:  (caregivere for daughter) Type of Home: Mobile home Home Access: Ramped entrance Home Layout: One level Bathroom Shower/Tub: Tub/shower unit;Curtain (garden tub and regular tub) Bathroom Toilet: Engineer, mining: None Additional Comments: sister in law takes girlfriend to get groceries  Functional History: Prior Function Able to Take Stairs?: Yes Driving: No Vocation: Unemployed Functional Status:  Mobility: Bed Mobility Bed Mobility: Supine to Sit;Sitting - Scoot to Edge of Bed Supine to Sit: 3: Mod assist;HOB elevated Sitting - Scoot to [place] of Bed: 4: Min assist;With rail Transfers Transfers: Sit to Stand;Stand to [employer] to Stand: 1: +2 Total assist;With upper extremity assist;From bed Sit to Stand: Patient Percentage: 50% Stand to Sit: 1: +2 Total assist;With upper extremity assist;To chair/3-in-1 Stand to Sit: Patient Percentage: 50% Ambulation/Gait Ambulation/Gait Assistance: 1: +2 Total assist Ambulation/Gait: Patient Percentage: 50% Ambulation Distance (Feet): 4 Feet Assistive device: 2 person hand held assist Ambulation/Gait Assistance Details: +2 (A) to maintain balance due to posterior lean and ataxic movement. Gait Pattern: Decreased stride length;Shuffle;Ataxic Stairs: No    ADL: ADL Grooming: Wash/dry face;Minimal assistance Where Assessed - Grooming: Supported sitting Toilet Transfer: +2 Total assistance Toilet Transfer Method: Sit to Barista: Raised toilet seat with arms (or 3-in-1 over toilet) Equipment Used: Gait belt Transfers/Ambulation Related to ADLs: Pt  completed basic transfers total+2 Pt 50% to right  side ADL Comments: Pt currently with diplopia and self reporting challenges to vision. Pt with balance deficits that effect all aspects of adls.  Cognition: Cognition Arousal/Alertness: Awake/alert Orientation Level: Oriented X4 Cognition Overall Cognitive Status: Appears within functional limits for tasks assessed/performed Arousal/Alertness: Awake/alert Orientation Level: Appears intact for tasks assessed Behavior During Session: [facility] for tasks performed  Blood pressure 166/103, pulse 91, temperature 97.7 F (36.5 C), temperature source Oral, resp. rate 18, height 5\' 8"  (1.727 m), weight 64.4 kg (141 lb 15.6 oz), SpO2 96.00%. Physical Exam  Constitutional: He is oriented to person, place, and time.  HENT:  Head: Normocephalic.  Eyes: EOM are normal.  Neck: Normal range of motion. Neck supple. No thyromegaly present.  Cardiovascular: Normal rate and regular rhythm.   Pulmonary/Chest: Effort normal and breath sounds normal. No respiratory distress.  Abdominal: Soft. Bowel sounds are normal. He exhibits no distension. There is no tenderness.  Musculoskeletal: He exhibits no edema.  Neurological: He is alert and oriented to person, place, and time.       Patient follows full commands. He did have some decreased fine motor skills. He was oriented to person, place and date of birth.  Skin:       Surgical site clean and dry with dressing in place  Left finger nose to finger moderate to severe dysmetria Left heel-to-shin moderate dysmetria Right heel-to-shin minimal dysmetria Right finger nose to finger normal Motor strength 5/5 in bilateral deltoid, biceps, triceps, grip, hip flexor, knee extensors, ankle dorsiflexor and plantar flexor Sensory exam is normal to light touch in both upper extremities Eyes have normal visual fields Extraocular muscles intact Horizontal Nystagmus worst toward the left  Results for orders placed during the  hospital encounter of 02/20/12 (from the past 24 hour(s))  GLUCOSE, CAPILLARY     Status: Abnormal   Collection Time   02/21/12  9:00 AM      Component Value Range   Glucose-Capillary 127 (*) 70 - 99 mg/dL  HEMOGLOBIN R6E     Status: Normal   Collection Time   02/21/12 10:15 AM      Component Value Range   Hemoglobin A1C 5.6  <5.7 %   Mean Plasma Glucose 114  <117 mg/dL  GLUCOSE, CAPILLARY     Status: Abnormal   Collection Time   02/21/12 12:40 PM      Component Value Range   Glucose-Capillary 144 (*) 70 - 99 mg/dL  GLUCOSE, CAPILLARY     Status: Abnormal   Collection Time   02/21/12  4:50 PM      Component Value Range   Glucose-Capillary 136 (*) 70 - 99 mg/dL   Comment 1 Notify RN     Comment 2 Documented in Chart    GLUCOSE, CAPILLARY     Status: Normal   Collection Time   02/21/12  5:52 PM      Component Value Range   Glucose-Capillary 95  70 - 99 mg/dL  GLUCOSE, CAPILLARY     Status: Abnormal   Collection Time   02/21/12 10:14 PM      Component Value Range   Glucose-Capillary 160 (*) 70 - 99 mg/dL   Comment 1 Notify RN     Comment 2 Documented in Chart     Ct Head Wo Contrast  02/22/2012  *RADIOLOGY REPORT*  Clinical Data: Cerebellar hemorrhage  CT HEAD WITHOUT CONTRAST  Technique:  Contiguous axial images were obtained from the base of the skull through the vertex without contrast.  Comparison: 02/20/2012  Findings: Postoperative changes following suboccipital craniectomy and intracerebellar hematoma evacuation. Pneumocephalus is not unexpected, collecting at the surgical site as well as within the foci within the frontal horns of the lateral ventricles and along the right frontal convexity.  There is a small amount of blood at the surgical site as well as dependently within the occipital horns of the lateral ventricles. Mild residual hypoattenuation involving the evacuated hematoma margins within left cerebellar parenchyma, suggesting infarction and/or edema.  Significant  reduction in mass effect.  The fourth ventricle is now visualized.  No overt hydrocephalus. Decreased downward displacement of the tonsils. No interval cortical based infarction. Clear sinuses and mastoid air cells.  IMPRESSION: Postoperative changes following suboccipital craniectomy and intracerebellar hematoma evacuation, with significant decreased mass effect.  Small amount of residual blood at the surgical site as well as within the occipital horns lateral ventricles.   Original Report Authenticated By: Jearld Lesch, M.D.    Ct Head Wo Contrast  02/20/2012  *RADIOLOGY REPORT*  Clinical Data: Left-sided weakness with tongue deviation.  Code stroke.  CT HEAD WITHOUT CONTRAST  Technique:  Contiguous axial images were obtained from the base of the skull through the vertex without contrast.  Comparison: None.  Findings: There is a large acute cerebellar vermian hematoma off midline to the left.  This measures 4.1 x 3.2 cm transverse and is associated with surrounding edema.  There is mass effect on the third ventricle which is nearly obliterated.  The subarachnoid spaces within the posterior fossa are partially effaced. There is possibly some edema within the pons.  There is no evidence of supratentorial hemorrhage or hydrocephalus. There is no midline shift or extra-axial fluid collection.  The visualized paranasal sinuses are clear.  The calvarium is intact.  IMPRESSION: Large cerebellar vermian acute hematoma most consistent with a hypertensive bleed.  There is associated mass effect in the posterior fossa with possible pontine edema.  Critical Value/emergent results were called by telephone at the time of interpretation on 02/20/2012 at 1535 hours to Dr. Petra Kuba, who verbally acknowledged these results.   Original Report Authenticated By: Carey Bullocks, M.D.    Dg Chest Port 1 View  02/21/2012  *RADIOLOGY REPORT*  Clinical Data: Stroke.  PORTABLE CHEST - 1 VIEW  Comparison: 02/20/2012  Findings:  There is no visible endotracheal tube.  New bibasilar atelectasis.  Heart size and vascularity are normal.  No effusions. No acute osseous abnormality.  IMPRESSION:  1.  No visible endotracheal tube. 2.  New bibasilar atelectasis.   Original Report Authenticated By: Francene Boyers, M.D.    Dg Chest Port 1 View  02/20/2012  *RADIOLOGY REPORT*  Clinical Data: Stroke.  PORTABLE CHEST - 1 VIEW  Comparison: None.  Findings: Heart is normal size.  No confluent airspace opacities. Biapical scarring.  No effusions.  No acute bony abnormality.  IMPRESSION: No acute cardiopulmonary disease.   Original Report Authenticated By: Charlett Nose, M.D.     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 Does the need for close, 24 hr/day medical supervision in concert with the patient's rehab needs make it unreasonable for this patient to be served in a less intensive setting? Yes 2. Co-Morbidities requiring supervision/potential complications: History of respiratory failure, headache, hypertension 3. Due to bladder management, bowel management, safety, skin/wound care, disease management, medication administration, pain management and patient education, does the patient require 24 hr/day rehab nursing? Yes 4. Does the patient require coordinated care of a  physician, rehab nurse, PT (1-2 hrs/day, 5 days/week) and OT (1-2 hrs/day, 5 days/week) to address physical and functional deficits in the context of the above medical diagnosis(es)? Yes Addressing deficits in the following areas: balance, endurance, locomotion, transferring, bowel/bladder control, bathing, dressing and toileting 5. Can the patient actively participate in an intensive therapy program of at least 3 hrs of therapy per day at least 5 days per week? Yes 6. The potential for patient to make measurable gains while on inpatient rehab is good 7. Anticipated functional outcomes upon discharge from  inpatient rehab are Supervision mobility with PT, Supervision ADLs with OT, Not applicable with SLP. 8. Estimated rehab length of stay to reach the above functional goals is: 2 weeks 9. Does the patient have adequate social supports to accommodate these discharge functional goals? Potentially 10. Anticipated D/C setting: Home 11. Anticipated post D/C treatments: HH therapy 12. Overall Rehab/Functional Prognosis: good  RECOMMENDATIONS: This patient's condition is appropriate for continued rehabilitative care in the following setting: CIR Patient has agreed to participate in recommended program. Yes Note that insurance prior authorization may be required for reimbursement for recommended care.  Comment:    02/22/2012

## 2012-02-23 LAB — GLUCOSE, CAPILLARY: Glucose-Capillary: 136 mg/dL — ABNORMAL HIGH (ref 70–99)

## 2012-02-23 NOTE — Progress Notes (Signed)
Stroke Team Progress Note  HISTORY Jason Strong is a 52 y.o. male who was normal until 12:30 01/20/2012 when he fell over and could not get back up. He also had a headache at the time. Around 2pm, he was getting worse and therefore family called 911. Once he arrived here, he was found to have a large cerebellar hemorrhage on CT. Patient was not a TPA candidate secondary to hemorrhage. He was admitted to the neuro ICU for further evaluation and treatment.  SUBJECTIVE Sitting up in chair at bedside. Await rehab.  OBJECTIVE Most recent Vital Signs: Filed Vitals:   02/22/12 2200 02/23/12 0200 02/23/12 0600 02/23/12 1000  BP: 145/98 145/98 138/100 131/81  Pulse: 87 77 82 85  Temp: 97.8 F (36.6 C) 97.6 F (36.4 C) 97.5 F (36.4 C) 98 F (36.7 C)  TempSrc:    Oral  Resp: 16 16 16 17   Height:      Weight:      SpO2: 96% 94% 94% 95%   CBG (last 3)   Basename 02/23/12 0653 02/22/12 2316 02/22/12 1641  GLUCAP 115* 140* 134*   IV Fluid Intake:     MEDICATIONS    .  stroke: mapping our early stages of recovery book   Does not apply Once  . dexamethasone  4 mg Oral TID WC  . docusate sodium  100 mg Oral BID  . insulin aspart  0-15 Units Subcutaneous TID WC  . nicotine  21 mg Transdermal Daily  . pantoprazole  40 mg Oral Q1200   PRN:  acetaminophen, HYDROcodone-acetaminophen, HYDROmorphone (DILAUDID) injection, labetalol, ondansetron (ZOFRAN) IV, ondansetron  Diet:  General thin liquids Activity:  As tolerated DVT Prophylaxis:  SCDs   CLINICALLY SIGNIFICANT STUDIES Basic Metabolic Panel:   Lab 02/21/12 0510 02/20/12 2237 02/20/12 2028  NA 138 -- 130*  K 3.2* -- 2.9*  CL 103 -- 97  CO2 24 -- 22  GLUCOSE 220* -- 193*  BUN 4* -- 5*  CREATININE 0.48* -- 0.52  CALCIUM 8.9 -- 8.3*  MG 1.5 1.5 --  PHOS 3.5 3.1 --   Liver Function Tests:   Lab 02/20/12 2237 02/20/12 1521  AST 21 26  ALT 14 16  ALKPHOS 63 69  BILITOT 0.3 0.3  PROT 6.8 7.3  ALBUMIN 3.5 3.8   CBC:    Lab 02/21/12 0510 02/20/12 2028 02/20/12 1521  WBC 17.4* 14.6* --  NEUTROABS -- -- 13.8*  HGB 12.4* 12.3* --  HCT 35.7* 35.2* --  MCV 92.7 93.6 --  PLT 245 232 --   Coagulation:   Lab 02/20/12 2237 02/20/12 1521  LABPROT 12.9 12.8  INR 0.98 0.97   Cardiac Enzymes:   Lab 02/20/12 1522  CKTOTAL --  CKMB --  CKMBINDEX --  TROPONINI <0.30   Urinalysis:   Lab 02/20/12 2239  COLORURINE YELLOW  LABSPEC 1.014  PHURINE 7.0  GLUCOSEU 250*  HGBUR NEGATIVE  BILIRUBINUR NEGATIVE  KETONESUR NEGATIVE  PROTEINUR NEGATIVE  UROBILINOGEN 0.2  NITRITE NEGATIVE  LEUKOCYTESUR NEGATIVE   Lipid Panel No results found for this basename: chol,  trig,  hdl,  cholhdl,  vldl,  ldlcalc   HgbA1C  Lab Results  Component Value Date   HGBA1C 5.6 02/21/2012    Urine Drug Screen:     Component Value Date/Time   LABOPIA NONE DETECTED 02/20/2012 2240   COCAINSCRNUR NONE DETECTED 02/20/2012 2240   LABBENZ POSITIVE* 02/20/2012 2240   AMPHETMU NONE DETECTED 02/20/2012 2240   THCU POSITIVE* 02/20/2012  2240   LABBARB NONE DETECTED 02/20/2012 2240    Alcohol Level:   Lab 02/20/12 1521  ETH <11   CT of the brain   02/22/2012  Postoperative changes following suboccipital craniectomy and intracerebellar hematoma evacuation, with significant decreased mass effect.  Small amount of residual blood at the surgical site as well as within the occipital horns lateral ventricles.    02/20/2012  Large cerebellar vermian acute hematoma most consistent with a hypertensive bleed.  There is associated mass effect in the posterior fossa with possible pontine edema.    CXR   02/21/2012  1.  No visible endotracheal tube. 2.  New bibasilar atelectasis.    02/20/2012   No acute cardiopulmonary disease.   EKG  normal sinus rhythm.   Therapy Recommendations CIR  Physical Exam  Pleasant middle-aged Caucasian male sitting comfortably in a chair.Awake alert. Afebrile. Head is nontraumatic. Neck is supple without bruit.  Hearing is normal. Cardiac exam no murmur or gallop. Lungs are clear to auscultation. Distal pulses are well felt.  Neurological Exam : awake alert oriented x2 with normal speech and language function. Extraocular moments are full range but last lateral gaze evoked nystagmus bilaterally right more than left. Face is symmetric without weakness. Tongue is midline. Motor system exam reveals no upper or lower expected drift and symmetric and equal strength but impaired finger-to-nose and knee to coordination on the left side. Gait was not tested.  ASSESSMENT [name] is a 52 y.o. male presenting with headache and inability to get up after a fall. Imaging confirms a large cerebellar hemorrhage. Dr. Jordan Likes performed a suboccipital craniectomy with evacuation of intracerebellar hematoma 02/20/2012. Hemorrhage felt to be secondary to long-standing etoh abuse as BP not excessively high on arrival. On no antiplatelets prior to admission. Patient with resultant double vision and left sided ataxia. CIR recommended.   etoh abuse  Cigarette smoker  THC positive on admission Hypertension, prn labetolol only, BP 162/86 on admission Leukocytosis, 17.4, on vanc Hypokalemia, 3.2 Hyperglycemia, glu 220 though HgbA1c 5.4 VDRF post op, resolved  Hospital day # 3  TREATMENT/PLAN  Agree with plans for Rehab at time of discharge.  Stop etoh, THC and cigarette use Ongoing aggressive risk factor control by Primary MD Stroke Service will sign off. Please call should any needs arise. Follow up with Dr. Pearlean Brownie, Stroke Clinic, in 2 months.   Annie Main, MSN, RN, ANVP-BC, ANP-BC, [facility] Pager: [phone] 02/23/2012 10:42 AM  I have personally obtained a history, examined the patient, evaluated imaging results, and formulated the assessment and plan of care. I agree with the above.  Delia Heady, MD Medical Director [facility] Pager: [phone] 02/23/2012 1:14  PM

## 2012-02-23 NOTE — Progress Notes (Signed)
No new issues. Patient denies headaches. Still somewhat unsteady when upright on his feet.  Awake and alert. Oriented and appropriate. Cranial nerve function is intact. Motor and sensory function of the extremities normal. Wound clean dry and intact.  Progressing well. Continue therapy. Patient stable and able to be transferred to the rehabilitation unit when bed available.

## 2012-02-23 NOTE — Progress Notes (Signed)
Physical Therapy Treatment Patient Details Name: Jason Strong MRN: [id number] DOB: 1961-01-14 Today's Date: 02/23/2012 Time: 7829-5621 PT Time Calculation (min): 24 min  PT Assessment / Plan / Recommendation Comments on Treatment Session   Pt making steady progress with mobility & with PT goals at this date.  Trialed ambulation with use of RW this session-- pt used fairly well but cont's to demonstrate balance & coordination deficits.      Follow Up Recommendations  CIR     Does the patient have the potential to tolerate intense rehabilitation     Barriers to Discharge        Equipment Recommendations   (will further assess)    Recommendations for Other Services Rehab consult  Frequency Min 4X/week   Plan Discharge plan remains appropriate;Frequency remains appropriate    Precautions / Restrictions Precautions Precautions: Fall Restrictions Weight Bearing Restrictions: No       Mobility  Bed Mobility Bed Mobility: Supine to Sit;Sitting - Scoot to Edge of Bed Supine to Sit: 5: Supervision Sitting - Scoot to [place] of Bed: 5: Supervision Transfers Transfers: Sit to Stand;Stand to Sit Sit to Stand: 3: Mod assist;With upper extremity assist;From bed Stand to Sit: 3: Mod assist;With upper extremity assist;With armrests;To chair/3-in-1 Details for Transfer Assistance: Cues for hand placement & technique.   (A) for stability, controlled descent, & safety.  Pt with leaning posteriorly on heels & to Lt.   Ambulation/Gait Ambulation/Gait Assistance: 1: +2 Total assist Ambulation/Gait: Patient Percentage: 60% Ambulation Distance (Feet): 40 Feet Assistive device: Rolling walker Ambulation/Gait Assistance Details: (A) for balance, safety, & RW management.  Pt with LOB posteriorly & to Lt.  Increased (A) required with turns for balance.   Gait Pattern: Step-through pattern;Decreased step length - right;Decreased step length - left;Shuffle;Ataxic Stairs: No Wheelchair  Mobility Wheelchair Mobility: No Modified Rankin (Stroke Patients Only) Pre-Morbid Rankin Score: No symptoms Modified Rankin: Moderately severe disability      PT Goals Acute Rehab PT Goals Time For Goal Achievement: 03/06/12 Potential to Achieve Goals: Good Pt will Sit at [facility] of Bed: with supervision;3-5 min Pt will go Sit to Supine/Side: with min assist Pt will go Sit to Stand: with supervision PT Goal: Sit to Stand - Progress: Progressing toward goal Pt will go Stand to Sit: with min assist PT Goal: Stand to Sit - Progress: Progressing toward goal Pt will Transfer Bed to Chair/Chair to Bed: with min assist Pt will Stand: with supervision;3 - 5 min PT Goal: Stand - Progress: Progressing toward goal Pt will Ambulate: 1 - 15 feet;with +2 total assist;with least restrictive assistive device PT Goal: Ambulate - Progress: Met  Visit Information  Last PT Received On: 02/23/12 Assistance Needed: +2    Subjective Data  Subjective: "I dont have double vision today"   Cognition  Overall Cognitive Status: Impaired Area of Impairment: Awareness of deficits;Safety/judgement Arousal/Alertness: Awake/alert Orientation Level: Appears intact for tasks assessed Behavior During Session: [facility] for tasks performed Safety/Judgement: Decreased safety judgement for tasks assessed    Balance  Static Standing Balance Static Standing - Balance Support: Left upper extremity supported;No upper extremity supported Static Standing - Level of Assistance: 4: Min assist;3: Mod assist Static Standing - Comment/# of Minutes: Min (A) for balance with L UE supported on RW & Mod (A) for balance due to LOB posteriorly & to Lt with no UE supported while performing functional activity at sink.  10 minutes.    End of Session PT - End of Session Equipment Utilized  During Treatment: Gait belt Activity Tolerance: Patient tolerated treatment well Patient left: in chair;with call bell/phone within reach Nurse  Communication: Mobility status    Verdell Face, [place] [phone] 02/23/2012

## 2012-02-23 NOTE — Progress Notes (Signed)
Rehab admissions - Evaluated for possible admission.  I met with patient today and yesterday.  I explained the benefits of inpatient rehab and I gave him inpatient rehab booklets.  I told him that his doctors are in support of inpatient rehab.  The RN assigned came in to the room and spoke with him about inpatient rehab as well.  Patient continues to want to go home.  He feels he is progressing well and can continue to progress at home.  I did offer him a bed, but he is declining inpatient rehab admission.  He is concerned about his girlfriend in a wheelchair and daughter with epilepsy at home who need him.  If he should change his mind, please feel free to call me.  #[phone]

## 2012-02-23 NOTE — Progress Notes (Signed)
Occupational Therapy Treatment Patient Details Name: Jason Strong MRN: [id number] DOB: Nov 09, 1960 Today's Date: 02/23/2012 Time: 4098-1191 OT Time Calculation (min): 24 min  OT Assessment / Plan / Recommendation Comments on Treatment Session Pt is progressing in overall mobility and ADL.  No diplopia this visit.  Used visual stabilization techniques during ambulation with cues. Continues to have significant balance issues interfering with ADL and mobility.    Follow Up Recommendations  CIR    Barriers to Discharge       Equipment Recommendations  3 in 1 bedside comode    Recommendations for Other Services Rehab consult  Frequency Min 2X/week   Plan Discharge plan remains appropriate    Precautions / Restrictions Precautions Precautions: Fall Restrictions Weight Bearing Restrictions: No   Pertinent Vitals/Pain No c/o pain.    ADL  Grooming: Wash/dry face;Brushing hair;Wash/dry hands;Minimal assistance;Moderate assistance Where Assessed - Grooming: Unsupported standing Upper Body Dressing: Set up Where Assessed - Upper Body Dressing: Unsupported sitting Toilet Transfer: Min guard (stood to urinate) Equipment Used: Gait belt;Rolling walker Transfers/Ambulation Related to ADLs: ambulating with RW and mod assist, tendency to lean posterior and L.  Verbal cues to visually fixate on sign during ambulation. ADL Comments: Pt not currently experiencing diplopia. Pt requiring min assist at sink when supported by his L UE on the sink or walker, mod assist with no UE support.    OT Diagnosis:    OT Problem List:   OT Treatment Interventions:     OT Goals Acute Rehab OT Goals OT Goal Formulation: With patient Potential to Achieve Goals: Good ADL Goals Pt Will Perform Grooming: with min assist;Supported;Standing at sink ADL Goal: Grooming - Progress: Progressing toward goals Pt Will Transfer to Toilet: with mod assist;Ambulation;3-in-1 ADL Goal: Toilet Transfer - Progress:  Progressing toward goals Miscellaneous OT Goals Miscellaneous OT Goal #1: Pt will complete static standing Min (A) with self correction for LOB posteriorly OT Goal: Miscellaneous Goal #1 - Progress: Progressing toward goals Miscellaneous OT Goal #2: Pt will complete bed mobility Supervision as precursor to adls OT Goal: Miscellaneous Goal #2 - Progress: Met  Visit Information  Last OT Received On: 02/23/12 Assistance Needed: +2 PT/OT Co-Evaluation/Treatment: Yes    Subjective Data      Prior Functioning       Cognition  Overall Cognitive Status: Impaired Area of Impairment: Awareness of deficits;Safety/judgement Arousal/Alertness: Awake/alert Orientation Level: Appears intact for tasks assessed Behavior During Session: [facility] for tasks performed Safety/Judgement: Decreased safety judgement for tasks assessed    Mobility  Shoulder Instructions Bed Mobility Bed Mobility: Supine to Sit;Sitting - Scoot to Edge of Bed Supine to Sit: 5: Supervision Sitting - Scoot to [place] of Bed: 5: Supervision Transfers Transfers: Sit to Stand;Stand to Sit Sit to Stand: 3: Mod assist;With upper extremity assist;From bed Stand to Sit: 3: Mod assist;With upper extremity assist;To chair/3-in-1       Exercises      Balance Static Standing Balance Static Standing - Balance Support: Left upper extremity supported;No upper extremity supported Static Standing - Level of Assistance: 4: Min assist;3: Mod assist Static Standing - Comment/# of Minutes: 10   End of Session OT - End of Session Activity Tolerance: Patient limited by fatigue Patient left: in chair;with call bell/phone within reach  GO     Evern Bio 02/23/2012, 11:40 AM [phone]

## 2012-02-24 DIAGNOSIS — I614 Nontraumatic intracerebral hemorrhage in cerebellum: Secondary | ICD-10-CM | POA: Diagnosis present

## 2012-02-24 LAB — TYPE AND SCREEN: Unit division: 0

## 2012-02-24 LAB — GLUCOSE, CAPILLARY
Glucose-Capillary: 148 mg/dL — ABNORMAL HIGH (ref 70–99)
Glucose-Capillary: 137 mg/dL — ABNORMAL HIGH (ref 70–99)

## 2012-02-24 MED ORDER — HYDROCODONE-ACETAMINOPHEN 5-325 MG PO TABS: 1.0000 | ORAL_TABLET | ORAL | Status: DC | PRN

## 2012-02-24 NOTE — Progress Notes (Signed)
Physical Therapy Treatment Patient Details Name: Jason Strong MRN: [id number] DOB: 1960/04/03 Today's Date: 02/24/2012 Time: 4010-2725 PT Time Calculation (min): 24 min  PT Assessment / Plan / Recommendation Comments on Treatment Session  Denies dizziness today, has  persistent left eye nystagmus.  He is declining CIR and only able to state that he wants to get home to family as his reason.l  He says his daughter has to be driven to the bus stop because she has epilepsy and cannot walk alone to the stop.  He reports she has not been going to school since he has been in hospital.  He does not recall lthat a fall with injury brought him into the hospital, and does not seem to process his great likelihood of another fall if he DC's home without adequate support.  He says his girlfriend is in a wheelchair and cannot help him.  He says he plans to go to his brother's home for a few days post acute DC and that the brother will not be there 24/7 to assist.  I believe that pt is at a high fall risk due to his balance/cerebellar issues and I do not believe he is safe to DC home UNLESS he has reliable 24 hour assist.  This was discussed with his RN Jenmnifer and she will pass along to case manager and to Dr. Jordan Likes.    Follow Up Recommendations  CIR     Does the patient have the potential to tolerate intense rehabilitation     Barriers to Discharge        Equipment Recommendations  Other (comment) (ned to further assess)    Recommendations for Other Services Rehab consult  Frequency Min 4X/week   Plan Discharge plan remains appropriate;Frequency remains appropriate    Precautions / Restrictions Precautions Precautions: Fall Restrictions Weight Bearing Restrictions: No   Pertinent Vitals/Pain No pain, no apparent distress      Mobility  Bed Mobility Bed Mobility: Supine to Sit;Sitting - Scoot to Edge of Bed Supine to Sit: 5: Supervision Sitting - Scoot to Edge of Bed: 5:  Supervision Details for Bed Mobility Assistance: vc's for sequence and safe technique Transfers Transfers: Sit to Stand;Stand to Sit Sit to Stand: 3: Mod assist;With upper extremity assist;From bed Sit to Stand: Patient Percentage: 60% Stand to Sit: 3: Mod assist;With upper extremity assist;With armrests;To chair/3-in-1 Stand to Sit: Patient Percentage: 60% Details for Transfer Assistance: Pt. needed close physical assist and facilitation due to his propensity to lean left and posteriorly in standing. Ambulation/Gait Ambulation/Gait Assistance: 1: +2 Total assist Ambulation/Gait: Patient Percentage: 60% Ambulation Distance (Feet): 80 Feet Assistive device: Rolling walker Ambulation/Gait Assistance Details: Pt. needed close physical assist for stability and balance due to his left and posterior lean and poor balance.  By last half of walk, pt.ambulated with +1 close physical assist and second person pushing reclimer behind pt.  Pt. with poor control of LEs and tends to over or undershoot foot placement.   Gait Pattern: Ataxic;Step-through pattern;Decreased step length - right;Decreased step length - left (at times, increased step length) Gait velocity: slowed Stairs: No Wheelchair Mobility Wheelchair Mobility: No Modified Rankin (Stroke Patients Only) Pre-Morbid Rankin Score: No symptoms Modified Rankin: Moderately severe disability    Exercises     PT Diagnosis:    PT Problem List:   PT Treatment Interventions:     PT Goals Acute Rehab PT Goals PT Goal: Sit to Stand - Progress: Progressing toward goal PT Goal: Stand to Sit -  Progress: Progressing toward goal PT Goal: Ambulate - Progress: Progressing toward goal  Visit Information  Last PT Received On: 02/24/12 Assistance Needed: +2    Subjective Data  Subjective: "I just want to go home to be with my family".  Pt. is declining CIR and says he will stay with his brother for a few days but reports his brother works in the  daytime.     Cognition  Overall Cognitive Status: Impaired Area of Impairment: Safety/judgement;Awareness of errors;Awareness of deficits;Other (comment) (insight into the  seriousness of his balance disturbance) Arousal/Alertness: Awake/alert Orientation Level: Appears intact for tasks assessed Behavior During Session: [facility] for tasks performed Safety/Judgement: Decreased safety judgement for tasks assessed;Decreased awareness of need for assistance Safety/Judgement - Other Comments: believes he can DC home safely (to brothers home) despite serious balance issues and risk of fall Awareness of Errors: Assistance required to correct errors made Awareness of Deficits: can state that his left arm is weak and his balance is a problem but does not believe he is at risk for a fall and in fact he does not recall that he is here due to a fall with it's resultant hemorrhage.    Balance     End of Session PT - End of Session Equipment Utilized During Treatment: Gait belt Activity Tolerance: Patient tolerated treatment well Patient left: in chair;with call bell/phone within reach Nurse Communication: Mobility status;Other (comment) (poor insight into his safety and mobility/balance status)   GP     Ferman Hamming 02/24/2012, 11:36 AM Weldon Picking PT Acute Rehab Services [phone] Beeper [phone]

## 2012-02-24 NOTE — Progress Notes (Signed)
Ihor Austin Sailer discharged Home with brother per MD order.  Discharge instructions reviewed and discussed with the patient and brother, all questions and concerns answered. Copy of instructions and scripts given to patient.   Christobal, Morado  Home Medication Instructions ZOX:[id number]   Printed on:02/24/12 2003  Medication Information                    omeprazole (PRILOSEC) 20 MG capsule Take 20 mg by mouth daily as needed. For heart burn           HYDROcodone-acetaminophen (NORCO/VICODIN) 5-325 MG per tablet Take 1 tablet by mouth every 4 (four) hours as needed.             Patients skin: incision to back of the head is approximated, closure = staples. All other skin is clean, dry and intact, no evidence of skin break down. IV site discontinued and catheter remains intact. Site without signs and symptoms of complications. Dressing and pressure applied.  Patient escorted to car by NT in a wheelchair,  no distress noted upon discharge.  Julien Nordmann [facility] 02/24/2012 8:03 PM

## 2012-02-24 NOTE — Discharge Summary (Signed)
Physician Discharge Summary  Patient ID: Jason Strong MRN: [id number] DOB/AGE: [id number] y.o.  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 Postoperatively is done very well. Preoperative headache and unsteadiness much improved. Patient has progressed well with therapy and has declined rehabilitation unit admission. With that in mind the patient requests discharge home. His blood pressure is been well controlled throughout. He is steady on his feet and his wound is healing well. Plan for home discharge with followup next week.  Consults:   Significant Diagnostic Studies:   Treatments:   Discharge Exam: Blood pressure 153/98, pulse 108, temperature 98.2 F (36.8 C), temperature source Oral, resp. rate 18, height 5\' 8"  (1.727 m), weight 64.4 kg (141 lb 15.6 oz), SpO2 100.00%. Awake and alert oriented and appropriate. Cranial nerve function is intact. Motor and sensory function extremities normal. Wound clean dry and intact.  Disposition: Final discharge disposition not confirmed     Medication List     As of 02/24/2012  6:39 PM    TAKE these medications         HYDROcodone-acetaminophen 5-325 MG per tablet   Commonly known as: NORCO/VICODIN   Take 1 tablet by mouth every 4 (four) hours as needed.      omeprazole 20 MG capsule   Commonly known as: PRILOSEC   Take 20 mg by mouth daily as needed. For heart burn           Follow-up Information    Follow up with Gates Rigg, MD. Schedule an appointment as soon as possible for a visit in 2 months. (stroke clinic)    Contact information:   [address] [facility] [place] [address] [phone]       Follow up with Kyrstan Gotwalt A, MD. Call in 1 week. (As for Lurena Joiner)    Contact information:   [address] [place] [address] [phone]          Signed: Temple Pacini 02/24/2012, 6:39 PM

## 2012-02-29 ENCOUNTER — Emergency Department (HOSPITAL_COMMUNITY)
Admission: EM | Admit: 2012-02-29 | Discharge: 2012-03-01 | Disposition: A | Discharge status: Home / Self Care | Payer: Medicaid Other | Attending: Emergency Medicine | Admitting: Emergency Medicine

## 2012-02-29 ENCOUNTER — Encounter (HOSPITAL_COMMUNITY): Payer: Self-pay | Admitting: *Deleted

## 2012-02-29 DIAGNOSIS — Z8711 Personal history of peptic ulcer disease: Secondary | ICD-10-CM | POA: Insufficient documentation

## 2012-02-29 DIAGNOSIS — I10 Essential (primary) hypertension: Secondary | ICD-10-CM | POA: Insufficient documentation

## 2012-02-29 DIAGNOSIS — Z79899 Other long term (current) drug therapy: Secondary | ICD-10-CM | POA: Insufficient documentation

## 2012-02-29 DIAGNOSIS — F17200 Nicotine dependence, unspecified, uncomplicated: Secondary | ICD-10-CM | POA: Insufficient documentation

## 2012-02-29 MED ORDER — LISINOPRIL 20 MG PO TABS: 20.0000 mg | ORAL_TABLET | Freq: Every day | ORAL | Status: DC

## 2012-02-29 NOTE — ED Notes (Addendum)
Pt reports being discharged on 02/24/12 from a stroke. Was diagnosed with HTN but not given a prescription for it when he was discharged. States that he was told to follow up with PCP for blood pressure medicine. Pt is to see PCP on Monday but took BP tonight and came to the ER for further evaluation. Denies headache, dizziness, or light headness with HTN.

## 2012-02-29 NOTE — ED Provider Notes (Signed)
History     CSN: [id number]  Arrival date & time 02/29/12  2038   First MD Initiated Contact with Patient 02/29/12 2340      Chief Complaint  Patient presents with  . Hypertension   HPI  History provided by the patient and family. Patient is a 52 year old male with recent history of stroke who presents with concerns for elevated blood pressure. Patient states he was recently released and initially told his stroke was related to some of his blood pressure issues and he expected a prescription for blood pressure medications however he never received any. Patient's blood pressure has continued to be elevated. He denies any symptoms at this time. Denies any chest pain, shortness of breath, heart palpitations or headache. Denies any new weakness or changes in previous stroke symptoms. He has developed left-sided weakness following a stroke but this is unchanged. Patient is seeking treatment for his elevated blood pressure. He does have outpatient followup planned in the coming weeks.    Past Medical History  Diagnosis Date  . History of stomach ulcers     Past Surgical History  Procedure Date  . Abdominal surgery   . Craniectomy 02/20/2012    Procedure: CRANIECTOMY POSTERIOR FOSSA DECOMPRESSION;  Surgeon: Temple Pacini, MD;  Location: [facility];  Service: Neurosurgery;  Laterality: N/A;  Craniectomy Posterior Fossa Decompression     No family history on file.  History  Substance Use Topics  . Smoking status: Current Every Day Smoker -- 2.0 packs/day for 15 years    Types: Cigarettes  . Smokeless tobacco: Not on file  . Alcohol Use: 25.2 oz/week    42 Cans of beer per week      Review of Systems  Constitutional: Negative for fever.  Respiratory: Negative for shortness of breath.   Cardiovascular: Negative for chest pain and palpitations.  Neurological: Negative for headaches.  All other systems reviewed and are negative.    Allergies  Review of patient's allergies  indicates no known allergies.  Home Medications   Current Outpatient Rx  Name  Route  Sig  Dispense  Refill  . HYDROCODONE-ACETAMINOPHEN 5-325 MG PO TABS   Oral   Take 1 tablet by mouth every 4 (four) hours as needed.   60 tablet   1   . OMEPRAZOLE 20 MG PO CPDR   Oral   Take 20 mg by mouth daily as needed. For heart burn           BP 153/97  Pulse 109  Temp 98.3 F (36.8 C) (Oral)  Resp 16  SpO2 97%  Physical Exam  Nursing note and vitals reviewed. Constitutional: He is oriented to person, place, and time. He appears well-developed and well-nourished. No distress.  HENT:  Head: Normocephalic.  Neck: Normal range of motion.  Cardiovascular: Normal rate and regular rhythm.   Pulmonary/Chest: Effort normal and breath sounds normal. He has no wheezes. He has no rales.  Abdominal: Soft.  Neurological: He is alert and oriented to person, place, and time.  Skin: Skin is warm.  Psychiatric: He has a normal mood and affect. His behavior is normal.    ED Course  Procedures      1. Hypertension       MDM  11:40PM patient seen and evaluated. Patient in no acute distress. Patient is hypertensive. At this time we'll give prescription for lisinopril. Patient has followup planned next week. This will give time to see if medication is helpful. He will continue  to followup for any additional recommendations for blood pressure control.        Angus Seller, [place] 03/01/12 [phone]

## 2012-02-29 NOTE — ED Notes (Signed)
Pt states his BP was high today and that he does not have a prescription for BP medication

## 2012-03-01 NOTE — ED Notes (Signed)
Pt states understanding of discharge instructions 

## 2012-03-01 NOTE — ED Provider Notes (Signed)
Medical screening examination/treatment/procedure(s) were performed by non-physician practitioner and as supervising physician I was immediately available for consultation/collaboration.  [name]-Adam Tonianne Fine, M.D.     [name]-Adam Armonee Bojanowski, MD 03/01/12 0701 

## 2012-03-20 ENCOUNTER — Ambulatory Visit (HOSPITAL_COMMUNITY)
Admission: RE | Admit: 2012-03-20 | Discharge: 2012-03-20 | Disposition: A | Discharge status: Home / Self Care | Payer: Medicaid Other | Source: Ambulatory Visit | Attending: Family Medicine | Admitting: Family Medicine

## 2012-03-20 DIAGNOSIS — I69993 Ataxia following unspecified cerebrovascular disease: Secondary | ICD-10-CM | POA: Insufficient documentation

## 2012-03-20 DIAGNOSIS — R269 Unspecified abnormalities of gait and mobility: Secondary | ICD-10-CM | POA: Insufficient documentation

## 2012-03-20 DIAGNOSIS — R270 Ataxia, unspecified: Secondary | ICD-10-CM | POA: Insufficient documentation

## 2012-03-20 DIAGNOSIS — IMO0001 Reserved for inherently not codable concepts without codable children: Secondary | ICD-10-CM | POA: Insufficient documentation

## 2012-03-20 DIAGNOSIS — M6281 Muscle weakness (generalized): Secondary | ICD-10-CM | POA: Insufficient documentation

## 2012-03-20 DIAGNOSIS — I10 Essential (primary) hypertension: Secondary | ICD-10-CM | POA: Insufficient documentation

## 2012-03-20 DIAGNOSIS — R262 Difficulty in walking, not elsewhere classified: Secondary | ICD-10-CM | POA: Insufficient documentation

## 2012-03-20 NOTE — Evaluation (Signed)
Physical Therapy Evaluation  Patient Details  Name: Jason Strong MRN: [id number] Date of Birth: [birth year]  Today's Date: 03/20/2012 Time: 2725-3664 PT Time Calculation (min): 50 min Charges: 1 eval, 15' PPT Visit#: 1 of 8  Re-eval: 04/19/12 Assessment Diagnosis: cerebellar stroke Surgical Date: 02/20/12 Next MD Visit: Dr. Modesto Charon 2/28  Past Medical History:  Past Medical History  Diagnosis Date  . History of stomach ulcers    Past Surgical History:  Past Surgical History  Procedure Laterality Date  . Abdominal surgery    . Craniectomy  02/20/2012    Procedure: CRANIECTOMY POSTERIOR FOSSA DECOMPRESSION;  Surgeon: Temple Pacini, MD;  Location: [facility];  Service: Neurosurgery;  Laterality: N/A;  Craniectomy Posterior Fossa Decompression     Subjective Symptoms/Limitations Pertinent History: Pt is a 52 year old male referred to PT s/p cerebellar stroke on 02/20/12.  He reports that he did not recieve HHPT.   He is currently walking w/a rollator at home.  His c/co is losing his balance while he is at home.  He reports that his diplopia has resolved.  He declined CIR secondary to his girlfriend is in a W/C and takes care if his 72 year old daughter with epilepsy.  He currently is not working in order to take care of his family.  Reports that he would eventually one day like to return to work if he is able.  Pain Assessment Currently in Pain?: No/denies  Precautions/Restrictions  Precautions Precautions: Fall  Prior Functioning  Home Living Lives With: Significant other;Daughter Type of Home: House Home Access: Ramped entrance Prior Function Vocation Requirements: Currently unemployeed secondary to daughters medical condition Comments: Takes care of his 83 year old daughter who suffers from seizures.  His girlfriend requires A and is in a W/C. Enjoys playing cards with his family and watching TV  Sensation/Coordination/Flexibility/Functional Tests Coordination Gross  Motor Movements are Fluid and Coordinated: No Coordination and Movement Description: UE and LE ataxic movements Finger Nose Finger Test: ataxic Heel Shin Test: ataxic  Assessment LLE Strength Left Hip Flexion: 4/5 Left Hip Extension: 4/5 Left Hip ABduction: 4/5 Left Hip ADduction: 4/5 Left Knee Flexion: 5/5 Left Knee Extension: 5/5  Mobility/Balance  Ambulation/Gait Ambulation/Gait: Yes Ambulation/Gait Assistance: 3: Mod assist Ambulation/Gait Assistance Details: l lateralpulsion Ambulation Distance (Feet): 100 Feet (2 min) Assistive device: Rolling walker Gait Pattern: Ataxic;Decreased hip/knee flexion - left Gait velocity: 0.83 ft/sec Stairs: Yes Stairs Assistance: 3: Mod assist Stair Management Technique: Two rails;Alternating pattern Number of Stairs: 3 Height of Stairs: 4 Berg Balance Test Sit to Stand: Able to stand  independently using hands Standing Unsupported: Able to stand 2 minutes with supervision Sitting with Back Unsupported but Feet Supported on Floor or Stool: Able to sit safely and securely 2 minutes Stand to Sit: Uses backs of legs against chair to control descent Transfers: Needs one person to assist Standing Unsupported with Eyes Closed: Unable to keep eyes closed 3 seconds but stays steady Standing Ubsupported with Feet Together: Able to place feet together independently but unable to hold for 30 seconds From Standing, Reach Forward with Outstretched Arm: Reaches forward but needs supervision From Standing Position, Pick up Object from Floor: Unable to pick up shoe, but reaches 2-5 cm (1-2") from shoe and balances independently From Standing Position, Turn to Look Behind Over each Shoulder: Needs supervision when turning Turn 360 Degrees: Needs assistance while turning Standing Unsupported, Alternately Place Feet on Step/Stool: Needs assistance to keep from falling or unable to try Standing  Unsupported, One Foot in Front: Able to take small step  independently and hold 30 seconds Standing on One Leg: Unable to try or needs assist to prevent fall Total Score: 22     Physical Therapy Assessment and Plan PT Assessment and Plan Clinical Impression Statement: Pt is a 52 year old male referred to PT for ataxic gait s/p cerebellar stroke on 02/20/12. Pt will benefit from skilled therapeutic intervention in order to improve on the following deficits: Abnormal gait;Decreased mobility;Decreased strength;Difficulty walking;Decreased balance;Decreased coordination Rehab Potential: Good PT Frequency: Min 2X/week PT Duration: 4 weeks PT Treatment/Interventions: Gait training;Therapeutic exercise;Balance training;Stair training;Patient/family education PT Plan: Continue with activities to improve static sitting and standing balance.  Progress pt to NBOS standing and then partial tandem.  Continue to address appropriate gait with weights on L LE to encourage knee flexion (may consider BWS TM system for greater caution).      Goals Home Exercise Program Pt will Perform Home Exercise Program: Independently PT Goal: Perform Home Exercise Program - Progress: Goal set today PT Short Term Goals Time to Complete Short Term Goals: 4 weeks PT Short Term Goal 1: Pt will improve his Berg to 32/56 to improve his overall static standing balance to decrease fall risk.   PT Short Term Goal 2: Pt will improve his gait speed to 1.5 ft/sec for improved safety with gait.  PT Short Term Goal 3: Pt will improve his LE functional strength and dynamic balance in order to perform 1 STS independent w/o UE A.  PT Short Term Goal 4: Pt will improve dynamic balance and ambulate safely w/LRAD in indoor and outdoor environment.    Problem List Patient Active Problem List  Diagnosis  . Acute respiratory failure  . Altered mental status  . Hypoxemia  . HTN (hypertension)  . ICH (intracerebral hemorrhage)  . 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 Headache  .  Cerebellar hemorrhage  . Ataxia  . Difficulty walking    PT - End of Session Equipment Utilized During Treatment: Gait belt PT Plan of Care PT Home Exercise Plan: see scanned report (seated and standing balance) PT Patient Instructions: provided Lubertha Basque number, discussed HEP, sent order to MD for OT orders to address UE coordination difficulty.   Segundo Makela, PT 03/20/2012, 12:21 PM  Physician Documentation Your signature is required to indicate approval of the treatment plan as stated above.  Please sign and either send electronically or make a copy of this report for your files and return this physician signed original.   Please mark one 1.__approve of plan  2. ___approve of plan with the following conditions.   ______________________________                                                          _____________________ Physician Signature  Date  

## 2012-03-27 ENCOUNTER — Inpatient Hospital Stay (HOSPITAL_COMMUNITY): Admission: RE | Admit: 2012-03-27 | Payer: Self-pay | Source: Ambulatory Visit | Admitting: Physical Therapy

## 2012-03-29 ENCOUNTER — Inpatient Hospital Stay (HOSPITAL_COMMUNITY): Admission: RE | Admit: 2012-03-29 | Payer: Self-pay | Source: Ambulatory Visit

## 2012-04-03 ENCOUNTER — Ambulatory Visit (HOSPITAL_COMMUNITY)
Admission: RE | Admit: 2012-04-03 | Discharge: 2012-04-03 | Disposition: A | Discharge status: Home / Self Care | Payer: Medicaid Other | Source: Ambulatory Visit | Attending: Family Medicine | Admitting: Family Medicine

## 2012-04-03 DIAGNOSIS — M6281 Muscle weakness (generalized): Secondary | ICD-10-CM | POA: Insufficient documentation

## 2012-04-03 DIAGNOSIS — R269 Unspecified abnormalities of gait and mobility: Secondary | ICD-10-CM | POA: Insufficient documentation

## 2012-04-03 DIAGNOSIS — I69993 Ataxia following unspecified cerebrovascular disease: Secondary | ICD-10-CM | POA: Insufficient documentation

## 2012-04-03 DIAGNOSIS — IMO0001 Reserved for inherently not codable concepts without codable children: Secondary | ICD-10-CM | POA: Insufficient documentation

## 2012-04-03 DIAGNOSIS — I10 Essential (primary) hypertension: Secondary | ICD-10-CM | POA: Insufficient documentation

## 2012-04-03 NOTE — Progress Notes (Signed)
Physical Therapy Treatment Patient Details  Name: Jason Strong MRN: [id number] Date of Birth: [birth year]  Today's Date: 04/03/2012 Time: 1350-1430 PT Time Calculation (min): 40 min Charge: NMR 38'  Visit#: 2 of 8  Re-eval: 04/19/12 Assessment Diagnosis: cerebellar stroke Surgical Date: 02/20/12 Next MD Visit: Dr. Modesto Charon 2/28  Subjective: Symptoms/Limitations Symptoms: Pt reported compliance with HEP daily.  Pain free today. Pain Assessment Currently in Pain?: No/denies  Precautions/Restrictions  Precautions Precautions: Fall  Exercise/Treatments Balance Exercises Standing Standing Eyes Opened: Narrow base of support (BOS);5 reps;Limitations Standing Eyes Opened Limitations: 3x 60" finding BOS, 1set 10x cervical flexion/extension, 10X cervical rotation RT/LT; 3x 10" with eyes shut Tandem Stance: Eyes open;3 reps;30 secs;Limitations Tandem Stance Limitations: weight bearing on L LE 3x 30" Sit to Stand: Limitations Sit to Stand Limitations: 5 times Other Standing Exercises: Rockerboard RT/LT 2X with intermittent HHA Other Standing Exercises: Gait training 316' w/ RW min A with cueing for equalized stride length and posture   Physical Therapy Assessment and Plan PT Assessment and Plan Clinical Impression Statement: Began POC for improved static standing balance and gait training with RW.  Pt with min assistance required with LOB episodes and vc-ing to imporove spatial awareness to reduce risk of falls. PT Plan: Continue with activities to improve static standing balance.  Next session begin BWS on TM for improved gait mechanics and activity tolerance.      Goals    Problem List Patient Active Problem List  Diagnosis  . Acute respiratory failure  . Altered mental status  . Hypoxemia  . HTN (hypertension)  . ICH (intracerebral hemorrhage)  . 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 Headache  . Cerebellar hemorrhage  . Ataxia  . Difficulty walking    PT - End  of Session Equipment Utilized During Treatment: Gait belt Activity Tolerance: Patient tolerated treatment well;Patient limited by fatigue General Behavior During Session: [facility] for tasks performed Cognition: [facility] for tasks performed  GP    Juel Burrow 04/03/2012, 3:34 PM

## 2012-04-05 ENCOUNTER — Ambulatory Visit (HOSPITAL_COMMUNITY): Payer: Self-pay | Admitting: Physical Therapy

## 2012-04-10 ENCOUNTER — Inpatient Hospital Stay (HOSPITAL_COMMUNITY): Admission: RE | Admit: 2012-04-10 | Payer: Self-pay | Source: Ambulatory Visit

## 2012-04-12 ENCOUNTER — Ambulatory Visit (HOSPITAL_COMMUNITY)
Admission: RE | Admit: 2012-04-12 | Discharge: 2012-04-12 | Disposition: A | Discharge status: Home / Self Care | Payer: Medicaid Other | Source: Ambulatory Visit | Attending: Family Medicine | Admitting: Family Medicine

## 2012-04-12 NOTE — Progress Notes (Signed)
Physical Therapy Treatment Patient Details  Name: BRENNER VISCONTI MRN: [id number] Date of Birth: 03-14-1960  Today's Date: 04/12/2012 Time: 2841-3244 PT Time Calculation (min): 23 min Charges: 15' TE, 8' NMR Visit#: 3 of 8  Re-eval: 04/19/12 Assessment Diagnosis: cerebellar stroke Surgical Date: 02/20/12 Next MD Visit: Dr. Modesto Charon 2/28  Authorization:    Authorization Time Period:    Authorization Visit#:   of     Subjective: Symptoms/Limitations Symptoms: Pt reports that he continues to have the greatest difficulty with balance and his endurance.  Pain Assessment Currently in Pain?: No/denies  Precautions/Restrictions  Precautions Precautions: Fall  Exercise/Treatments Aerobic Elliptical: NuStep: seat 7, 6 minutes resistance 3 Standing Knee Flexion: Both;15 reps Gait Training: max assit with 3lb weights around ankles 2x50 feet Seated Long Arc Quad: Both;10 reps;Weights;Limitations Long Arc Quad Weight: 3 lbs. Long [employer] Limitations: 5 sec holds Standing Sidestepping: Limitations Sidestepping Limitations: 2 round trips with 1 sitting rest break Cone Rotation: Solid surface;Right turn;Left turn;Limitations Cone Rotation Limitations: 10 reps w/o cones Sit to Stand Limitations: 5 times   Physical Therapy Assessment and Plan PT Assessment and Plan Clinical Impression Statement: Pt is 23 minutes late for apt today.  added 3# weights to legs to decrease ataxic movements, continues to have greatest difficulty with center of gravity and finding his BOS.  requires max assistance for walking.  PT Plan: Continue with activities to improve static standing balance.  Next session begin BWS on TM for improved gait mechanics and activity tolerance.      Goals Home Exercise Program Pt will Perform Home Exercise Program: Independently PT Goal: Perform Home Exercise Program - Progress: Progressing toward goal PT Short Term Goals Time to Complete Short Term Goals: 4 weeks PT  Short Term Goal 1: Pt will improve his Berg to 32/56 to improve his overall static standing balance to decrease fall risk.   PT Short Term Goal 1 - Progress: Progressing toward goal PT Short Term Goal 2: Pt will improve his gait speed to 1.5 ft/sec for improved safety with gait.  PT Short Term Goal 2 - Progress: Progressing toward goal PT Short Term Goal 3: Pt will improve his LE functional strength and dynamic balance in order to perform 1 STS independent w/o UE A.  PT Short Term Goal 3 - Progress: Progressing toward goal PT Short Term Goal 4: Pt will improve dynamic balance and ambulate safely w/LRAD in indoor and outdoor environment.   PT Short Term Goal 4 - Progress: Progressing toward goal  Problem List Patient Active Problem List  Diagnosis  . Acute respiratory failure  . Altered mental status  . Hypoxemia  . HTN (hypertension)  . ICH (intracerebral hemorrhage)  . 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 Headache  . Cerebellar hemorrhage  . Ataxia  . Difficulty walking    PT - End of Session Equipment Utilized During Treatment: Gait belt Activity Tolerance: Patient tolerated treatment well;Patient limited by fatigue General Behavior During Session: [facility] for tasks performed Cognition: [facility] for tasks performed  GP    Liza Czerwinski 04/12/2012, 1:54 PM

## 2012-04-13 ENCOUNTER — Ambulatory Visit: Payer: Self-pay | Admitting: Family Medicine

## 2012-04-23 ENCOUNTER — Ambulatory Visit: Payer: Self-pay | Admitting: Family Medicine

## 2012-04-24 ENCOUNTER — Ambulatory Visit: Payer: Self-pay | Admitting: Family Medicine

## 2012-05-04 ENCOUNTER — Ambulatory Visit: Payer: Self-pay | Admitting: Family Medicine

## 2012-05-30 ENCOUNTER — Emergency Department (HOSPITAL_COMMUNITY)
Admission: EM | Admit: 2012-05-30 | Discharge: 2012-05-31 | Disposition: A | Discharge status: Home / Self Care | Payer: Medicaid Other | Attending: Emergency Medicine | Admitting: Emergency Medicine

## 2012-05-30 ENCOUNTER — Encounter (HOSPITAL_COMMUNITY): Payer: Self-pay | Admitting: Emergency Medicine

## 2012-05-30 DIAGNOSIS — Z8711 Personal history of peptic ulcer disease: Secondary | ICD-10-CM | POA: Insufficient documentation

## 2012-05-30 DIAGNOSIS — Z79899 Other long term (current) drug therapy: Secondary | ICD-10-CM | POA: Insufficient documentation

## 2012-05-30 DIAGNOSIS — R220 Localized swelling, mass and lump, head: Secondary | ICD-10-CM | POA: Insufficient documentation

## 2012-05-30 DIAGNOSIS — R000 Tachycardia, unspecified: Secondary | ICD-10-CM | POA: Insufficient documentation

## 2012-05-30 DIAGNOSIS — F1010 Alcohol abuse, uncomplicated: Secondary | ICD-10-CM

## 2012-05-30 DIAGNOSIS — M6281 Muscle weakness (generalized): Secondary | ICD-10-CM | POA: Insufficient documentation

## 2012-05-30 DIAGNOSIS — R531 Weakness: Secondary | ICD-10-CM

## 2012-05-30 DIAGNOSIS — R6884 Jaw pain: Secondary | ICD-10-CM | POA: Insufficient documentation

## 2012-05-30 DIAGNOSIS — F17200 Nicotine dependence, unspecified, uncomplicated: Secondary | ICD-10-CM | POA: Insufficient documentation

## 2012-05-30 DIAGNOSIS — Z8673 Personal history of transient ischemic attack (TIA), and cerebral infarction without residual deficits: Secondary | ICD-10-CM | POA: Insufficient documentation

## 2012-05-30 DIAGNOSIS — K089 Disorder of teeth and supporting structures, unspecified: Secondary | ICD-10-CM | POA: Insufficient documentation

## 2012-05-30 DIAGNOSIS — K0889 Other specified disorders of teeth and supporting structures: Secondary | ICD-10-CM

## 2012-05-30 DIAGNOSIS — R221 Localized swelling, mass and lump, neck: Secondary | ICD-10-CM | POA: Insufficient documentation

## 2012-05-30 HISTORY — DX: Cerebral infarction, unspecified: I63.9

## 2012-05-30 LAB — CBC WITH DIFFERENTIAL/PLATELET
Basophils Relative: 0 % (ref 0–1)
Eosinophils Absolute: 0.2 10*3/uL (ref 0.0–0.7)
Eosinophils Relative: 2 % (ref 0–5)
HCT: 41.5 % (ref 39.0–52.0)
Hemoglobin: 14.6 g/dL (ref 13.0–17.0)
MCH: 31.9 pg (ref 26.0–34.0)
MCHC: 35.2 g/dL (ref 30.0–36.0)
Monocytes Absolute: 0.8 10*3/uL (ref 0.1–1.0)
Monocytes Relative: 7 % (ref 3–12)

## 2012-05-30 LAB — BASIC METABOLIC PANEL
BUN: 3 mg/dL — ABNORMAL LOW (ref 6–23)
Creatinine, Ser: 0.55 mg/dL (ref 0.50–1.35)
GFR calc Af Amer: >90 mL/min (ref >90)
GFR calc non Af Amer: >90 mL/min (ref >90)

## 2012-05-30 MED ORDER — ONDANSETRON 4 MG PO TBDP
4.0000 mg | ORAL_TABLET | Freq: Once | ORAL | Status: AC
  Administered 2012-05-30: 4 mg via ORAL
  Filled 2012-05-30: qty 1

## 2012-05-30 MED ORDER — DEXTROSE 5 % IV SOLN
1.0000 g | Freq: Once | INTRAVENOUS | Status: AC
  Administered 2012-05-30: 1 g via INTRAVENOUS
  Filled 2012-05-30: qty 10

## 2012-05-30 MED ORDER — SODIUM CHLORIDE 0.9 % IV BOLUS (SEPSIS)
1000.0000 mL | Freq: Once | INTRAVENOUS | Status: AC
  Administered 2012-05-30: 1000 mL via INTRAVENOUS

## 2012-05-30 MED ORDER — HYDROCODONE-ACETAMINOPHEN 5-325 MG PO TABS
2.0000 | ORAL_TABLET | Freq: Once | ORAL | Status: AC
  Administered 2012-05-30: 2 via ORAL
  Filled 2012-05-30: qty 2

## 2012-05-30 MED ORDER — CHLORDIAZEPOXIDE HCL 25 MG PO CAPS
25.0000 mg | ORAL_CAPSULE | Freq: Once | ORAL | Status: AC
  Administered 2012-05-30: 25 mg via ORAL
  Filled 2012-05-30: qty 1

## 2012-05-30 NOTE — ED Provider Notes (Signed)
History     CSN: [id number]  Arrival date & time 05/30/12  2037   First MD Initiated Contact with Patient 05/30/12 2202      Chief Complaint  Patient presents with  . Dental Pain  . Extremity Weakness    (Consider location/radiation/quality/duration/timing/severity/associated sxs/prior treatment) Patient is a 52 y.o. male presenting with tooth pain and extremity weakness. The history is provided by the patient.  Dental PainThe primary symptoms include mouth pain. Primary symptoms do not include shortness of breath or cough. The symptoms began more than 1 month ago. The symptoms are worsening. The symptoms occur constantly.  Additional symptoms include: gum swelling and jaw pain. Additional symptoms do not include: trouble swallowing and nosebleeds. Medical issues include: alcohol problem.   Extremity Weakness Pertinent negatives include no abdominal pain, arthralgias, chest pain, coughing or neck pain.    Past Medical History  Diagnosis Date  . History of stomach ulcers   . Stroke     Past Surgical History  Procedure Laterality Date  . Abdominal surgery    . Craniectomy  02/20/2012    Procedure: CRANIECTOMY POSTERIOR FOSSA DECOMPRESSION;  Surgeon: Temple Pacini, MD;  Location: [facility];  Service: Neurosurgery;  Laterality: N/A;  Craniectomy Posterior Fossa Decompression     History reviewed. No pertinent family history.  History  Substance Use Topics  . Smoking status: Current Every Day Smoker -- 2.00 packs/day for 15 years    Types: Cigarettes  . Smokeless tobacco: Not on file  . Alcohol Use: 25.2 oz/week    42 Cans of beer per week      Review of Systems  Constitutional: Negative for activity change.       All ROS Neg except as noted in HPI  HENT: Positive for dental problem. Negative for nosebleeds, trouble swallowing and neck pain.   Eyes: Negative for photophobia and discharge.  Respiratory: Negative for cough, shortness of breath and wheezing.    Cardiovascular: Negative for chest pain and palpitations.  Gastrointestinal: Negative for abdominal pain and blood in stool.  Genitourinary: Negative for dysuria, frequency and hematuria.  Musculoskeletal: Positive for extremity weakness. Negative for back pain and arthralgias.  Skin: Negative.   Neurological: Negative for dizziness, seizures and speech difficulty.  Psychiatric/Behavioral: Negative for hallucinations and confusion.    Allergies  Review of patient's allergies indicates no known allergies.  Home Medications   Current Outpatient Rx  Name  Route  Sig  Dispense  Refill  . lisinopril (PRINIVIL,ZESTRIL) 20 MG tablet   Oral   Take 1 tablet (20 mg total) by mouth daily.   15 tablet   0   . omeprazole (PRILOSEC) 20 MG capsule   Oral   Take 20 mg by mouth daily as needed. For heart burn           BP 120/78  Pulse 101  Temp(Src) 98.5 F (36.9 C) (Oral)  Resp 18  Ht 5\' 8"  (1.727 m)  Wt 120 lb (54.432 kg)  BMI 18.25 kg/m2  SpO2 98%  Physical Exam  Nursing note and vitals reviewed. Constitutional: He is oriented to person, place, and time. He appears well-developed and well-nourished.  Non-toxic appearance.  HENT:  Head: Normocephalic.  Right Ear: Tympanic membrane and external ear normal.  Left Ear: Tympanic membrane and external ear normal.  Mucous membranes dry. Multiple severely decayed teeth of the upper an lower gum.  Eyes: EOM and lids are normal. Pupils are equal, round, and reactive to light.  Neck:  Normal range of motion. Neck supple. Carotid bruit is not present.  Cardiovascular: Regular rhythm, normal heart sounds, intact distal pulses and normal pulses.  Tachycardia present.   Pulmonary/Chest: Breath sounds normal. No respiratory distress.  Abdominal: Soft. Bowel sounds are normal. There is no tenderness. There is no guarding.  Musculoskeletal: Normal range of motion.  Lymphadenopathy:       Head (right side): No submandibular adenopathy  present.       Head (left side): No submandibular adenopathy present.    He has no cervical adenopathy.  Neurological: He is alert and oriented to person, place, and time. He has normal strength. No cranial nerve deficit or sensory deficit.  Skin: Skin is warm and dry.  Psychiatric: He has a normal mood and affect. His speech is normal.    ED Course  Procedures (including critical care time)  Labs Reviewed - No data to display No results found.   No diagnosis found.    MDM  I have reviewed nursing notes, vital signs, and all appropriate lab and imaging results for this patient. Patient has several decayed teeth. He complains of pain of the gums and some blistering present. He states he's not really been able to eat well for the past 4 days. Patient complained of weakness. It is of note that the patient has an appointment with his dentist on tomorrow morning May 8.  Patient treated with IV fluids. As well as IV Rocephin. The patient and family gives history that he has been drinking alcohol almost daily. The patient was given Librium as he has not had any alcohol today.  At discharge patient was ambulatory, states he felt much better and did not feel nearly as weak. Patient is to see his dentist tomorrow morning as scheduled.       Kathie Dike, PA-C 06/01/12 1910

## 2012-05-30 NOTE — ED Notes (Signed)
Pt c/o left side dental pain since Saturday. Pt has dentist appointment scheduled tomorrow but states pain is too bad to wait. Pt states he has not eaten since Saturday due to loss of appetite. Pt c/o generalized weakness.

## 2012-05-30 NOTE — ED Notes (Signed)
Pt states he has blisters in his mouth x 4 days and has not been eating. Pt c/o weakness and states he has a dentist appt in am.

## 2012-06-02 NOTE — ED Provider Notes (Signed)
Medical screening examination/treatment/procedure(s) were performed by non-physician practitioner and as supervising physician I was immediately available for consultation/collaboration.   Sadee Osland L Allah Reason, MD 06/02/12 1248 

## 2012-06-07 ENCOUNTER — Ambulatory Visit (INDEPENDENT_AMBULATORY_CARE_PROVIDER_SITE_OTHER): Payer: Medicaid Other | Admitting: Family Medicine

## 2012-06-07 ENCOUNTER — Encounter: Payer: Self-pay | Admitting: Family Medicine

## 2012-06-07 VITALS — BP 127/90 | HR 115 | Temp 97.2°F

## 2012-06-07 DIAGNOSIS — I619 Nontraumatic intracerebral hemorrhage, unspecified: Secondary | ICD-10-CM

## 2012-06-07 DIAGNOSIS — R262 Difficulty in walking, not elsewhere classified: Secondary | ICD-10-CM

## 2012-06-07 DIAGNOSIS — R279 Unspecified lack of coordination: Secondary | ICD-10-CM

## 2012-06-07 DIAGNOSIS — Z862 Personal history of diseases of the blood and blood-forming organs and certain disorders involving the immune mechanism: Secondary | ICD-10-CM

## 2012-06-07 DIAGNOSIS — R270 Ataxia, unspecified: Secondary | ICD-10-CM

## 2012-06-07 DIAGNOSIS — I10 Essential (primary) hypertension: Secondary | ICD-10-CM

## 2012-06-07 DIAGNOSIS — F17200 Nicotine dependence, unspecified, uncomplicated: Secondary | ICD-10-CM | POA: Insufficient documentation

## 2012-06-07 DIAGNOSIS — E559 Vitamin D deficiency, unspecified: Secondary | ICD-10-CM

## 2012-06-07 DIAGNOSIS — K219 Gastro-esophageal reflux disease without esophagitis: Secondary | ICD-10-CM | POA: Insufficient documentation

## 2012-06-07 LAB — COMPLETE METABOLIC PANEL WITH GFR
ALT: 13 U/L (ref 0–53)
AST: 19 U/L (ref 0–37)
Albumin: 3.5 g/dL (ref 3.5–5.2)
Alkaline Phosphatase: 107 U/L (ref 39–117)
BUN: 3 mg/dL — ABNORMAL LOW (ref 6–23)
CO2: 24 mEq/L (ref 19–32)
Calcium: 9.5 mg/dL (ref 8.4–10.5)
Chloride: 102 mEq/L (ref 96–112)
Creat: 0.67 mg/dL (ref 0.50–1.35)
GFR, Est African American: >89 mL/min
GFR, Est Non African American: >89 mL/min
Glucose, Bld: 123 mg/dL — ABNORMAL HIGH (ref 70–99)
Potassium: 4.3 mEq/L (ref 3.5–5.3)
Sodium: 138 mEq/L (ref 135–145)
Total Bilirubin: 0.5 mg/dL (ref 0.3–1.2)
Total Protein: 6.6 g/dL (ref 6.0–8.3)

## 2012-06-07 LAB — POCT CBC
Granulocyte percent: 84.6 %G — AB (ref 37–80)
HCT, POC: 43.3 % — AB (ref 43.5–53.7)
Hemoglobin: 14.7 g/dL (ref 14.1–18.1)
Lymph, poc: 1.8 (ref 0.6–3.4)
MCH, POC: 32.1 pg — AB (ref 27–31.2)
MCHC: 34 g/dL (ref 31.8–35.4)
MCV: 94.5 fL (ref 80–97)
MPV: 8.7 fL (ref 0–99.8)
POC Granulocyte: 10.7 — AB (ref 2–6.9)
POC LYMPH PERCENT: 14.3 %L (ref 10–50)
Platelet Count, POC: 281 10*3/uL (ref 142–424)
RBC: 4.6 M/uL — AB (ref 4.69–6.13)
RDW, POC: 15.9 %
WBC: 12.7 10*3/uL — AB (ref 4.6–10.2)

## 2012-06-07 LAB — FOLATE: Folate: 4.3 ng/mL

## 2012-06-07 LAB — TSH: TSH: 0.9 u[IU]/mL (ref 0.350–4.500)

## 2012-06-07 LAB — VITAMIN B12: Vitamin B-12: 416 pg/mL (ref 211–911)

## 2012-06-07 NOTE — Progress Notes (Signed)
Patient ID: Jason Strong, male   DOB: [birth year], 52 y.o.   MRN: [id number] SUBJECTIVE: HPI: Patient is here for follow up of hypertension/s/p CVA/tobacco abuse ongoing/occassional Beer drinking/ Ataxia post Intracranial hemorrhage.  denies Headache;deniesChest Pain;denies weakness;denies Shortness of Breath or Orthopnea;denies Visual changes;denies palpitations;denies cough;denies pedal edema;denies new symptoms of TIA or stroke; admits to Compliance with medications. denies Problems with medications.   PMH/PSH: reviewed/updated in Epic  SH/FH: reviewed/updated in Epic  Allergies: reviewed/updated in Epic  Medications: reviewed/updated in Epic  Immunizations: reviewed/updated in Epic  ROS: As above in the HPI. All other systems are stable or negative.  OBJECTIVE: APPEARANCE:  In a wheelchair. Patient in no acute distress.The patient appeared well nourished and normally developed. Acyanotic. Waist: VITAL SIGNS:BP 127/90  Pulse 115  Temp(Src) 97.2 F (36.2 C) (Oral)   SKIN: warm and  Dry without overt rashes, tattoos and scars  HEAD and Neck: without JVD, Head and scalp: normal Eyes:No scleral icterus. Fundi normal, eye movements normal. Ears: Auricle normal, canal normal, Tympanic membranes normal, insufflation normal. Nose: normal Throat: normal Neck & thyroid: normal  CHEST & LUNGS: Chest wall: normal Lungs: Clear  CVS: Reveals the PMI to be normally located. Regular rhythm, First and Second Heart sounds are normal,  absence of murmurs, rubs or gallops. Peripheral vasculature: Radial pulses: normal Dorsal pedis pulses: normal Posterior pulses: normal  ABDOMEN:  Appearance: normal Benign,, no organomegaly, no masses, no Abdominal Aortic enlargement. No Guarding , no rebound. No Bruits. Bowel sounds: normal  RECTAL: N/A GU: N/A  EXTREMETIES: nonedematous.  NEUROLOGIC: oriented to time,place and person; nonfocal in extremities. Ataxia on  attempting to stand and walk  ASSESSMENT: ICH (intracerebral hemorrhage) - Plan: NMR Lipoprofile with Lipids  HTN (hypertension) - Plan: COMPLETE METABOLIC PANEL WITH GFR, NMR Lipoprofile with Lipids  Difficulty walking  Ataxia - Plan: NMR Lipoprofile with Lipids, TSH, Vitamin B12, Folate, CANCELED: POCT CBC  Tobacco use disorder  GERD (gastroesophageal reflux disease)  Unspecified vitamin D deficiency  Vitamin D deficiency - Plan: Vitamin D 25 hydroxy  H/O leukocytosis - Plan: POCT CBC   PLAN: Orders Placed This Encounter  Procedures  . COMPLETE METABOLIC PANEL WITH GFR  . NMR Lipoprofile with Lipids  . TSH  . Vitamin B12  . Folate  . Vitamin D 25 hydroxy  . POCT CBC   Results for orders placed in visit on 06/07/12  POCT CBC      Result Value Range   WBC 12.7 (*) 4.6 - 10.2 K/uL   Lymph, poc 1.8  0.6 - 3.4   POC LYMPH PERCENT 14.3  10 - 50 %L   POC Granulocyte 10.7 (*) 2 - 6.9   Granulocyte percent 84.6 (*) 37 - 80 %G   RBC 4.6 (*) 4.69 - 6.13 M/uL   Hemoglobin 14.7  14.1 - 18.1 g/dL   HCT, POC 40.9 (*) 81.1 - 53.7 %   MCV 94.5  80 - 97 fL   MCH, POC 32.1 (*) 27 - 31.2 pg   MCHC 34.0  31.8 - 35.4 g/dL   RDW, POC 91.4     Platelet Count, POC 281.0  142 - 424 K/uL   MPV 8.7  0 - 99.8 fL   No orders of the defined types were placed in this encounter.   Continue with PT  Smoking cessation and abstain from ETOH. counselled at length and encouraged to change his lifestyle.  RTC 3 months.  Mikia Delaluz P. Modesto Charon, M.D.

## 2012-06-07 NOTE — Patient Instructions (Signed)
Smoking Cessation Quitting smoking is important to your health and has many advantages. However, it is not always easy to quit since nicotine is a very addictive drug. Often times, people try 3 times or more before being able to quit. This document explains the best ways for you to prepare to quit smoking. Quitting takes hard work and a lot of effort, but you can do it. ADVANTAGES OF QUITTING SMOKING  You will live longer, feel better, and live better.  Your body will feel the impact of quitting smoking almost immediately.  Within 20 minutes, blood pressure decreases. Your pulse returns to its normal level.  After 8 hours, carbon monoxide levels in the blood return to normal. Your oxygen level increases.  After 24 hours, the chance of having a heart attack starts to decrease. Your breath, hair, and body stop smelling like smoke.  After 48 hours, damaged nerve endings begin to recover. Your sense of taste and smell improve.  After 72 hours, the body is virtually free of nicotine. Your bronchial tubes relax and breathing becomes easier.  After 2 to 12 weeks, lungs can hold more air. Exercise becomes easier and circulation improves.  The risk of having a heart attack, stroke, cancer, or lung disease is greatly reduced.  After 1 year, the risk of coronary heart disease is cut in half.  After 5 years, the risk of stroke falls to the same as a nonsmoker.  After 10 years, the risk of lung cancer is cut in half and the risk of other cancers decreases significantly.  After 15 years, the risk of coronary heart disease drops, usually to the level of a nonsmoker.  If you are pregnant, quitting smoking will improve your chances of having a healthy baby.  The people you live with, especially any children, will be healthier.  You will have extra money to spend on things other than cigarettes. QUESTIONS TO THINK ABOUT BEFORE ATTEMPTING TO QUIT You may want to talk about your answers with your  caregiver.  Why do you want to quit?  If you tried to quit in the past, what helped and what did not?  What will be the most difficult situations for you after you quit? How will you plan to handle them?  Who can help you through the tough times? Your family? Friends? A caregiver?  What pleasures do you get from smoking? What ways can you still get pleasure if you quit? Here are some questions to ask your caregiver:  How can you help me to be successful at quitting?  What medicine do you think would be best for me and how should I take it?  What should I do if I need more help?  What is smoking withdrawal like? How can I get information on withdrawal? GET READY  Set a quit date.  Change your environment by getting rid of all cigarettes, ashtrays, matches, and lighters in your home, car, or work. Do not let people smoke in your home.  Review your past attempts to quit. Think about what worked and what did not. GET SUPPORT AND ENCOURAGEMENT You have a better chance of being successful if you have help. You can get support in many ways.  Tell your family, friends, and co-workers that you are going to quit and need their support. Ask them not to smoke around you.  Get individual, group, or telephone counseling and support. Programs are available at local hospitals and health centers. Call your local health department for   information about programs in your area.  Spiritual beliefs and practices may help some smokers quit.  Download a "quit meter" on your computer to keep track of quit statistics, such as how long you have gone without smoking, cigarettes not smoked, and money saved.  Get a self-help book about quitting smoking and staying off of tobacco. LEARN NEW SKILLS AND BEHAVIORS  Distract yourself from urges to smoke. Talk to someone, go for a walk, or occupy your time with a task.  Change your normal routine. Take a different route to work. Drink tea instead of coffee.  Eat breakfast in a different place.  Reduce your stress. Take a hot bath, exercise, or read a book.  Plan something enjoyable to do every day. Reward yourself for not smoking.  Explore interactive web-based programs that specialize in helping you quit. GET MEDICINE AND USE IT CORRECTLY Medicines can help you stop smoking and decrease the urge to smoke. Combining medicine with the above behavioral methods and support can greatly increase your chances of successfully quitting smoking.  Nicotine replacement therapy helps deliver nicotine to your body without the negative effects and risks of smoking. Nicotine replacement therapy includes nicotine gum, lozenges, inhalers, nasal sprays, and skin patches. Some may be available over-the-counter and others require a prescription.  Antidepressant medicine helps people abstain from smoking, but how this works is unknown. This medicine is available by prescription.  Nicotinic receptor partial agonist medicine simulates the effect of nicotine in your brain. This medicine is available by prescription. Ask your caregiver for advice about which medicines to use and how to use them based on your health history. Your caregiver will tell you what side effects to look out for if you choose to be on a medicine or therapy. Carefully read the information on the package. Do not use any other product containing nicotine while using a nicotine replacement product.  RELAPSE OR DIFFICULT SITUATIONS Most relapses occur within the first 3 months after quitting. Do not be discouraged if you start smoking again. Remember, most people try several times before finally quitting. You may have symptoms of withdrawal because your body is used to nicotine. You may crave cigarettes, be irritable, feel very hungry, cough often, get headaches, or have difficulty concentrating. The withdrawal symptoms are only temporary. They are strongest when you first quit, but they will go away within  10 14 days. To reduce the chances of relapse, try to:  Avoid drinking alcohol. Drinking lowers your chances of successfully quitting.  Reduce the amount of caffeine you consume. Once you quit smoking, the amount of caffeine in your body increases and can give you symptoms, such as a rapid heartbeat, sweating, and anxiety.  Avoid smokers because they can make you want to smoke.  Do not let weight gain distract you. Many smokers will gain weight when they quit, usually less than 10 pounds. Eat a healthy diet and stay active. You can always lose the weight gained after you quit.  Find ways to improve your mood other than smoking. FOR MORE INFORMATION  www.smokefree.gov  Document Released: 01/04/2001 Document Revised: 07/12/2011 Document Reviewed: 04/21/2011 [employer] Patient Information 2013 [employer].  

## 2012-06-08 LAB — VITAMIN D 25 HYDROXY (VIT D DEFICIENCY, FRACTURES): Vit D, 25-Hydroxy: 51 ng/mL (ref 30–89)

## 2012-06-14 LAB — NMR LIPOPROFILE WITH LIPIDS
Cholesterol, Total: 90 mg/dL (ref <200)
HDL Particle Number: 23 umol/L — ABNORMAL LOW (ref >=30.5)
HDL Size: 9.3 nm (ref >=9.2)
HDL-C: 31 mg/dL — ABNORMAL LOW (ref >=40)
LDL (calc): 25 mg/dL (ref <100)
LDL Particle Number: 728 nmol/L (ref <1000)
LDL Size: 19.8 nm — ABNORMAL LOW (ref >20.5)
LP-IR Score: 60 — ABNORMAL HIGH (ref <=45)
Large HDL-P: 4 umol/L — ABNORMAL LOW (ref >=4.8)
Large VLDL-P: 7.9 nmol/L — ABNORMAL HIGH (ref <=2.7)
Small LDL Particle Number: 455 nmol/L (ref <=527)
Triglycerides: 171 mg/dL — ABNORMAL HIGH (ref <150)
VLDL Size: 52.8 nm — ABNORMAL HIGH (ref <=46.6)

## 2012-06-16 ENCOUNTER — Other Ambulatory Visit: Payer: Self-pay | Admitting: Family Medicine

## 2012-06-16 DIAGNOSIS — D72829 Elevated white blood cell count, unspecified: Secondary | ICD-10-CM

## 2012-06-16 DIAGNOSIS — R739 Hyperglycemia, unspecified: Secondary | ICD-10-CM

## 2012-06-16 NOTE — Progress Notes (Signed)
Quick Note:  Labs abnormal.the WBC is Still elevated and the sugar is As well. Needs to stop smoking.  Refer to hematologist. Needs a HGBA1C to check for DM. I Ordered in EPIC ______

## 2012-06-19 ENCOUNTER — Inpatient Hospital Stay (HOSPITAL_COMMUNITY): Payer: Medicaid Other

## 2012-06-19 ENCOUNTER — Emergency Department (HOSPITAL_COMMUNITY): Payer: Medicaid Other

## 2012-06-19 ENCOUNTER — Telehealth: Payer: Self-pay | Admitting: Family Medicine

## 2012-06-19 ENCOUNTER — Inpatient Hospital Stay (HOSPITAL_COMMUNITY)
Admission: EM | Admit: 2012-06-19 | Discharge: 2012-06-22 | DRG: 175 | Disposition: A | Discharge status: Home Health | Payer: Medicaid Other | Attending: Internal Medicine | Admitting: Internal Medicine

## 2012-06-19 ENCOUNTER — Encounter (HOSPITAL_COMMUNITY): Payer: Self-pay | Admitting: Adult Health

## 2012-06-19 DIAGNOSIS — J189 Pneumonia, unspecified organism: Secondary | ICD-10-CM | POA: Diagnosis present

## 2012-06-19 DIAGNOSIS — D72829 Elevated white blood cell count, unspecified: Secondary | ICD-10-CM | POA: Diagnosis present

## 2012-06-19 DIAGNOSIS — I10 Essential (primary) hypertension: Secondary | ICD-10-CM | POA: Diagnosis present

## 2012-06-19 DIAGNOSIS — E43 Unspecified severe protein-calorie malnutrition: Secondary | ICD-10-CM | POA: Diagnosis present

## 2012-06-19 DIAGNOSIS — F17200 Nicotine dependence, unspecified, uncomplicated: Secondary | ICD-10-CM | POA: Diagnosis present

## 2012-06-19 DIAGNOSIS — E860 Dehydration: Secondary | ICD-10-CM | POA: Diagnosis present

## 2012-06-19 DIAGNOSIS — Z8711 Personal history of peptic ulcer disease: Secondary | ICD-10-CM

## 2012-06-19 DIAGNOSIS — I619 Nontraumatic intracerebral hemorrhage, unspecified: Secondary | ICD-10-CM

## 2012-06-19 DIAGNOSIS — K149 Disease of tongue, unspecified: Secondary | ICD-10-CM | POA: Diagnosis present

## 2012-06-19 DIAGNOSIS — Z79899 Other long term (current) drug therapy: Secondary | ICD-10-CM

## 2012-06-19 DIAGNOSIS — I2699 Other pulmonary embolism without acute cor pulmonale: Principal | ICD-10-CM | POA: Diagnosis present

## 2012-06-19 DIAGNOSIS — R599 Enlarged lymph nodes, unspecified: Secondary | ICD-10-CM | POA: Diagnosis present

## 2012-06-19 DIAGNOSIS — R000 Tachycardia, unspecified: Secondary | ICD-10-CM

## 2012-06-19 DIAGNOSIS — F1010 Alcohol abuse, uncomplicated: Secondary | ICD-10-CM | POA: Diagnosis present

## 2012-06-19 DIAGNOSIS — Z8673 Personal history of transient ischemic attack (TIA), and cerebral infarction without residual deficits: Secondary | ICD-10-CM

## 2012-06-19 DIAGNOSIS — K148 Other diseases of tongue: Secondary | ICD-10-CM | POA: Diagnosis present

## 2012-06-19 DIAGNOSIS — K219 Gastro-esophageal reflux disease without esophagitis: Secondary | ICD-10-CM | POA: Diagnosis present

## 2012-06-19 HISTORY — DX: Gastro-esophageal reflux disease without esophagitis: K21.9

## 2012-06-19 HISTORY — DX: Nontraumatic intracerebral hemorrhage, unspecified: I61.9

## 2012-06-19 HISTORY — DX: Essential (primary) hypertension: I10

## 2012-06-19 LAB — URINALYSIS, ROUTINE W REFLEX MICROSCOPIC
Glucose, UA: NEGATIVE mg/dL
Hgb urine dipstick: NEGATIVE
Ketones, ur: NEGATIVE mg/dL
Protein, ur: NEGATIVE mg/dL
Urobilinogen, UA: 0.2 mg/dL (ref 0.0–1.0)

## 2012-06-19 LAB — POCT I-STAT TROPONIN I: Troponin i, poc: 0 ng/mL (ref 0.00–0.08)

## 2012-06-19 LAB — CBC
HCT: 42.9 % (ref 39.0–52.0)
Hemoglobin: 15.3 g/dL (ref 13.0–17.0)
MCH: 32.2 pg (ref 26.0–34.0)
MCHC: 35.7 g/dL (ref 30.0–36.0)
RBC: 4.75 MIL/uL (ref 4.22–5.81)

## 2012-06-19 LAB — COMPREHENSIVE METABOLIC PANEL
Alkaline Phosphatase: 112 U/L (ref 39–117)
BUN: 5 mg/dL — ABNORMAL LOW (ref 6–23)
CO2: 22 mEq/L (ref 19–32)
Calcium: 10 mg/dL (ref 8.4–10.5)
GFR calc Af Amer: >90 mL/min (ref >90)
GFR calc non Af Amer: >90 mL/min (ref >90)
Glucose, Bld: 139 mg/dL — ABNORMAL HIGH (ref 70–99)
Total Protein: 7.6 g/dL (ref 6.0–8.3)

## 2012-06-19 LAB — GLUCOSE, CAPILLARY
Glucose-Capillary: 165 mg/dL — ABNORMAL HIGH (ref 70–99)
Glucose-Capillary: 126 mg/dL — ABNORMAL HIGH (ref 70–99)

## 2012-06-19 LAB — D-DIMER, QUANTITATIVE: D-Dimer, Quant: 1.36 ug/mL-FEU — ABNORMAL HIGH (ref 0.00–0.48)

## 2012-06-19 LAB — PROTIME-INR: Prothrombin Time: 11.8 seconds (ref 11.6–15.2)

## 2012-06-19 LAB — CG4 I-STAT (LACTIC ACID): Lactic Acid, Venous: 2.28 mmol/L — ABNORMAL HIGH (ref 0.5–2.2)

## 2012-06-19 MED ORDER — LORAZEPAM 1 MG PO TABS
1.0000 mg | ORAL_TABLET | Freq: Four times a day (QID) | ORAL | Status: DC | PRN
  Administered 2012-06-20 – 2012-06-22 (×4): 1 mg via ORAL
  Filled 2012-06-19 (×4): qty 1

## 2012-06-19 MED ORDER — IOHEXOL 300 MG/ML  SOLN
25.0000 mL | INTRAMUSCULAR | Status: DC
  Administered 2012-06-19: 25 mL via ORAL

## 2012-06-19 MED ORDER — ONDANSETRON HCL 4 MG/2ML IJ SOLN: 4.0000 mg | Freq: Three times a day (TID) | INTRAMUSCULAR | Status: DC | PRN

## 2012-06-19 MED ORDER — ACETAMINOPHEN 325 MG PO TABS: 650.0000 mg | ORAL_TABLET | Freq: Four times a day (QID) | ORAL | Status: DC | PRN

## 2012-06-19 MED ORDER — SODIUM CHLORIDE 0.9 % IV SOLN
INTRAVENOUS | Status: AC
  Administered 2012-06-19: 20:00:00 via INTRAVENOUS

## 2012-06-19 MED ORDER — OXYCODONE-ACETAMINOPHEN 5-325 MG PO TABS
1.0000 | ORAL_TABLET | ORAL | Status: DC | PRN
  Administered 2012-06-20 – 2012-06-22 (×3): 1 via ORAL
  Filled 2012-06-19 (×3): qty 1

## 2012-06-19 MED ORDER — VITAMIN B-1 100 MG PO TABS
100.0000 mg | ORAL_TABLET | Freq: Every day | ORAL | Status: DC
  Administered 2012-06-20 – 2012-06-22 (×3): 100 mg via ORAL
  Filled 2012-06-19 (×3): qty 1

## 2012-06-19 MED ORDER — LORAZEPAM 2 MG/ML IJ SOLN: 1.0000 mg | Freq: Four times a day (QID) | INTRAMUSCULAR | Status: DC | PRN

## 2012-06-19 MED ORDER — ONDANSETRON HCL 4 MG/2ML IJ SOLN: 4.0000 mg | Freq: Four times a day (QID) | INTRAMUSCULAR | Status: DC | PRN

## 2012-06-19 MED ORDER — LORAZEPAM 2 MG/ML IJ SOLN: 0.0000 mg | Freq: Four times a day (QID) | INTRAMUSCULAR | Status: AC

## 2012-06-19 MED ORDER — MAGIC MOUTHWASH W/LIDOCAINE
15.0000 mL | Freq: Four times a day (QID) | ORAL | Status: DC | PRN
  Administered 2012-06-20 – 2012-06-22 (×2): 15 mL via ORAL
  Filled 2012-06-19 (×3): qty 15

## 2012-06-19 MED ORDER — SODIUM CHLORIDE 0.9 % IJ SOLN
3.0000 mL | Freq: Two times a day (BID) | INTRAMUSCULAR | Status: DC
  Administered 2012-06-20: 22:00:00 via INTRAVENOUS
  Administered 2012-06-20 – 2012-06-22 (×3): 3 mL via INTRAVENOUS

## 2012-06-19 MED ORDER — SODIUM CHLORIDE 0.9 % IV SOLN
INTRAVENOUS | Status: AC
  Administered 2012-06-20: via INTRAVENOUS

## 2012-06-19 MED ORDER — ACETAMINOPHEN 650 MG RE SUPP: 650.0000 mg | Freq: Four times a day (QID) | RECTAL | Status: DC | PRN

## 2012-06-19 MED ORDER — HYDRALAZINE HCL 20 MG/ML IJ SOLN: 10.0000 mg | INTRAMUSCULAR | Status: DC | PRN

## 2012-06-19 MED ORDER — ADULT MULTIVITAMIN W/MINERALS CH
1.0000 | ORAL_TABLET | Freq: Every day | ORAL | Status: DC
  Administered 2012-06-20 – 2012-06-22 (×3): 1 via ORAL
  Filled 2012-06-19 (×3): qty 1

## 2012-06-19 MED ORDER — ONDANSETRON HCL 4 MG PO TABS: 4.0000 mg | ORAL_TABLET | Freq: Four times a day (QID) | ORAL | Status: DC | PRN

## 2012-06-19 MED ORDER — SODIUM CHLORIDE 0.9 % IV BOLUS (SEPSIS)
1000.0000 mL | Freq: Once | INTRAVENOUS | Status: AC
  Administered 2012-06-19: 1000 mL via INTRAVENOUS

## 2012-06-19 MED ORDER — LORAZEPAM 2 MG/ML IJ SOLN: 0.0000 mg | Freq: Two times a day (BID) | INTRAMUSCULAR | Status: DC

## 2012-06-19 MED ORDER — FOLIC ACID 1 MG PO TABS
1.0000 mg | ORAL_TABLET | Freq: Every day | ORAL | Status: DC
  Administered 2012-06-20 – 2012-06-22 (×3): 1 mg via ORAL
  Filled 2012-06-19 (×3): qty 1

## 2012-06-19 MED ORDER — THIAMINE HCL 100 MG/ML IJ SOLN
100.0000 mg | Freq: Every day | INTRAMUSCULAR | Status: DC
  Filled 2012-06-19 (×3): qty 1

## 2012-06-19 MED ORDER — IOHEXOL 350 MG/ML SOLN
100.0000 mL | Freq: Once | INTRAVENOUS | Status: AC | PRN
  Administered 2012-06-19: 100 mL via INTRAVENOUS

## 2012-06-19 NOTE — ED Notes (Signed)
Pt's CBG is 126. RN notified.

## 2012-06-19 NOTE — H&P (Addendum)
[facility] History and Physical  Jason Strong ZOX:[id number] DOB: 01-26-1960 DOA: 06/19/2012  Referring physician: ER physician. PCP: Redmond Baseman, MD   Chief Complaint: Referred by PCP for abnormal labs.  HPI: Jason Strong is a 52 y.o. 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 Patient's labs were showing leukocytosis. Patient in the ER was found to be tachycardic with mildly elevated lactic acid levels. His labs done in the ER showed leukocytosis. Patient has not been eating well for last one month because of pain in the tongue due to ulcer. Patient has been having an ulcer notice for last few weeks and has been referred to oral surgeon. Patient has been not eating well for last few weeks. Denies any nausea vomiting chest pain productive cough fever chills abdominal pain diarrhea. Does complain of weight loss over the last few weeks. In the ER patient was on IV fluids. CT head was negative for any acute. CT angiogram of the chest was ordered because of mildly elevated d-dimer. CT abdomen and pelvis also has been ordered.  Review of Systems: As presented in the history of presenting illness, rest negative.  Past Medical History  Diagnosis Date  . History of stomach ulcers   . Stroke    Past Surgical History  Procedure Laterality Date  . Abdominal surgery    . Craniectomy  02/20/2012    Procedure: CRANIECTOMY POSTERIOR FOSSA DECOMPRESSION;  Surgeon: Temple Pacini, MD;  Location: [facility];  Service: Neurosurgery;  Laterality: N/A;  Craniectomy Posterior Fossa Decompression    Social History:  reports that he has been smoking Cigarettes.  He has a 30 pack-year smoking history. He does not have any smokeless tobacco history on file. He reports that he drinks about 25.2 ounces of alcohol per week. His drug history is  not on file. Lives at home. where does patient live-- Can do ADLs. Can patient participate in ADLs?  No Known Allergies  Family History  Problem Relation Age of Onset  . Cervical cancer Mother   . Stroke Father       Prior to Admission medications   Medication Sig Start Date End Date Taking? Authorizing Provider  Cholecalciferol (VITAMIN D-3 PO) Take 1 capsule by mouth daily.   Yes Historical Provider, MD  lisinopril (PRINIVIL,ZESTRIL) 20 MG tablet Take 1 tablet (20 mg total) by mouth daily. 02/29/12  Yes Peter S Dammen, PA-C  omeprazole (PRILOSEC) 20 MG capsule Take 20 mg by mouth daily as needed. For heart burn   Yes Historical Provider, MD   Physical Exam: Filed Vitals:   06/19/12 1910 06/19/12 2013 06/19/12 2030 06/19/12 2107  BP: 115/80 127/82 123/87 128/90  Pulse: 105 108 92 104  Temp:    98.1 F (36.7 C)  TempSrc:    Oral  Resp: 22 14 18 20   SpO2: 97% 97% 98% 98%     General:  Well-developed and nourished.  Eyes: Anicteric no pallor.  ENT: There is a large lesion in the left border of the tongue.  Neck: No mass felt.  Cardiovascular: S1-S2 heard.  Respiratory: No rhonchi or crepitations.  Abdomen: Soft nontender bowel sounds present.  Skin: No rash.  Musculoskeletal: No edema.  Psychiatric: Appears normal.  Neurologic: Alert awake oriented to time place and person. Moves all extremities.  Labs on Admission:  Basic Metabolic Panel:  Recent  Labs Lab 06/19/12 1803  NA 134*  K 3.8  CL 96  CO2 22  GLUCOSE 139*  BUN 5*  CREATININE 0.57  CALCIUM 10.0   Liver Function Tests:  Recent Labs Lab 06/19/12 1803  AST 23  ALT 15  ALKPHOS 112  BILITOT 0.6  PROT 7.6  ALBUMIN 3.3*   No results found for this basename: LIPASE, AMYLASE,  in the last 168 hours No results found for this basename: AMMONIA,  in the last 168 hours CBC:  Recent Labs Lab 06/19/12 1803  WBC 15.5*  HGB 15.3  HCT 42.9  MCV 90.3  PLT 331   Cardiac Enzymes: No results  found for this basename: CKTOTAL, CKMB, CKMBINDEX, TROPONINI,  in the last 168 hours  BNP (last 3 results) No results found for this basename: PROBNP,  in the last 8760 hours CBG:  Recent Labs Lab 06/19/12 1711 06/19/12 1820  GLUCAP 165* 126*    Radiological Exams on Admission: Dg Chest 2 View  06/19/2012   *RADIOLOGY REPORT*  Clinical Data: Weakness.  CHEST - 2 VIEW  Comparison: Chest x-ray 02/21/2012.  Findings: Lung volumes are normal.  No consolidative airspace disease.  No pleural effusions.  No pneumothorax.  No pulmonary nodule or mass noted. A small area of linear scarring in the right midlung is unchanged.  Pulmonary vasculature and the cardiomediastinal silhouette are within normal limits. Atherosclerosis in the thoracic aorta.  IMPRESSION: 1. No radiographic evidence of acute cardiopulmonary disease. 2.  Atherosclerosis.   Original Report Authenticated By: Trudie Reed, M.D.   Ct Head Wo Contrast  06/19/2012   *RADIOLOGY REPORT*  Clinical Data: History of intracranial hemorrhage.  CT HEAD WITHOUT CONTRAST  Technique:  Contiguous axial images were obtained from the base of the skull through the vertex without contrast.  Comparison: Head CT 02/22/2012.  Findings: Well defined area of low attenuation in the medial aspect of the left cerebellar hemisphere is compatible with encephalomalacia at the site of prior hemorrhage.  Postoperative changes of suboccipital craniectomy are noted.  No acute intracranial abnormality.  Specifically, no signs of acute intracranial hemorrhage, no mass, mass effect, hydrocephalus or abnormal intra or extra-axial fluid collections.  No definite evidence of acute/subacute cerebral ischemia.  Visualized paranasal sinuses and mastoids are well pneumatized.  IMPRESSION: 1.  Postoperative changes of suboccipital craniectomy for drainage of prior cerebellar vermian hematoma with a small area of encephalomalacia in the medial left cerebellar hemisphere.  No  evidence of recurrent hemorrhage or other acute intracranial abnormalities.   Original Report Authenticated By: Trudie Reed, M.D.    Assessment/Plan Principal Problem:   Dehydration Active Problems:   HTN (hypertension)   Tobacco use disorder   Leucocytosis   Alcohol abuse   1. Dehydration - secondary to poor oral intake.Continued IV fluids. At this time patient has been placed on clear liquids. Patient's poor oral intake is due to the tongue lesion. Need to consult oral surgeon in a.m. If patient cannot take by mouth then will need PEG tube placement. 2. Leukocytosis - patient is afebrile chest x-ray is unremarkable. Check UA. CT angiogram of the chest and abdomen is pending. 3. Tongue lesion - concerning for cancer. Need to consult oral surgeon in a.m. 4. Tachycardia and elevated d-dimer - CT angiogram of the chest is pending. 5. History of hypertension - due to poor oral intake at this time I have placed patient on when necessary IV hydralazine for systolic blood pressure more than 140 given the history of cerebellar  hemorrhage. 6. Alcohol and tobacco abuse - patient advised to quit this habit. Patient is placed on alcohol withdrawal protocol. 7. 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 Patient does not have any focal deficits or headache.  Addendum - radiologist Dr. Cherly Hensen called me and notified that patient's CT angiogram of the chest shows small PE and also possibility of pneumonia and also esophageal thickening concerning for esophagitis. CT abdomen was showing sigmoid wall thickening. At this time as start patient on heparin infusion for PE and empiric antibiotics for pneumonia and Protonix for esophagitis. Check HIV.  Code Status: Full code.  Family Communication: Patient's family at the bedside.  Disposition Plan: Admit to inpatient.    KAKRAKANDY,ARSHAD N. [facility] Pager [phone].  If 7PM-7AM, please  contact night-coverage [email or web address] Password [email or web address] 06/19/2012, 9:16 PM

## 2012-06-19 NOTE — ED Notes (Signed)
Pt presents for evaluation of abnormal labs on 5/15 from [facility]- office called pt today and stated that his WBC and blood glucose were elevated and advised pt to come to ED.  Pt states he has been eating and drinking less due to sore in mouth- pt has had consult with oral surgery.

## 2012-06-19 NOTE — ED Provider Notes (Signed)
History     CSN: [id number]  Arrival date & time 06/19/12  1659   First MD Initiated Contact with Patient 06/19/12 1743      Chief Complaint  Patient presents with  . abnormal labs     (Consider location/radiation/quality/duration/timing/severity/associated sxs/prior treatment) HPI Comments: Patient presents with abnormal lab dies from 28-Jun-2022. He saw his PCP and was told he had an elevated white blood cell count and blood sugar. He has a lesion on his tongue for the past several weeks making it difficult to eat and drink. He denies any chest pain, shortness of breath, abdominal pain, nausea or vomiting. No history of diabetes. History of stroke status post craniotomy. No abdominal pain, chest pain or shortness of breath. Admits to poor by mouth intake at home.  The history is provided by the patient.    Past Medical History  Diagnosis Date  . History of stomach ulcers   . Hypertension   . GERD (gastroesophageal reflux disease)   . Stroke 01/2012    "left side is nervous; I'm taking PT" (06/19/2012)  . ICH (intracerebral hemorrhage) 02/20/2012    Past Surgical History  Procedure Laterality Date  . Craniectomy  02/20/2012    Procedure: CRANIECTOMY POSTERIOR FOSSA DECOMPRESSION;  Surgeon: Temple Pacini, MD;  Location: [facility];  Service: Neurosurgery;  Laterality: N/A;  Craniectomy Posterior Fossa Decompression   . Appendectomy      "when I was young" (06/19/2012)    Family History  Problem Relation Age of Onset  . Cervical cancer Mother   . Stroke Father     History  Substance Use Topics  . Smoking status: Current Every Day Smoker -- 2.00 packs/day for 35 years    Types: Cigarettes  . Smokeless tobacco: Never Used  . Alcohol Use: 9.0 oz/week    15 Cans of beer per week     Comment: 06/19/2012 "4-5 beers/day; probably 3 days/wk"      Review of Systems  Constitutional: Positive for activity change, appetite change and fatigue. Negative for fever.  HENT: Positive for  trouble swallowing and dental problem. Negative for congestion and rhinorrhea.   Respiratory: Negative for cough, chest tightness and shortness of breath.   Cardiovascular: Negative for chest pain.  Gastrointestinal: Negative for nausea, vomiting and abdominal pain.  Genitourinary: Negative for dysuria and hematuria.  Musculoskeletal: Negative for back pain.  Neurological: Positive for weakness. Negative for dizziness, light-headedness and headaches.    Allergies  Review of patient's allergies indicates no known allergies.  Home Medications   No current outpatient prescriptions on file.  BP 128/90  Pulse 104  Temp(Src) 98.1 F (36.7 C) (Oral)  Resp 20  Ht 5\' 7"  (1.702 m)  Wt 121 lb 11.1 oz (55.2 kg)  BMI 19.06 kg/m2  SpO2 98%  Physical Exam  Constitutional: He is oriented to person, place, and time. He appears well-developed and well-nourished. No distress.  HENT:  Head: Normocephalic and atraumatic.  Mouth/Throat: Oropharynx is clear and moist. No oropharyngeal exudate.  Dry mucous membranes,extremely poor dentition. Large fungating mass to left tongue and floor of mouth soft  Eyes: Conjunctivae and EOM are normal. Pupils are equal, round, and reactive to light.  Neck: Normal range of motion. Neck supple.  Cardiovascular: Regular rhythm and normal heart sounds.   No murmur heard. Tachycardic  Pulmonary/Chest: Effort normal and breath sounds normal.  Abdominal: Soft. There is no tenderness. There is no rebound and no guarding.  Musculoskeletal: Normal range of motion.  He exhibits no edema and no tenderness.  Neurological: He is alert and oriented to person, place, and time. No cranial nerve deficit. He exhibits normal muscle tone. Coordination normal.  Skin: Skin is warm.    ED Course  Procedures (including critical care time)  Labs Reviewed  CBC - Abnormal; Notable for the following:    WBC 15.5 (*)    All other components within normal limits  COMPREHENSIVE  METABOLIC PANEL - Abnormal; Notable for the following:    Sodium 134 (*)    Glucose, Bld 139 (*)    BUN 5 (*)    Albumin 3.3 (*)    All other components within normal limits  GLUCOSE, CAPILLARY - Abnormal; Notable for the following:    Glucose-Capillary 126 (*)    All other components within normal limits  D-DIMER, QUANTITATIVE - Abnormal; Notable for the following:    D-Dimer, Quant 1.36 (*)    All other components within normal limits  GLUCOSE, CAPILLARY - Abnormal; Notable for the following:    Glucose-Capillary 165 (*)    All other components within normal limits  CG4 I-STAT (LACTIC ACID) - Abnormal; Notable for the following:    Lactic Acid, Venous 2.28 (*)    All other components within normal limits  PROTIME-INR  URINALYSIS, ROUTINE W REFLEX MICROSCOPIC  COMPREHENSIVE METABOLIC PANEL  LACTIC ACID, PLASMA  CBC WITH DIFFERENTIAL  HIV ANTIBODY (ROUTINE TESTING)  HEPARIN LEVEL (UNFRACTIONATED)  POCT I-STAT TROPONIN I   Dg Chest 2 View  06/19/2012   *RADIOLOGY REPORT*  Clinical Data: Weakness.  CHEST - 2 VIEW  Comparison: Chest x-ray 02/21/2012.  Findings: Lung volumes are normal.  No consolidative airspace disease.  No pleural effusions.  No pneumothorax.  No pulmonary nodule or mass noted. A small area of linear scarring in the right midlung is unchanged.  Pulmonary vasculature and the cardiomediastinal silhouette are within normal limits. Atherosclerosis in the thoracic aorta.  IMPRESSION: 1. No radiographic evidence of acute cardiopulmonary disease. 2.  Atherosclerosis.   Original Report Authenticated By: Trudie Reed, M.D.   Ct Head Wo Contrast  06/19/2012   *RADIOLOGY REPORT*  Clinical Data: History of intracranial hemorrhage.  CT HEAD WITHOUT CONTRAST  Technique:  Contiguous axial images were obtained from the base of the skull through the vertex without contrast.  Comparison: Head CT 02/22/2012.  Findings: Well defined area of low attenuation in the medial aspect of the  left cerebellar hemisphere is compatible with encephalomalacia at the site of prior hemorrhage.  Postoperative changes of suboccipital craniectomy are noted.  No acute intracranial abnormality.  Specifically, no signs of acute intracranial hemorrhage, no mass, mass effect, hydrocephalus or abnormal intra or extra-axial fluid collections.  No definite evidence of acute/subacute cerebral ischemia.  Visualized paranasal sinuses and mastoids are well pneumatized.  IMPRESSION: 1.  Postoperative changes of suboccipital craniectomy for drainage of prior cerebellar vermian hematoma with a small area of encephalomalacia in the medial left cerebellar hemisphere.  No evidence of recurrent hemorrhage or other acute intracranial abnormalities.   Original Report Authenticated By: Trudie Reed, M.D.   Ct Angio Chest Pe W/cm &/or Wo Cm  06/20/2012   *RADIOLOGY REPORT*  Clinical Data:  Leukocytosis; tachycardia.  Concern for pulmonary embolus.  Anorexia.  CT ANGIOGRAPHY CHEST CT ABDOMEN AND PELVIS WITH CONTRAST  Technique:  Multidetector CT imaging of the chest was performed using the standard protocol during bolus administration of intravenous contrast.  Multiplanar CT image reconstructions including MIPs were obtained to evaluate the vascular  anatomy. Multidetector CT imaging of the abdomen and pelvis was performed using the standard protocol during bolus administration of intravenous contrast.  Contrast: OMNIPAQUE IOHEXOL 350 MG/ML SOLN,  Comparison:   None.  CTA CHEST  Findings:  A relatively small pulmonary embolus is noted within pulmonary artery branches to the left lower lobe.  This embolus is nonocclusive in nature.  Minimal bilateral atelectasis or scarring is noted, with a few areas of mild nonspecific haziness noted in the left lower lobe. This is nonspecific but could reflect a mild infectious process, or possibly atelectasis.  There is no evidence of pleural effusion or pneumothorax.  No masses are  identified; no abnormal focal contrast enhancement is seen.  Scattered mediastinal nodes remain normal in size.  Scattered coronary artery calcifications are seen.  The esophageal wall appears mildly diffusely thickened, possibly reflecting mild esophagitis.  No pericardial effusion is identified.  The great vessels are grossly unremarkable in appearance.  No axillary lymphadenopathy is seen.  The visualized portions of the thyroid gland are unremarkable in appearance.  No acute osseous abnormalities are seen.   Review of the MIP images confirms the above findings.  IMPRESSION:  1.  Relatively small pulmonary embolus noted than pulmonary artery branches to the left lower lung lobe.  This embolus is nonocclusive in nature. 2.  Minimal bilateral atelectasis or scarring noted.  Few areas of mild nonspecific haziness noted in the left lower lobe.  This could reflect a mild infectious process, or possibly atelectasis. 3.  Mild diffuse esophageal wall thickening may reflect mild esophagitis.  4.  Scattered coronary artery calcifications seen.  CT ABDOMEN AND PELVIS  Findings: The liver and spleen are unremarkable in appearance.  The gallbladder is within normal limits.  The pancreas and adrenal glands are unremarkable.  Scattered calcifications at the renal hila appear to be vascular in nature.  Minimal nonspecific perinephric stranding is noted bilaterally.  The kidneys are otherwise unremarkable in appearance. There is no evidence of hydronephrosis.  No renal or ureteral stones are seen.  No free fluid is identified.  The small bowel is unremarkable in appearance.  The stomach is within normal limits.  No acute vascular abnormalities are seen.  There is relatively diffuse calcification along the abdominal aorta and its branches, including along the proximal right renal artery. Extensive calcification along the common iliac arteries and their branches raises concern for significant luminal narrowing bilaterally.  The  patient is status post appendectomy.  Contrast progresses to the level of the proximal sigmoid colon.  The sigmoid colon is redundant.  There appears to be mild wall thickening at the distal sigmoid colon, though this is partially obscured by a bolus of stool in the rectum.  Would consider sigmoidoscopy for further evaluation, when and as deemed clinically appropriate.  The bladder is moderately distended and grossly unremarkable in appearance.  The prostate remains normal in size, with scattered calcification.  No inguinal lymphadenopathy is seen.  No acute osseous abnormalities are identified.  Review of the MIP images confirms the above findings.  IMPRESSION:  1.  Apparent mild wall thickening at the distal sigmoid colon, partially obscured by a bolus of stool in the rectum.  Would consider sigmoidoscopy for further evaluation, when and as deemed clinically appropriate. 2.  Relatively diffuse calcification along the abdominal aorta and its branches, including along the proximal right renal artery. Extensive calcification along the common iliac arteries and their branches, raising concern for significant luminal narrowing bilaterally.  Critical Value/emergent results  were called by telephone at the time of interpretation on 06/20/2012 at 01:06 a.m. to Dr. Midge Minium, who verbally acknowledged these results.   Original Report Authenticated By: Tonia Ghent, M.D.   Ct Abdomen Pelvis W Contrast  06/20/2012   *RADIOLOGY REPORT*  Clinical Data:  Leukocytosis; tachycardia.  Concern for pulmonary embolus.  Anorexia.  CT ANGIOGRAPHY CHEST CT ABDOMEN AND PELVIS WITH CONTRAST  Technique:  Multidetector CT imaging of the chest was performed using the standard protocol during bolus administration of intravenous contrast.  Multiplanar CT image reconstructions including MIPs were obtained to evaluate the vascular anatomy. Multidetector CT imaging of the abdomen and pelvis was performed using the standard protocol  during bolus administration of intravenous contrast.  Contrast: OMNIPAQUE IOHEXOL 350 MG/ML SOLN,  Comparison:   None.  CTA CHEST  Findings:  A relatively small pulmonary embolus is noted within pulmonary artery branches to the left lower lobe.  This embolus is nonocclusive in nature.  Minimal bilateral atelectasis or scarring is noted, with a few areas of mild nonspecific haziness noted in the left lower lobe. This is nonspecific but could reflect a mild infectious process, or possibly atelectasis.  There is no evidence of pleural effusion or pneumothorax.  No masses are identified; no abnormal focal contrast enhancement is seen.  Scattered mediastinal nodes remain normal in size.  Scattered coronary artery calcifications are seen.  The esophageal wall appears mildly diffusely thickened, possibly reflecting mild esophagitis.  No pericardial effusion is identified.  The great vessels are grossly unremarkable in appearance.  No axillary lymphadenopathy is seen.  The visualized portions of the thyroid gland are unremarkable in appearance.  No acute osseous abnormalities are seen.   Review of the MIP images confirms the above findings.  IMPRESSION:  1.  Relatively small pulmonary embolus noted than pulmonary artery branches to the left lower lung lobe.  This embolus is nonocclusive in nature. 2.  Minimal bilateral atelectasis or scarring noted.  Few areas of mild nonspecific haziness noted in the left lower lobe.  This could reflect a mild infectious process, or possibly atelectasis. 3.  Mild diffuse esophageal wall thickening may reflect mild esophagitis.  4.  Scattered coronary artery calcifications seen.  CT ABDOMEN AND PELVIS  Findings: The liver and spleen are unremarkable in appearance.  The gallbladder is within normal limits.  The pancreas and adrenal glands are unremarkable.  Scattered calcifications at the renal hila appear to be vascular in nature.  Minimal nonspecific perinephric stranding is noted  bilaterally.  The kidneys are otherwise unremarkable in appearance. There is no evidence of hydronephrosis.  No renal or ureteral stones are seen.  No free fluid is identified.  The small bowel is unremarkable in appearance.  The stomach is within normal limits.  No acute vascular abnormalities are seen.  There is relatively diffuse calcification along the abdominal aorta and its branches, including along the proximal right renal artery. Extensive calcification along the common iliac arteries and their branches raises concern for significant luminal narrowing bilaterally.  The patient is status post appendectomy.  Contrast progresses to the level of the proximal sigmoid colon.  The sigmoid colon is redundant.  There appears to be mild wall thickening at the distal sigmoid colon, though this is partially obscured by a bolus of stool in the rectum.  Would consider sigmoidoscopy for further evaluation, when and as deemed clinically appropriate.  The bladder is moderately distended and grossly unremarkable in appearance.  The prostate remains normal in size, with  scattered calcification.  No inguinal lymphadenopathy is seen.  No acute osseous abnormalities are identified.  Review of the MIP images confirms the above findings.  IMPRESSION:  1.  Apparent mild wall thickening at the distal sigmoid colon, partially obscured by a bolus of stool in the rectum.  Would consider sigmoidoscopy for further evaluation, when and as deemed clinically appropriate. 2.  Relatively diffuse calcification along the abdominal aorta and its branches, including along the proximal right renal artery. Extensive calcification along the common iliac arteries and their branches, raising concern for significant luminal narrowing bilaterally.  Critical Value/emergent results were called by telephone at the time of interpretation on 06/20/2012 at 01:06 a.m. to Dr. Midge Minium, who verbally acknowledged these results.   Original Report  Authenticated By: Tonia Ghent, M.D.     1. Dehydration   2. Tachycardia   3. Alcohol abuse   4. HTN (hypertension)       MDM  Tachycardia with decreased PO intake 2/2 tongue lesion. Leukocytosis on outside labs.  No distress, controlling secretions.  Persistent tachycardia in 110-120s despite IVF. 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 Will need admission for ongoing hydration and possible PEG placement as well as evaluation of tongue lesion. Cr normal.  D-dimer positive, will obtain CTPE.   CT positive for PE after patient transfer to floor. Dr. Toniann Fail aware. Patient with history of hemorrhagic stroke 4 months ago.        Date: 06/19/2012  Rate: 127  Rhythm: sinus tachycardia  QRS Axis: normal  Intervals: normal  ST/T Wave abnormalities: normal  Conduction Disutrbances:none  Narrative Interpretation: rate faster  Old EKG Reviewed: changes noted    Glynn Octave, MD 06/20/12 [phone]

## 2012-06-19 NOTE — ED Notes (Signed)
Pt had lab drawn at [facility] on the 15, office called today to report pt had elevated WBC and elevated CBG. Pt reports inability to eat due to wound on tongue. White/open sore to inside of moth on bottom of tongue. Pt is tachycardic in the 120s, afebrile, CBG 195. Pt denies pain, denies sob, reports feeling weak all over. Pt appears frail. Alert and oriented, answers all questions appropriately.

## 2012-06-19 NOTE — Telephone Encounter (Signed)
Patient was calling back on labs they are aware

## 2012-06-20 ENCOUNTER — Inpatient Hospital Stay (HOSPITAL_COMMUNITY): Payer: Medicaid Other

## 2012-06-20 ENCOUNTER — Encounter (HOSPITAL_COMMUNITY): Payer: Self-pay | Admitting: Radiology

## 2012-06-20 DIAGNOSIS — J189 Pneumonia, unspecified organism: Secondary | ICD-10-CM | POA: Diagnosis present

## 2012-06-20 DIAGNOSIS — K148 Other diseases of tongue: Secondary | ICD-10-CM | POA: Diagnosis present

## 2012-06-20 DIAGNOSIS — I2699 Other pulmonary embolism without acute cor pulmonale: Secondary | ICD-10-CM | POA: Diagnosis present

## 2012-06-20 DIAGNOSIS — I619 Nontraumatic intracerebral hemorrhage, unspecified: Secondary | ICD-10-CM

## 2012-06-20 DIAGNOSIS — D72829 Elevated white blood cell count, unspecified: Secondary | ICD-10-CM

## 2012-06-20 LAB — COMPREHENSIVE METABOLIC PANEL
Alkaline Phosphatase: 81 U/L (ref 39–117)
BUN: 4 mg/dL — ABNORMAL LOW (ref 6–23)
Chloride: 103 mEq/L (ref 96–112)
Creatinine, Ser: 0.64 mg/dL (ref 0.50–1.35)
GFR calc Af Amer: >90 mL/min (ref >90)
Glucose, Bld: 155 mg/dL — ABNORMAL HIGH (ref 70–99)
Potassium: 3.6 mEq/L (ref 3.5–5.1)
Total Bilirubin: 0.3 mg/dL (ref 0.3–1.2)
Total Protein: 5.8 g/dL — ABNORMAL LOW (ref 6.0–8.3)

## 2012-06-20 LAB — CBC WITH DIFFERENTIAL/PLATELET
Basophils Absolute: 0 10*3/uL (ref 0.0–0.1)
Basophils Relative: 0 % (ref 0–1)
MCHC: 34.5 g/dL (ref 30.0–36.0)
Neutro Abs: 5.6 10*3/uL (ref 1.7–7.7)
Neutrophils Relative %: 55 % (ref 43–77)
RDW: 15.5 % (ref 11.5–15.5)

## 2012-06-20 LAB — GLUCOSE, CAPILLARY
Glucose-Capillary: 121 mg/dL — ABNORMAL HIGH (ref 70–99)
Glucose-Capillary: 130 mg/dL — ABNORMAL HIGH (ref 70–99)

## 2012-06-20 LAB — HIV ANTIBODY (ROUTINE TESTING W REFLEX): HIV: NONREACTIVE

## 2012-06-20 LAB — LACTIC ACID, PLASMA: Lactic Acid, Venous: 1.2 mmol/L (ref 0.5–2.2)

## 2012-06-20 MED ORDER — LIDOCAINE-EPINEPHRINE 1 %-1:100000 IJ SOLN
20.0000 mL | Freq: Once | INTRAMUSCULAR | Status: AC
  Administered 2012-06-21: 20 mL via INTRADERMAL
  Filled 2012-06-20 (×3): qty 20

## 2012-06-20 MED ORDER — IOHEXOL 300 MG/ML  SOLN
75.0000 mL | Freq: Once | INTRAMUSCULAR | Status: AC | PRN
  Administered 2012-06-20: 75 mL via INTRAVENOUS

## 2012-06-20 MED ORDER — NICOTINE 21 MG/24HR TD PT24
21.0000 mg | MEDICATED_PATCH | Freq: Every day | TRANSDERMAL | Status: DC
  Administered 2012-06-20 – 2012-06-22 (×3): 21 mg via TRANSDERMAL
  Filled 2012-06-20 (×3): qty 1

## 2012-06-20 MED ORDER — PNEUMOCOCCAL VAC POLYVALENT 25 MCG/0.5ML IJ INJ
0.5000 mL | INJECTION | Freq: Once | INTRAMUSCULAR | Status: DC
  Filled 2012-06-20: qty 0.5

## 2012-06-20 MED ORDER — BOOST / RESOURCE BREEZE PO LIQD
1.0000 | Freq: Two times a day (BID) | ORAL | Status: DC
  Administered 2012-06-20 – 2012-06-21 (×3): 1 via ORAL

## 2012-06-20 MED ORDER — DEXTROSE 5 % IV SOLN
500.0000 mg | INTRAVENOUS | Status: DC
  Administered 2012-06-20 – 2012-06-22 (×3): 500 mg via INTRAVENOUS
  Filled 2012-06-20 (×4): qty 500

## 2012-06-20 MED ORDER — HEPARIN (PORCINE) IN NACL 100-0.45 UNIT/ML-% IJ SOLN
1050.0000 [IU]/h | INTRAMUSCULAR | Status: DC
  Administered 2012-06-20: 900 [IU]/h via INTRAVENOUS
  Filled 2012-06-20 (×3): qty 250

## 2012-06-20 MED ORDER — PANTOPRAZOLE SODIUM 40 MG IV SOLR
40.0000 mg | Freq: Every day | INTRAVENOUS | Status: DC
  Administered 2012-06-20 – 2012-06-21 (×3): 40 mg via INTRAVENOUS
  Filled 2012-06-20 (×4): qty 40

## 2012-06-20 MED ORDER — DEXTROSE 5 % IV SOLN
1.0000 g | Freq: Every day | INTRAVENOUS | Status: DC
  Administered 2012-06-20 – 2012-06-21 (×3): 1 g via INTRAVENOUS
  Filled 2012-06-20 (×4): qty 10

## 2012-06-20 NOTE — Progress Notes (Signed)
INITIAL NUTRITION ASSESSMENT  DOCUMENTATION CODES Per approved criteria  -Severe malnutrition in the context of acute illness or injury   INTERVENTION:  Resource Breeze twice daily (250 kcals, 9 gm protein per 8 fl oz carton) RD to follow for nutrition care plan  NUTRITION DIAGNOSIS: Inadequate oral intake related to tongue lesion as evidenced by PO intake 0%  Goal: Oral intake with meals & supplements to meet >/= 90% of estimated nutrition needs  Monitor:  PO & supplemental intake, weight, labs, I/O's  Reason for Assessment: Malnutrition Screening Tool Report  52 y.o. male  Admitting Dx: Dehydration  ASSESSMENT: Patient with history of HTN, alcohol & tabacco abuse and cerebellar hemorrhage status post surgery in January 2014; presented to ER after patient's primary care physician referred him for abnormal labs showing leukocytosis.  Patient reports he's not been eating well for the past few weeks given tongue ulcer/lesion; states to RD he's only been consuming 1 meal per day PTA; + weight loss, however does not know amount; per records, patient has lost 20 lbs since January (14%); amenable to trying [employer] supplement ---> RD to order.  Patient meets criteria for severe malnutrition in the context of acute illness or injury given < 50% intake of estimated energy requirement for > 5 days and 14% weight loss in > 3 months.  Height: Ht Readings from Last 1 Encounters:  06/19/12 5\' 7"  (1.702 m)    Weight: Wt Readings from Last 1 Encounters:  06/19/12 121 lb 11.1 oz (55.2 kg)    Ideal Body Weight: 148 lb  % Ideal Body Weight: 82%  Wt Readings from Last 10 Encounters:  06/19/12 121 lb 11.1 oz (55.2 kg)  05/30/12 120 lb (54.432 kg)  02/21/12 141 lb 15.6 oz (64.4 kg)  02/21/12 141 lb 15.6 oz (64.4 kg)    Usual Body Weight: 141 lb  % Usual Body Weight: 86%  BMI:  Body mass index is 19.06 kg/(m^2).  Estimated Nutritional Needs: Kcal: 1500-1700 Protein:  70-80 gm Fluid: 1.5-1.7 L  Skin: Intact  Diet Order: Clear Liquid  EDUCATION NEEDS: -No education needs identified at this time   Intake/Output Summary (Last 24 hours) at 06/20/12 1046 Last data filed at 06/20/12 0730  Gross per 24 hour  Intake   1331 ml  Output   1775 ml  Net   -444 ml    Labs:   Recent Labs Lab 06/19/12 1803 06/20/12 0505  NA 134* 137  K 3.8 3.6  CL 96 103  CO2 22 24  BUN 5* 4*  CREATININE 0.57 0.64  CALCIUM 10.0 9.1  GLUCOSE 139* 155*    CBG (last 3)   Recent Labs  06/19/12 2200 06/19/12 2352 06/20/12 0600  GLUCAP 113* 94 121*    Scheduled Meds: . azithromycin  500 mg Intravenous Q24H  . cefTRIAXone (ROCEPHIN)  IV  1 g Intravenous QHS  . folic acid  1 mg Oral Daily  . LORazepam  0-4 mg Intravenous Q6H   Followed by  . [START ON 06/21/2012] LORazepam  0-4 mg Intravenous Q12H  . multivitamin with minerals  1 tablet Oral Daily  . nicotine  21 mg Transdermal Daily  . pantoprazole (PROTONIX) IV  40 mg Intravenous QHS  . pneumococcal 23 valent vaccine  0.5 mL Intramuscular Once  . sodium chloride  3 mL Intravenous Q12H  . thiamine  100 mg Oral Daily   Or  . thiamine  100 mg Intravenous Daily    Continuous Infusions: .  sodium chloride 125 mL/hr at 06/20/12 0021  . heparin 900 Units/hr (06/20/12 0227)    Past Medical History  Diagnosis Date  . History of stomach ulcers   . Hypertension   . GERD (gastroesophageal reflux disease)   . Stroke 01/2012    "left side is nervous; I'm taking PT" (06/19/2012)  . ICH (intracerebral hemorrhage) 02/20/2012    Past Surgical History  Procedure Laterality Date  . Craniectomy  02/20/2012    Procedure: CRANIECTOMY POSTERIOR FOSSA DECOMPRESSION;  Surgeon: Temple Pacini, MD;  Location: [facility];  Service: Neurosurgery;  Laterality: N/A;  Craniectomy Posterior Fossa Decompression   . Appendectomy      "when I was young" (06/19/2012)    Maureen Chatters, RD, LDN Pager #: [phone] After-Hours  Pager #: [phone]

## 2012-06-20 NOTE — Progress Notes (Signed)
[facility] PROGRESS NOTE  Jason Strong ZOX:[id number] DOB: 1960/02/28 DOA: 06/19/2012 PCP: Redmond Baseman, MD  Assessment/Plan:  1. Dehydration - secondary to poor oral intake.Continued IV fluids. At this time patient has been placed on clear liquids. Patient's poor oral intake is due to the tongue lesion.  If patient cannot take by mouth then will need PEG tube placement. In the process of consulting oral surgery.    2. Leukocytosis -  Resolved. patient is afebrile chest x-ray is unremarkable. UA is negative for infection.. CT angiogram of the chest and abdomen  Showed a small PE in the left lower lobe and haziness consistent with infectious process. So he was started on IV heparin and IV antibiotics.  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  ENT consulted for biopsy.  4. Tachycardia and elevated d-dimer - CT angiogram of the chest shows small PE on IV heparin.  5.. History of hypertension - due to poor oral intake at this time I have placed patient on when necessary IV hydralazine for systolic blood pressure more than 140 given the history of cerebellar hemorrhage.  6. Alcohol and tobacco abuse - patient advised to quit this habit. Patient is placed on alcohol withdrawal protocol.  7. 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 Patient does not have any focal deficits or headache.  Code Status: full code Family Communication: none at bedside Disposition Plan: pending.    Consultants:  ENT   Procedures:  None so far  Antibiotics:  Rocephin   zithromax   HPI/Subjective: Reports pain in the mouth.   Objective: Filed Vitals:   06/19/12 2107 06/19/12 2324 06/20/12 0300 06/20/12 0443  BP: 128/90   101/76  Pulse: 104  75 87  Temp: 98.1 F (36.7 C)   98 F (36.7 C)  TempSrc: Oral   Oral  Resp: 20   18  Height:  5\' 7"  (1.702 m)    Weight:  55.2 kg (121 lb 11.1 oz)    SpO2: 98%    97%    Intake/Output Summary (Last 24 hours) at 06/20/12 1037 Last data filed at 06/20/12 0730  Gross per 24 hour  Intake   1331 ml  Output   1775 ml  Net   -444 ml   Filed Weights   06/19/12 2324  Weight: 55.2 kg (121 lb 11.1 oz)    Exam:  General: Well-developed and nourished.  Eyes: Anicteric no pallor.  ENT: There is a large lesion in the left border of the tongue. Cardiovascular: S1-S2 heard.  Respiratory: No rhonchi or crepitations.  Abdomen: Soft nontender bowel sounds present Musculoskeletal: No edema   Data Reviewed: Basic Metabolic Panel:  Recent Labs Lab 06/19/12 1803 06/20/12 0505  NA 134* 137  K 3.8 3.6  CL 96 103  CO2 22 24  GLUCOSE 139* 155*  BUN 5* 4*  CREATININE 0.57 0.64  CALCIUM 10.0 9.1   Liver Function Tests:  Recent Labs Lab 06/19/12 1803 06/20/12 0505  AST 23 17  ALT 15 11  ALKPHOS 112 81  BILITOT 0.6 0.3  PROT 7.6 5.8*  ALBUMIN 3.3* 2.5*   No results found for this basename: LIPASE, AMYLASE,  in the last 168 hours No results found for this basename: AMMONIA,  in the last 168 hours CBC:  Recent Labs Lab 06/19/12 1803 06/20/12 0505  WBC 15.5* 10.1  NEUTROABS  --  5.6  HGB 15.3 12.2*  HCT 42.9  35.4*  MCV 90.3 91.9  PLT 331 276   Cardiac Enzymes: No results found for this basename: CKTOTAL, CKMB, CKMBINDEX, TROPONINI,  in the last 168 hours BNP (last 3 results) No results found for this basename: PROBNP,  in the last 8760 hours CBG:  Recent Labs Lab 06/19/12 1711 06/19/12 1820 06/19/12 2200 06/19/12 2352 06/20/12 0600  GLUCAP 165* 126* 113* 94 121*    No results found for this or any previous visit (from the past 240 hour(s)).   Studies: Dg Chest 2 View  06/19/2012   *RADIOLOGY REPORT*  Clinical Data: Weakness.  CHEST - 2 VIEW  Comparison: Chest x-ray 02/21/2012.  Findings: Lung volumes are normal.  No consolidative airspace disease.  No pleural effusions.  No pneumothorax.  No pulmonary nodule or mass  noted. A small area of linear scarring in the right midlung is unchanged.  Pulmonary vasculature and the cardiomediastinal silhouette are within normal limits. Atherosclerosis in the thoracic aorta.  IMPRESSION: 1. No radiographic evidence of acute cardiopulmonary disease. 2.  Atherosclerosis.   Original Report Authenticated By: Trudie Reed, M.D.   Ct Head Wo Contrast  06/19/2012   *RADIOLOGY REPORT*  Clinical Data: History of intracranial hemorrhage.  CT HEAD WITHOUT CONTRAST  Technique:  Contiguous axial images were obtained from the base of the skull through the vertex without contrast.  Comparison: Head CT 02/22/2012.  Findings: Well defined area of low attenuation in the medial aspect of the left cerebellar hemisphere is compatible with encephalomalacia at the site of prior hemorrhage.  Postoperative changes of suboccipital craniectomy are noted.  No acute intracranial abnormality.  Specifically, no signs of acute intracranial hemorrhage, no mass, mass effect, hydrocephalus or abnormal intra or extra-axial fluid collections.  No definite evidence of acute/subacute cerebral ischemia.  Visualized paranasal sinuses and mastoids are well pneumatized.  IMPRESSION: 1.  Postoperative changes of suboccipital craniectomy for drainage of prior cerebellar vermian hematoma with a small area of encephalomalacia in the medial left cerebellar hemisphere.  No evidence of recurrent hemorrhage or other acute intracranial abnormalities.   Original Report Authenticated By: Trudie Reed, M.D.   Ct Angio Chest Pe W/cm &/or Wo Cm  06/20/2012   *RADIOLOGY REPORT*  Clinical Data:  Leukocytosis; tachycardia.  Concern for pulmonary embolus.  Anorexia.  CT ANGIOGRAPHY CHEST CT ABDOMEN AND PELVIS WITH CONTRAST  Technique:  Multidetector CT imaging of the chest was performed using the standard protocol during bolus administration of intravenous contrast.  Multiplanar CT image reconstructions including MIPs were obtained to  evaluate the vascular anatomy. Multidetector CT imaging of the abdomen and pelvis was performed using the standard protocol during bolus administration of intravenous contrast.  Contrast: OMNIPAQUE IOHEXOL 350 MG/ML SOLN,  Comparison:   None.  CTA CHEST  Findings:  A relatively small pulmonary embolus is noted within pulmonary artery branches to the left lower lobe.  This embolus is nonocclusive in nature.  Minimal bilateral atelectasis or scarring is noted, with a few areas of mild nonspecific haziness noted in the left lower lobe. This is nonspecific but could reflect a mild infectious process, or possibly atelectasis.  There is no evidence of pleural effusion or pneumothorax.  No masses are identified; no abnormal focal contrast enhancement is seen.  Scattered mediastinal nodes remain normal in size.  Scattered coronary artery calcifications are seen.  The esophageal wall appears mildly diffusely thickened, possibly reflecting mild esophagitis.  No pericardial effusion is identified.  The great vessels are grossly unremarkable in appearance.  No axillary  lymphadenopathy is seen.  The visualized portions of the thyroid gland are unremarkable in appearance.  No acute osseous abnormalities are seen.   Review of the MIP images confirms the above findings.  IMPRESSION:  1.  Relatively small pulmonary embolus noted than pulmonary artery branches to the left lower lung lobe.  This embolus is nonocclusive in nature. 2.  Minimal bilateral atelectasis or scarring noted.  Few areas of mild nonspecific haziness noted in the left lower lobe.  This could reflect a mild infectious process, or possibly atelectasis. 3.  Mild diffuse esophageal wall thickening may reflect mild esophagitis.  4.  Scattered coronary artery calcifications seen.  CT ABDOMEN AND PELVIS  Findings: The liver and spleen are unremarkable in appearance.  The gallbladder is within normal limits.  The pancreas and adrenal glands are unremarkable.   Scattered calcifications at the renal hila appear to be vascular in nature.  Minimal nonspecific perinephric stranding is noted bilaterally.  The kidneys are otherwise unremarkable in appearance. There is no evidence of hydronephrosis.  No renal or ureteral stones are seen.  No free fluid is identified.  The small bowel is unremarkable in appearance.  The stomach is within normal limits.  No acute vascular abnormalities are seen.  There is relatively diffuse calcification along the abdominal aorta and its branches, including along the proximal right renal artery. Extensive calcification along the common iliac arteries and their branches raises concern for significant luminal narrowing bilaterally.  The patient is status post appendectomy.  Contrast progresses to the level of the proximal sigmoid colon.  The sigmoid colon is redundant.  There appears to be mild wall thickening at the distal sigmoid colon, though this is partially obscured by a bolus of stool in the rectum.  Would consider sigmoidoscopy for further evaluation, when and as deemed clinically appropriate.  The bladder is moderately distended and grossly unremarkable in appearance.  The prostate remains normal in size, with scattered calcification.  No inguinal lymphadenopathy is seen.  No acute osseous abnormalities are identified.  Review of the MIP images confirms the above findings.  IMPRESSION:  1.  Apparent mild wall thickening at the distal sigmoid colon, partially obscured by a bolus of stool in the rectum.  Would consider sigmoidoscopy for further evaluation, when and as deemed clinically appropriate. 2.  Relatively diffuse calcification along the abdominal aorta and its branches, including along the proximal right renal artery. Extensive calcification along the common iliac arteries and their branches, raising concern for significant luminal narrowing bilaterally.  Critical Value/emergent results were called by telephone at the time of  interpretation on 06/20/2012 at 01:06 a.m. to Dr. Midge Minium, who verbally acknowledged these results.   Original Report Authenticated By: Tonia Ghent, M.D.   Ct Abdomen Pelvis W Contrast  06/20/2012   *RADIOLOGY REPORT*  Clinical Data:  Leukocytosis; tachycardia.  Concern for pulmonary embolus.  Anorexia.  CT ANGIOGRAPHY CHEST CT ABDOMEN AND PELVIS WITH CONTRAST  Technique:  Multidetector CT imaging of the chest was performed using the standard protocol during bolus administration of intravenous contrast.  Multiplanar CT image reconstructions including MIPs were obtained to evaluate the vascular anatomy. Multidetector CT imaging of the abdomen and pelvis was performed using the standard protocol during bolus administration of intravenous contrast.  Contrast: OMNIPAQUE IOHEXOL 350 MG/ML SOLN,  Comparison:   None.  CTA CHEST  Findings:  A relatively small pulmonary embolus is noted within pulmonary artery branches to the left lower lobe.  This embolus is nonocclusive in nature.  Minimal bilateral atelectasis or scarring is noted, with a few areas of mild nonspecific haziness noted in the left lower lobe. This is nonspecific but could reflect a mild infectious process, or possibly atelectasis.  There is no evidence of pleural effusion or pneumothorax.  No masses are identified; no abnormal focal contrast enhancement is seen.  Scattered mediastinal nodes remain normal in size.  Scattered coronary artery calcifications are seen.  The esophageal wall appears mildly diffusely thickened, possibly reflecting mild esophagitis.  No pericardial effusion is identified.  The great vessels are grossly unremarkable in appearance.  No axillary lymphadenopathy is seen.  The visualized portions of the thyroid gland are unremarkable in appearance.  No acute osseous abnormalities are seen.   Review of the MIP images confirms the above findings.  IMPRESSION:  1.  Relatively small pulmonary embolus noted than pulmonary  artery branches to the left lower lung lobe.  This embolus is nonocclusive in nature. 2.  Minimal bilateral atelectasis or scarring noted.  Few areas of mild nonspecific haziness noted in the left lower lobe.  This could reflect a mild infectious process, or possibly atelectasis. 3.  Mild diffuse esophageal wall thickening may reflect mild esophagitis.  4.  Scattered coronary artery calcifications seen.  CT ABDOMEN AND PELVIS  Findings: The liver and spleen are unremarkable in appearance.  The gallbladder is within normal limits.  The pancreas and adrenal glands are unremarkable.  Scattered calcifications at the renal hila appear to be vascular in nature.  Minimal nonspecific perinephric stranding is noted bilaterally.  The kidneys are otherwise unremarkable in appearance. There is no evidence of hydronephrosis.  No renal or ureteral stones are seen.  No free fluid is identified.  The small bowel is unremarkable in appearance.  The stomach is within normal limits.  No acute vascular abnormalities are seen.  There is relatively diffuse calcification along the abdominal aorta and its branches, including along the proximal right renal artery. Extensive calcification along the common iliac arteries and their branches raises concern for significant luminal narrowing bilaterally.  The patient is status post appendectomy.  Contrast progresses to the level of the proximal sigmoid colon.  The sigmoid colon is redundant.  There appears to be mild wall thickening at the distal sigmoid colon, though this is partially obscured by a bolus of stool in the rectum.  Would consider sigmoidoscopy for further evaluation, when and as deemed clinically appropriate.  The bladder is moderately distended and grossly unremarkable in appearance.  The prostate remains normal in size, with scattered calcification.  No inguinal lymphadenopathy is seen.  No acute osseous abnormalities are identified.  Review of the MIP images confirms the above  findings.  IMPRESSION:  1.  Apparent mild wall thickening at the distal sigmoid colon, partially obscured by a bolus of stool in the rectum.  Would consider sigmoidoscopy for further evaluation, when and as deemed clinically appropriate. 2.  Relatively diffuse calcification along the abdominal aorta and its branches, including along the proximal right renal artery. Extensive calcification along the common iliac arteries and their branches, raising concern for significant luminal narrowing bilaterally.  Critical Value/emergent results were called by telephone at the time of interpretation on 06/20/2012 at 01:06 a.m. to Dr. Midge Minium, who verbally acknowledged these results.   Original Report Authenticated By: Tonia Ghent, M.D.    Scheduled Meds: . azithromycin  500 mg Intravenous Q24H  . cefTRIAXone (ROCEPHIN)  IV  1 g Intravenous QHS  . folic acid  1 mg Oral Daily  .  LORazepam  0-4 mg Intravenous Q6H   Followed by  . [START ON 06/21/2012] LORazepam  0-4 mg Intravenous Q12H  . multivitamin with minerals  1 tablet Oral Daily  . nicotine  21 mg Transdermal Daily  . pantoprazole (PROTONIX) IV  40 mg Intravenous QHS  . pneumococcal 23 valent vaccine  0.5 mL Intramuscular Once  . sodium chloride  3 mL Intravenous Q12H  . thiamine  100 mg Oral Daily   Or  . thiamine  100 mg Intravenous Daily   Continuous Infusions: . sodium chloride 125 mL/hr at 06/20/12 0021  . heparin 900 Units/hr (06/20/12 0227)    Principal Problem:   Dehydration Active Problems:   HTN (hypertension)   Tobacco use disorder   Leucocytosis   Alcohol abuse    Time spent: 30 minutes    Bertil Brickey  [facility] Pager [phone]. If 7PM-7AM, please contact night-coverage at [email or web address], password [facility] 06/20/2012, 10:37 AM  LOS: 1 day

## 2012-06-20 NOTE — Progress Notes (Signed)
ANTICOAGULATION CONSULT NOTE - follow up  Pharmacy Consult for heparin Indication: pulmonary embolus  No Known Allergies  Patient Measurements: Height: 5\' 7"  (170.2 cm) Weight: 121 lb 11.1 oz (55.2 kg) IBW/kg (Calculated) : 66.1 Heparin Dosing Weight: 55 kg  Vital Signs: Temp: 98 F (36.7 C) (05/28 0443) Temp src: Oral (05/28 0443) BP: 101/76 mmHg (05/28 0443) Pulse Rate: 87 (05/28 0443)  Labs:  Recent Labs  06/19/12 1803 06/19/12 1804 06/20/12 0505 06/20/12 0840  HGB 15.3  --  12.2*  --   HCT 42.9  --  35.4*  --   PLT 331  --  276  --   LABPROT  --  11.8  --   --   INR  --  0.87  --   --   HEPARINUNFRC  --   --   --  0.30  CREATININE 0.57  --  0.64  --     Estimated Creatinine Clearance: 85.3 ml/min (by C-G formula based on Cr of 0.64).   Medical History: Past Medical History  Diagnosis Date  . History of stomach ulcers   . Hypertension   . GERD (gastroesophageal reflux disease)   . Stroke 01/2012    "left side is nervous; I'm taking PT" (06/19/2012)  . ICH (intracerebral hemorrhage) 02/20/2012    Assessment: 52 yo male who is on heparin per pharmacy for relatively small non-occlusive pulmonary embolus noted within the pulmonary artery branches to the left lower lung lobe.   Due to h/o of intracerebral hemorrhage (01/2012), will not-bolus for now and will consider targeting lower-end of goal heparin range.  He has a large lesion on his tongue which may be biopsied. Plan is to continue heparin until oral surgeon consult, then transition him to LMWH and coumadin for his PE.   His first HL is therapeutic at 0.3 on 900 units/hr with no bolus.  H/H 12.2/35.4 PLTC 276.  No bleeding reported.   Goal of Therapy:  Heparin level 0.3 - 0.7 Monitor platelets by anticoagulation protocol: Yes   Plan:  1. Continue Heparin IV infusion atf 900 units/hr  2. Repeat Heparin level in 6 hours.  3. Daily CBC, heparin level.  4. F/u oral surgeon consult, change to LMWH/coum  after tongue bx. Herby Abraham, Pharm.D. [phone]

## 2012-06-20 NOTE — Progress Notes (Signed)
ANTICOAGULATION CONSULT NOTE - follow up  Pharmacy Consult for heparin Indication: pulmonary embolus  No Known Allergies  Patient Measurements: Height: 5\' 7"  (170.2 cm) Weight: 121 lb 11.1 oz (55.2 kg) IBW/kg (Calculated) : 66.1 Heparin Dosing Weight: 55 kg  Vital Signs: Temp: 98.2 F (36.8 C) (05/28 1403) Temp src: Oral (05/28 1403) BP: 114/77 mmHg (05/28 1403) Pulse Rate: 113 (05/28 1403)  Labs:  Recent Labs  06/19/12 1803 06/19/12 1804 06/20/12 0505 06/20/12 0840 06/20/12 1434  HGB 15.3  --  12.2*  --   --   HCT 42.9  --  35.4*  --   --   PLT 331  --  276  --   --   LABPROT  --  11.8  --   --   --   INR  --  0.87  --   --   --   HEPARINUNFRC  --   --   --  0.30 0.22*  CREATININE 0.57  --  0.64  --   --     Estimated Creatinine Clearance: 85.3 ml/min (by C-G formula based on Cr of 0.64).   Medical History: Past Medical History  Diagnosis Date  . History of stomach ulcers   . Hypertension   . GERD (gastroesophageal reflux disease)   . Stroke 01/2012    "left side is nervous; I'm taking PT" (06/19/2012)  . ICH (intracerebral hemorrhage) 02/20/2012    Assessment: 52 yo male who is on heparin per pharmacy for relatively small non-occlusive pulmonary embolus noted within the pulmonary artery branches to the left lower lung lobe.   Due to h/o of intracerebral hemorrhage (01/2012), will not-bolus for now and will consider targeting lower-end of goal heparin range.  He has a large lesion on his tongue which may be biopsied. Plan is to continue heparin until oral surgeon consult, then transition him to LMWH and coumadin for his PE.   His second HL is  subtherapeutic at 0.22 on 900 units/hr with no bolus.  H/H 12.2/35.4 PLTC 276.  No bleeding reported.   Goal of Therapy:  Heparin level 0.3 - 0.7 Monitor platelets by anticoagulation protocol: Yes   Plan:  1. Increase Heparin IV infusion to 1050 units/hr  2. Repeat Heparin level in 6 hours.  3. Daily CBC, heparin  level.  4. F/u oral surgeon consult, change to LMWH/coum after tongue bx.  Celedonio Miyamoto, PharmD, BCPS Clinical Pharmacist Pager [phone]

## 2012-06-20 NOTE — Progress Notes (Signed)
ANTICOAGULATION CONSULT NOTE - follow up  Pharmacy Consult for heparin Indication: pulmonary embolus  No Known Allergies  Patient Measurements: Height: 5\' 7"  (170.2 cm) Weight: 121 lb 11.1 oz (55.2 kg) IBW/kg (Calculated) : 66.1 Heparin Dosing Weight: 55 kg  Vital Signs: Temp: 99 F (37.2 C) (05/28 2051) Temp src: Oral (05/28 2051) BP: 121/82 mmHg (05/28 2051) Pulse Rate: 81 (05/28 2051)  Labs:  Recent Labs  06/19/12 1803 06/19/12 1804 06/20/12 0505 06/20/12 0840 06/20/12 1434 06/20/12 2255  HGB 15.3  --  12.2*  --   --   --   HCT 42.9  --  35.4*  --   --   --   PLT 331  --  276  --   --   --   LABPROT  --  11.8  --   --   --   --   INR  --  0.87  --   --   --   --   HEPARINUNFRC  --   --   --  0.30 0.22* 0.38  CREATININE 0.57  --  0.64  --   --   --     Estimated Creatinine Clearance: 85.3 ml/min (by C-G formula based on Cr of 0.64).   Medical History: Past Medical History  Diagnosis Date  . History of stomach ulcers   . Hypertension   . GERD (gastroesophageal reflux disease)   . Stroke 01/2012    "left side is nervous; I'm taking PT" (06/19/2012)  . ICH (intracerebral hemorrhage) 02/20/2012    Assessment: 52 yo male who is on heparin per pharmacy for relatively small non-occlusive pulmonary embolus noted within the pulmonary artery branches to the left lower lung lobe. Due to h/o of intracerebral hemorrhage (01/2012), will not-bolus for now and will consider targeting lower-end of goal heparin range.   He has a large lesion on his tongue which may be biopsied. Per otolaryngology note, plan to review CT scan of neck and perform bedside soft tissue biopsy.   Plan is to continue heparin until oral surgeon consult, then transition him to LMWH and coumadin for his PE. Heparin level (0.38) is at-goal on 1050 units/hr. No bleeding reported.   Goal of Therapy:  Heparin level 0.3 - 0.7 Monitor platelets by anticoagulation protocol: Yes   Plan:  1. Continue IV  heparin at 1050 units/hr.  2. Daily CBC, heparin level.  3. F/u oral surgeon consult, change to LMWH/coum after tongue bx.  Lorre Munroe, PharmD 06/20/12, 23:35

## 2012-06-20 NOTE — Care Management Note (Signed)
    Page 1 of 1   06/22/2012     4:15:55 PM   CARE MANAGEMENT NOTE 06/22/2012  Patient:  Jason Strong, Jason Strong   Account Number:  [id number]  Date Initiated:  06/20/2012  Documentation initiated by:  Myangel Summons  Subjective/Objective Assessment:   PT ADM ON 5/27 WITH NEW P.E., TONGUE LESION.  PTA, PT LIVES AT HOME WITH FAMILY.     Action/Plan:   WILL FOLLOW FOR HOME NEEDS AS PT PROGRESSES.   Anticipated DC Date:  06/23/2012   Anticipated DC Plan:  HOME W HOME HEALTH SERVICES      DC Planning Services  CM consult      [facility] Choice  HOME HEALTH   Choice offered to / List presented to:  [name]        HH arranged  HH-1 RN      [facility] agency  [facility]   Status of service:  Completed, signed off Medicare Important Message given?   (If response is "NO", the following Medicare IM given date fields will be blank) Date Medicare IM given:   Date Additional Medicare IM given:    Discharge Disposition:  HOME W HOME HEALTH SERVICES  Per UR Regulation:  Reviewed for med. necessity/level of care/duration of stay  If discussed at Long Length of Stay Meetings, dates discussed:    Comments:  06/22/12 Xaria Judon,RN,BSN [phone] PT FOR DC HOME TODAY ON [place].  NEEDS HHRN FOR PT/INR LAB DRAWS.  REFERRAL TO [facility], PER PT CHOICE; START OF  CARE 5/31.

## 2012-06-20 NOTE — Consult Note (Signed)
ENT CONSULT:  Reason for Consult: Left Tongue Lesion Referring Physician: Hospital service  Jason Strong is an 52 y.o. male.  HPI: The patient is a 52 year old white male admitted to [facility] for evaluation of dehydration and tachycardia. Workup included CT scan of the chest which showed small pulmonary embolism. Patient has had significant issues with tongue ulceration, poor oral intake and dehydration. He is a 30-pack-year smoker and continues ongoing cigarette smoking. He reports a greater than one-month history of painful ulceration in the left lateral tongue. No neck swelling or pain.  Past Medical History  Diagnosis Date  . History of stomach ulcers   . Hypertension   . GERD (gastroesophageal reflux disease)   . Stroke 01/2012    "left side is nervous; I'm taking PT" (06/19/2012)  . ICH (intracerebral hemorrhage) 02/20/2012    Past Surgical History  Procedure Laterality Date  . Craniectomy  02/20/2012    Procedure: CRANIECTOMY POSTERIOR FOSSA DECOMPRESSION;  Surgeon: Temple Pacini, MD;  Location: [facility];  Service: Neurosurgery;  Laterality: N/A;  Craniectomy Posterior Fossa Decompression   . Appendectomy      "when I was young" (06/19/2012)    Family History  Problem Relation Age of Onset  . Cervical cancer Mother   . Stroke Father     Social History:  reports that he has been smoking Cigarettes.  He has a 70 pack-year smoking history. He has never used smokeless tobacco. He reports that he drinks about 9.0 ounces of alcohol per week. His drug history is not on file.  Allergies: No Known Allergies  Medications: I have reviewed the patient's current medications.  Results for orders placed during the hospital encounter of 06/19/12 (from the past 48 hour(s))  GLUCOSE, CAPILLARY     Status: Abnormal   Collection Time    06/19/12  5:11 PM      Result Value Range   Glucose-Capillary 165 (*) 70 - 99 mg/dL  CBC     Status: Abnormal   Collection Time     06/19/12  6:03 PM      Result Value Range   WBC 15.5 (*) 4.0 - 10.5 K/uL   RBC 4.75  4.22 - 5.81 MIL/uL   Hemoglobin 15.3  13.0 - 17.0 g/dL   HCT 16.1  09.6 - 04.5 %   MCV 90.3  78.0 - 100.0 fL   MCH 32.2  26.0 - 34.0 pg   MCHC 35.7  30.0 - 36.0 g/dL   RDW 40.9  81.1 - 91.4 %   Platelets 331  150 - 400 K/uL  COMPREHENSIVE METABOLIC PANEL     Status: Abnormal   Collection Time    06/19/12  6:03 PM      Result Value Range   Sodium 134 (*) 135 - 145 mEq/L   Potassium 3.8  3.5 - 5.1 mEq/L   Chloride 96  96 - 112 mEq/L   CO2 22  19 - 32 mEq/L   Glucose, Bld 139 (*) 70 - 99 mg/dL   BUN 5 (*) 6 - 23 mg/dL   Creatinine, Ser 7.82  0.50 - 1.35 mg/dL   Calcium 95.6  8.4 - 21.3 mg/dL   Total Protein 7.6  6.0 - 8.3 g/dL   Albumin 3.3 (*) 3.5 - 5.2 g/dL   AST 23  0 - 37 U/L   ALT 15  0 - 53 U/L   Alkaline Phosphatase 112  39 - 117 U/L   Total Bilirubin  0.6  0.3 - 1.2 mg/dL   GFR calc non Af Amer >90  >90 mL/min   GFR calc Af Amer >90  >90 mL/min   Comment:            The eGFR has been calculated     using the CKD EPI equation.     This calculation has not been     validated in all clinical     situations.     eGFR's persistently     <90 mL/min signify     possible Chronic Kidney Disease.  PROTIME-INR     Status: None   Collection Time    06/19/12  6:04 PM      Result Value Range   Prothrombin Time 11.8  11.6 - 15.2 seconds   INR 0.87  0.00 - 1.49  D-DIMER, QUANTITATIVE     Status: Abnormal   Collection Time    06/19/12  6:04 PM      Result Value Range   D-Dimer, Quant 1.36 (*) 0.00 - 0.48 ug/mL-FEU   Comment:            AT THE INHOUSE ESTABLISHED CUTOFF     VALUE OF 0.48 ug/mL FEU,     THIS ASSAY HAS BEEN DOCUMENTED     IN THE LITERATURE TO HAVE     A SENSITIVITY AND NEGATIVE     PREDICTIVE VALUE OF AT LEAST     98 TO 99%.  THE TEST RESULT     SHOULD BE CORRELATED WITH     AN ASSESSMENT OF THE CLINICAL     PROBABILITY OF DVT / VTE.  GLUCOSE, CAPILLARY     Status:  Abnormal   Collection Time    06/19/12  6:20 PM      Result Value Range   Glucose-Capillary 126 (*) 70 - 99 mg/dL   Comment 1 Notify RN     Comment 2 Documented in Chart    CG4 I-STAT (LACTIC ACID)     Status: Abnormal   Collection Time    06/19/12  6:49 PM      Result Value Range   Lactic Acid, Venous 2.28 (*) 0.5 - 2.2 mmol/L  POCT I-STAT TROPONIN I     Status: None   Collection Time    06/19/12  6:49 PM      Result Value Range   Troponin i, poc 0.00  0.00 - 0.08 ng/mL   Comment 3            Comment: Due to the release kinetics of cTnI,     a negative result within the first hours     of the onset of symptoms does not rule out     myocardial infarction with certainty.     If myocardial infarction is still suspected,     repeat the test at appropriate intervals.  URINALYSIS, ROUTINE W REFLEX MICROSCOPIC     Status: None   Collection Time    06/19/12  9:31 PM      Result Value Range   Color, Urine YELLOW  YELLOW   APPearance CLEAR  CLEAR   Specific Gravity, Urine 1.006  1.005 - 1.030   pH 7.0  5.0 - 8.0   Glucose, UA NEGATIVE  NEGATIVE mg/dL   Hgb urine dipstick NEGATIVE  NEGATIVE   Bilirubin Urine NEGATIVE  NEGATIVE   Ketones, ur NEGATIVE  NEGATIVE mg/dL   Protein, ur NEGATIVE  NEGATIVE mg/dL   Urobilinogen, UA 0.2  0.0 - 1.0 mg/dL   Nitrite NEGATIVE  NEGATIVE   Leukocytes, UA NEGATIVE  NEGATIVE   Comment: MICROSCOPIC NOT DONE ON URINES WITH NEGATIVE PROTEIN, BLOOD, LEUKOCYTES, NITRITE, OR GLUCOSE <1000 mg/dL.  GLUCOSE, CAPILLARY     Status: Abnormal   Collection Time    06/19/12 10:00 PM      Result Value Range   Glucose-Capillary 113 (*) 70 - 99 mg/dL  GLUCOSE, CAPILLARY     Status: None   Collection Time    06/19/12 11:52 PM      Result Value Range   Glucose-Capillary 94  70 - 99 mg/dL  COMPREHENSIVE METABOLIC PANEL     Status: Abnormal   Collection Time    06/20/12  5:05 AM      Result Value Range   Sodium 137  135 - 145 mEq/L   Potassium 3.6  3.5 - 5.1  mEq/L   Chloride 103  96 - 112 mEq/L   CO2 24  19 - 32 mEq/L   Glucose, Bld 155 (*) 70 - 99 mg/dL   BUN 4 (*) 6 - 23 mg/dL   Creatinine, Ser 9.60  0.50 - 1.35 mg/dL   Calcium 9.1  8.4 - 45.4 mg/dL   Total Protein 5.8 (*) 6.0 - 8.3 g/dL   Albumin 2.5 (*) 3.5 - 5.2 g/dL   AST 17  0 - 37 U/L   ALT 11  0 - 53 U/L   Alkaline Phosphatase 81  39 - 117 U/L   Total Bilirubin 0.3  0.3 - 1.2 mg/dL   GFR calc non Af Amer >90  >90 mL/min   GFR calc Af Amer >90  >90 mL/min   Comment:            The eGFR has been calculated     using the CKD EPI equation.     This calculation has not been     validated in all clinical     situations.     eGFR's persistently     <90 mL/min signify     possible Chronic Kidney Disease.  LACTIC ACID, PLASMA     Status: None   Collection Time    06/20/12  5:05 AM      Result Value Range   Lactic Acid, Venous 1.2  0.5 - 2.2 mmol/L  CBC WITH DIFFERENTIAL     Status: Abnormal   Collection Time    06/20/12  5:05 AM      Result Value Range   WBC 10.1  4.0 - 10.5 K/uL   RBC 3.85 (*) 4.22 - 5.81 MIL/uL   Hemoglobin 12.2 (*) 13.0 - 17.0 g/dL   Comment: DELTA CHECK NOTED     REPEATED TO VERIFY   HCT 35.4 (*) 39.0 - 52.0 %   MCV 91.9  78.0 - 100.0 fL   MCH 31.7  26.0 - 34.0 pg   MCHC 34.5  30.0 - 36.0 g/dL   RDW 09.8  11.9 - 14.7 %   Platelets 276  150 - 400 K/uL   Neutrophils Relative % 55  43 - 77 %   Neutro Abs 5.6  1.7 - 7.7 K/uL   Lymphocytes Relative 34  12 - 46 %   Lymphs Abs 3.5  0.7 - 4.0 K/uL   Monocytes Relative 6  3 - 12 %   Monocytes Absolute 0.7  0.1 - 1.0 K/uL   Eosinophils Relative 4  0 - 5 %   Eosinophils Absolute 0.4  0.0 - 0.7 K/uL   Basophils Relative 0  0 - 1 %   Basophils Absolute 0.0  0.0 - 0.1 K/uL  GLUCOSE, CAPILLARY     Status: Abnormal   Collection Time    06/20/12  6:00 AM      Result Value Range   Glucose-Capillary 121 (*) 70 - 99 mg/dL  HEPARIN LEVEL (UNFRACTIONATED)     Status: None   Collection Time    06/20/12  8:40 AM       Result Value Range   Heparin Unfractionated 0.30  0.30 - 0.70 IU/mL   Comment:            IF HEPARIN RESULTS ARE BELOW     EXPECTED VALUES, AND PATIENT     DOSAGE HAS BEEN CONFIRMED,     SUGGEST FOLLOW UP TESTING     OF ANTITHROMBIN III LEVELS.  GLUCOSE, CAPILLARY     Status: Abnormal   Collection Time    06/20/12 11:20 AM      Result Value Range   Glucose-Capillary 130 (*) 70 - 99 mg/dL   Comment 1 Documented in Chart     Comment 2 Notify RN    HEPARIN LEVEL (UNFRACTIONATED)     Status: Abnormal   Collection Time    06/20/12  2:34 PM      Result Value Range   Heparin Unfractionated 0.22 (*) 0.30 - 0.70 IU/mL   Comment:            IF HEPARIN RESULTS ARE BELOW     EXPECTED VALUES, AND PATIENT     DOSAGE HAS BEEN CONFIRMED,     SUGGEST FOLLOW UP TESTING     OF ANTITHROMBIN III LEVELS.    Dg Chest 2 View  06/19/2012   *RADIOLOGY REPORT*  Clinical Data: Weakness.  CHEST - 2 VIEW  Comparison: Chest x-ray 02/21/2012.  Findings: Lung volumes are normal.  No consolidative airspace disease.  No pleural effusions.  No pneumothorax.  No pulmonary nodule or mass noted. A small area of linear scarring in the right midlung is unchanged.  Pulmonary vasculature and the cardiomediastinal silhouette are within normal limits. Atherosclerosis in the thoracic aorta.  IMPRESSION: 1. No radiographic evidence of acute cardiopulmonary disease. 2.  Atherosclerosis.   Original Report Authenticated By: Trudie Reed, M.D.   Ct Head Wo Contrast  06/19/2012   *RADIOLOGY REPORT*  Clinical Data: History of intracranial hemorrhage.  CT HEAD WITHOUT CONTRAST  Technique:  Contiguous axial images were obtained from the base of the skull through the vertex without contrast.  Comparison: Head CT 02/22/2012.  Findings: Well defined area of low attenuation in the medial aspect of the left cerebellar hemisphere is compatible with encephalomalacia at the site of prior hemorrhage.  Postoperative changes of suboccipital  craniectomy are noted.  No acute intracranial abnormality.  Specifically, no signs of acute intracranial hemorrhage, no mass, mass effect, hydrocephalus or abnormal intra or extra-axial fluid collections.  No definite evidence of acute/subacute cerebral ischemia.  Visualized paranasal sinuses and mastoids are well pneumatized.  IMPRESSION: 1.  Postoperative changes of suboccipital craniectomy for drainage of prior cerebellar vermian hematoma with a small area of encephalomalacia in the medial left cerebellar hemisphere.  No evidence of recurrent hemorrhage or other acute intracranial abnormalities.   Original Report Authenticated By: Trudie Reed, M.D.   Ct Angio Chest Pe W/cm &/or Wo Cm  06/20/2012   *RADIOLOGY REPORT*  Clinical Data:  Leukocytosis; tachycardia.  Concern for pulmonary embolus.  Anorexia.  CT ANGIOGRAPHY CHEST CT ABDOMEN AND PELVIS WITH CONTRAST  Technique:  Multidetector CT imaging of the chest was performed using the standard protocol during bolus administration of intravenous contrast.  Multiplanar CT image reconstructions including MIPs were obtained to evaluate the vascular anatomy. Multidetector CT imaging of the abdomen and pelvis was performed using the standard protocol during bolus administration of intravenous contrast.  Contrast: OMNIPAQUE IOHEXOL 350 MG/ML SOLN,  Comparison:   None.  CTA CHEST  Findings:  A relatively small pulmonary embolus is noted within pulmonary artery branches to the left lower lobe.  This embolus is nonocclusive in nature.  Minimal bilateral atelectasis or scarring is noted, with a few areas of mild nonspecific haziness noted in the left lower lobe. This is nonspecific but could reflect a mild infectious process, or possibly atelectasis.  There is no evidence of pleural effusion or pneumothorax.  No masses are identified; no abnormal focal contrast enhancement is seen.  Scattered mediastinal nodes remain normal in size.  Scattered coronary artery  calcifications are seen.  The esophageal wall appears mildly diffusely thickened, possibly reflecting mild esophagitis.  No pericardial effusion is identified.  The great vessels are grossly unremarkable in appearance.  No axillary lymphadenopathy is seen.  The visualized portions of the thyroid gland are unremarkable in appearance.  No acute osseous abnormalities are seen.   Review of the MIP images confirms the above findings.  IMPRESSION:  1.  Relatively small pulmonary embolus noted than pulmonary artery branches to the left lower lung lobe.  This embolus is nonocclusive in nature. 2.  Minimal bilateral atelectasis or scarring noted.  Few areas of mild nonspecific haziness noted in the left lower lobe.  This could reflect a mild infectious process, or possibly atelectasis. 3.  Mild diffuse esophageal wall thickening may reflect mild esophagitis.  4.  Scattered coronary artery calcifications seen.  CT ABDOMEN AND PELVIS  Findings: The liver and spleen are unremarkable in appearance.  The gallbladder is within normal limits.  The pancreas and adrenal glands are unremarkable.  Scattered calcifications at the renal hila appear to be vascular in nature.  Minimal nonspecific perinephric stranding is noted bilaterally.  The kidneys are otherwise unremarkable in appearance. There is no evidence of hydronephrosis.  No renal or ureteral stones are seen.  No free fluid is identified.  The small bowel is unremarkable in appearance.  The stomach is within normal limits.  No acute vascular abnormalities are seen.  There is relatively diffuse calcification along the abdominal aorta and its branches, including along the proximal right renal artery. Extensive calcification along the common iliac arteries and their branches raises concern for significant luminal narrowing bilaterally.  The patient is status post appendectomy.  Contrast progresses to the level of the proximal sigmoid colon.  The sigmoid colon is redundant.  There  appears to be mild wall thickening at the distal sigmoid colon, though this is partially obscured by a bolus of stool in the rectum.  Would consider sigmoidoscopy for further evaluation, when and as deemed clinically appropriate.  The bladder is moderately distended and grossly unremarkable in appearance.  The prostate remains normal in size, with scattered calcification.  No inguinal lymphadenopathy is seen.  No acute osseous abnormalities are identified.  Review of the MIP images confirms the above findings.  IMPRESSION:  1.  Apparent mild wall thickening at the distal sigmoid colon, partially obscured by a bolus of stool in the rectum.  Would consider sigmoidoscopy for further evaluation, when and as  deemed clinically appropriate. 2.  Relatively diffuse calcification along the abdominal aorta and its branches, including along the proximal right renal artery. Extensive calcification along the common iliac arteries and their branches, raising concern for significant luminal narrowing bilaterally.  Critical Value/emergent results were called by telephone at the time of interpretation on 06/20/2012 at 01:06 a.m. to Dr. Midge Minium, who verbally acknowledged these results.   Original Report Authenticated By: Tonia Ghent, M.D.   Ct Abdomen Pelvis W Contrast  06/20/2012   *RADIOLOGY REPORT*  Clinical Data:  Leukocytosis; tachycardia.  Concern for pulmonary embolus.  Anorexia.  CT ANGIOGRAPHY CHEST CT ABDOMEN AND PELVIS WITH CONTRAST  Technique:  Multidetector CT imaging of the chest was performed using the standard protocol during bolus administration of intravenous contrast.  Multiplanar CT image reconstructions including MIPs were obtained to evaluate the vascular anatomy. Multidetector CT imaging of the abdomen and pelvis was performed using the standard protocol during bolus administration of intravenous contrast.  Contrast: OMNIPAQUE IOHEXOL 350 MG/ML SOLN,  Comparison:   None.  CTA CHEST   Findings:  A relatively small pulmonary embolus is noted within pulmonary artery branches to the left lower lobe.  This embolus is nonocclusive in nature.  Minimal bilateral atelectasis or scarring is noted, with a few areas of mild nonspecific haziness noted in the left lower lobe. This is nonspecific but could reflect a mild infectious process, or possibly atelectasis.  There is no evidence of pleural effusion or pneumothorax.  No masses are identified; no abnormal focal contrast enhancement is seen.  Scattered mediastinal nodes remain normal in size.  Scattered coronary artery calcifications are seen.  The esophageal wall appears mildly diffusely thickened, possibly reflecting mild esophagitis.  No pericardial effusion is identified.  The great vessels are grossly unremarkable in appearance.  No axillary lymphadenopathy is seen.  The visualized portions of the thyroid gland are unremarkable in appearance.  No acute osseous abnormalities are seen.   Review of the MIP images confirms the above findings.  IMPRESSION:  1.  Relatively small pulmonary embolus noted than pulmonary artery branches to the left lower lung lobe.  This embolus is nonocclusive in nature. 2.  Minimal bilateral atelectasis or scarring noted.  Few areas of mild nonspecific haziness noted in the left lower lobe.  This could reflect a mild infectious process, or possibly atelectasis. 3.  Mild diffuse esophageal wall thickening may reflect mild esophagitis.  4.  Scattered coronary artery calcifications seen.  CT ABDOMEN AND PELVIS  Findings: The liver and spleen are unremarkable in appearance.  The gallbladder is within normal limits.  The pancreas and adrenal glands are unremarkable.  Scattered calcifications at the renal hila appear to be vascular in nature.  Minimal nonspecific perinephric stranding is noted bilaterally.  The kidneys are otherwise unremarkable in appearance. There is no evidence of hydronephrosis.  No renal or ureteral stones  are seen.  No free fluid is identified.  The small bowel is unremarkable in appearance.  The stomach is within normal limits.  No acute vascular abnormalities are seen.  There is relatively diffuse calcification along the abdominal aorta and its branches, including along the proximal right renal artery. Extensive calcification along the common iliac arteries and their branches raises concern for significant luminal narrowing bilaterally.  The patient is status post appendectomy.  Contrast progresses to the level of the proximal sigmoid colon.  The sigmoid colon is redundant.  There appears to be mild wall thickening at the distal sigmoid colon, though this  is partially obscured by a bolus of stool in the rectum.  Would consider sigmoidoscopy for further evaluation, when and as deemed clinically appropriate.  The bladder is moderately distended and grossly unremarkable in appearance.  The prostate remains normal in size, with scattered calcification.  No inguinal lymphadenopathy is seen.  No acute osseous abnormalities are identified.  Review of the MIP images confirms the above findings.  IMPRESSION:  1.  Apparent mild wall thickening at the distal sigmoid colon, partially obscured by a bolus of stool in the rectum.  Would consider sigmoidoscopy for further evaluation, when and as deemed clinically appropriate. 2.  Relatively diffuse calcification along the abdominal aorta and its branches, including along the proximal right renal artery. Extensive calcification along the common iliac arteries and their branches, raising concern for significant luminal narrowing bilaterally.  Critical Value/emergent results were called by telephone at the time of interpretation on 06/20/2012 at 01:06 a.m. to Dr. Midge Minium, who verbally acknowledged these results.   Original Report Authenticated By: Tonia Ghent, M.D.    ROS:per patient's admission, no recent change or additional active symptoms or concerns. No  respiratory difficulty, dysphagia, odynophagia or otalgia. The patient is disabled after recent intracranial surgery.   Blood pressure 114/77, pulse 113, temperature 98.2 F (36.8 C), temperature source Oral, resp. rate 18, height 5\' 7"  (1.702 m), weight 55.2 kg (121 lb 11.1 oz), SpO2 98.00%.  PHYSICAL EXAM: General appearance - oriented to person, place, and time, dehydrated and anxious Nose - normal and patent, no erythema, discharge or polyps Mouth - Very poor oral hygiene with extensive dental caries, 4 cm ulcerated lesion involving the left lateral tongue, floor of mouth and left perimandibular region. Limited tongue mobility, base of tongue appears normal and intact. Neck - supple, no significant adenopathy, thyroid exam: thyroid is normal in size without nodules or tenderness, nontender fullness in the left submandibular region.  Studies Reviewed:CT scans as noted above, patient scheduled to undergo CT scan of the neck to better assess oral cavity and possible adenopathy.  Assessment/Plan: The patient is admitted for dehydration and tachycardia with findings of small ulnar embolism and is currently on anticoagulation therapy. He presented with a greater than one-month history of left lateral tongue pain and ulcerated lesion. Physical examination shows extensive ulcerated mass which extends from the left lateral, across the floor of mouth and involves the left perimandibular region with possible associated left submandibular adenopathy. CT scan of the neck pending. Plan to review CT scan and perform bedside soft tissue biopsy of the left tongue lesion for pathologic diagnosis. 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 Further workup and treatment recommendations based on above testing and pathologic findings.  Cher Franzoni 06/20/2012, 5:43 PM

## 2012-06-20 NOTE — Progress Notes (Signed)
ANTICOAGULATION CONSULT NOTE - Initial Consult  Pharmacy Consult for heparin Indication: pulmonary embolus  No Known Allergies  Patient Measurements: Height: 5\' 7"  (170.2 cm) Weight: 121 lb 11.1 oz (55.2 kg) IBW/kg (Calculated) : 66.1 Heparin Dosing Weight: 55 kg  Vital Signs: Temp: 98.1 F (36.7 C) (05/27 2107) Temp src: Oral (05/27 2107) BP: 128/90 mmHg (05/27 2107) Pulse Rate: 104 (05/27 2107)  Labs:  Recent Labs  06/19/12 1803 06/19/12 1804  HGB 15.3  --   HCT 42.9  --   PLT 331  --   LABPROT  --  11.8  INR  --  0.87  CREATININE 0.57  --     Estimated Creatinine Clearance: 85.3 ml/min (by C-G formula based on Cr of 0.57).   Medical History: Past Medical History  Diagnosis Date  . History of stomach ulcers   . Hypertension   . GERD (gastroesophageal reflux disease)   . Stroke 01/2012    "left side is nervous; I'm taking PT" (06/19/2012)  . ICH (intracerebral hemorrhage) 02/20/2012    Medications:  Scheduled:  . sodium chloride   Intravenous STAT  . folic acid  1 mg Oral Daily  . LORazepam  0-4 mg Intravenous Q6H   Followed by  . [START ON 06/21/2012] LORazepam  0-4 mg Intravenous Q12H  . multivitamin with minerals  1 tablet Oral Daily  . pneumococcal 23 valent vaccine  0.5 mL Intramuscular Once  . sodium chloride  3 mL Intravenous Q12H  . thiamine  100 mg Oral Daily   Or  . thiamine  100 mg Intravenous Daily    Assessment: 52 yo male who presented with abnormal labs from 5/15 (elevated WBC and blood glucose). D-dimer 1.36. Per chest CTA, patient with relatively small non-occlusive pulmonary embolus noted than pulmonary artery branches to the left lower lung lobe. Pharmacy to manage IV heparin. Due to h/o of intracerebral hemorrhage (01/2012), will not-bolus for now and will consider targeting lower-end of goal heparin range.   Goal of Therapy:  Heparin level 0.3 - 0.7 Monitor platelets by anticoagulation protocol: Yes   Plan:  1. Heparin IV  infusion of 900 units/hr  2. Heparin level in 6 hours.  3. Daily CBC, heparin level.   Emeline Gins 06/20/2012,1:13 AM

## 2012-06-21 ENCOUNTER — Encounter: Payer: Self-pay | Admitting: *Deleted

## 2012-06-21 DIAGNOSIS — E43 Unspecified severe protein-calorie malnutrition: Secondary | ICD-10-CM | POA: Diagnosis present

## 2012-06-21 DIAGNOSIS — F17200 Nicotine dependence, unspecified, uncomplicated: Secondary | ICD-10-CM

## 2012-06-21 DIAGNOSIS — I2699 Other pulmonary embolism without acute cor pulmonale: Principal | ICD-10-CM

## 2012-06-21 LAB — CBC
HCT: 34 % — ABNORMAL LOW (ref 39.0–52.0)
MCH: 31.6 pg (ref 26.0–34.0)
MCHC: 34.4 g/dL (ref 30.0–36.0)
MCV: 91.9 fL (ref 78.0–100.0)
RDW: 15.4 % (ref 11.5–15.5)

## 2012-06-21 LAB — GLUCOSE, CAPILLARY: Glucose-Capillary: 115 mg/dL — ABNORMAL HIGH (ref 70–99)

## 2012-06-21 MED ORDER — WARFARIN VIDEO
Freq: Once | Status: AC
  Administered 2012-06-21: 18:00:00

## 2012-06-21 MED ORDER — PATIENT'S GUIDE TO USING COUMADIN BOOK
Freq: Once | Status: AC
  Administered 2012-06-21: 18:00:00
  Filled 2012-06-21: qty 1

## 2012-06-21 MED ORDER — WARFARIN SODIUM 7.5 MG PO TABS
7.5000 mg | ORAL_TABLET | Freq: Once | ORAL | Status: AC
  Administered 2012-06-21: 7.5 mg via ORAL
  Filled 2012-06-21: qty 1

## 2012-06-21 MED ORDER — WARFARIN - PHARMACIST DOSING INPATIENT
Freq: Every day | Status: DC
  Administered 2012-06-22: 18:00:00

## 2012-06-21 MED ORDER — ENOXAPARIN SODIUM 80 MG/0.8ML ~~LOC~~ SOLN
80.0000 mg | SUBCUTANEOUS | Status: DC
  Administered 2012-06-21 – 2012-06-22 (×2): 80 mg via SUBCUTANEOUS
  Filled 2012-06-21 (×2): qty 0.8

## 2012-06-21 NOTE — Progress Notes (Signed)
ANTICOAGULATION CONSULT NOTE - follow up  Pharmacy Consult for LMWH and coumadin Indication: pulmonary embolus  No Known Allergies  Patient Measurements: Height: 5\' 7"  (170.2 cm) Weight: 121 lb 11.1 oz (55.2 kg) IBW/kg (Calculated) : 66.1 Heparin Dosing Weight: 55 kg  Vital Signs: Temp: 98 F (36.7 C) (05/29 1410) Temp src: Oral (05/29 1410) BP: 129/77 mmHg (05/29 1410) Pulse Rate: 84 (05/29 1410)  Labs:  Recent Labs  06/19/12 1803 06/19/12 1804 06/20/12 0505  06/20/12 1434 06/20/12 2255 06/21/12 0410  HGB 15.3  --  12.2*  --   --   --  11.7*  HCT 42.9  --  35.4*  --   --   --  34.0*  PLT 331  --  276  --   --   --  257  LABPROT  --  11.8  --   --   --   --   --   INR  --  0.87  --   --   --   --   --   HEPARINUNFRC  --   --   --   < > 0.22* 0.38 0.34  CREATININE 0.57  --  0.64  --   --   --   --   < > = values in this interval not displayed.  Estimated Creatinine Clearance: 85.3 ml/min (by C-G formula based on Cr of 0.64).   Medical History: Past Medical History  Diagnosis Date  . History of stomach ulcers   . Hypertension   . GERD (gastroesophageal reflux disease)   . Stroke 01/2012    "left side is nervous; I'm taking PT" (06/19/2012)  . ICH (intracerebral hemorrhage) 02/20/2012    Assessment: 52 yo male who is on heparin per pharmacy for relatively small non-occlusive pulmonary embolus noted within the pulmonary artery branches to the left lower lung lobe. h/o of intracerebral hemorrhage (01/2012) noted. He is s/p biopsy of left lateral tongue lesion today with minimal bleeding. 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 He will be transitioned from IV heparin to LMWH and coumadin tonight.   Goal of Therapy:  INR 2-3 Monitor platelets by anticoagulation protocol: Yes   Plan:  1. LMWH 1.5 mg/kg/24 hrs = 80 mg sq q24 hrs 2. Coumadin 7.5 mg po x 1 dose tonight 3. Coumadin book and video for education 4. Daily INR 5. He needs a minimum of  5 day overlap of coumadin and LMWH for PE 6. Can rocephin and azithromycin be stopped?  Herby Abraham, Pharm.D. [phone] 06/21/2012 5:03 PM

## 2012-06-21 NOTE — Progress Notes (Signed)
[facility] PROGRESS NOTE  Jason Strong:[id number] DOB: July 11, 1960 DOA: 06/19/2012 PCP: Redmond Baseman, MD  Assessment/Plan:  1. Dehydration - secondary to poor oral intake.Continued IV fluids. At this time patient has been placed on clear liquids. Patient's poor oral intake is due to the tongue lesion.  If patient cannot take by mouth then will need PEG tube placement.    2. Leukocytosis -  Resolved. patient is afebrile chest x-ray is unremarkable. UA is negative for infection.. CT angiogram of the chest and abdomen  Showed a small PE in the left lower lobe and haziness consistent with infectious process. So he was started on IV heparin and IV antibiotics. His IV heparin transitioned to Lovenox inj and coumadin.   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  ENT consulted for biopsy. Done today and results pending.  4. Tachycardia and elevated d-dimer - CT angiogram of the chest shows small PE on IV heparin.  5.. History of hypertension - due to poor oral intake at this time I have placed patient on when necessary IV hydralazine for systolic blood pressure more than 140 given the history of cerebellar hemorrhage.  6. Alcohol and tobacco abuse - patient advised to quit this habit. Patient is placed on alcohol withdrawal protocol.  7. 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 Patient does not have any focal deficits or headache.  Code Status: full code Family Communication: none at bedside Disposition Plan: pending.    Consultants:  ENT   Procedures:  Biopsy of the tongue lesion.  Antibiotics:  Rocephin   zithromax   HPI/Subjective: pain in the mouth is improved.   Objective: Filed Vitals:   06/20/12 0443 06/20/12 1403 06/20/12 2051 06/21/12 0512  BP: 101/76 114/77 121/82 125/83  Pulse: 87 113 81 80  Temp: 98 F (36.7 C) 98.2 F (36.8 C) 99 F (37.2 C) 97.9 F (36.6 C)   TempSrc: Oral Oral Oral Oral  Resp: 18 18 18 18   Height:      Weight:      SpO2: 97% 98% 96% 95%    Intake/Output Summary (Last 24 hours) at 06/21/12 0824 Last data filed at 06/21/12 0500  Gross per 24 hour  Intake   1095 ml  Output   1975 ml  Net   -880 ml   Filed Weights   06/19/12 2324  Weight: 55.2 kg (121 lb 11.1 oz)    Exam:  General: Well-developed and mal nourished.  Eyes: Anicteric no pallor.  ENT: There is a large lesion in the left border of the tongue. Cardiovascular: S1-S2 heard.  Respiratory: No rhonchi or crepitations.  Abdomen: Soft nontender bowel sounds present Musculoskeletal: No edema   Data Reviewed: Basic Metabolic Panel:  Recent Labs Lab 06/19/12 1803 06/20/12 0505  NA 134* 137  K 3.8 3.6  CL 96 103  CO2 22 24  GLUCOSE 139* 155*  BUN 5* 4*  CREATININE 0.57 0.64  CALCIUM 10.0 9.1   Liver Function Tests:  Recent Labs Lab 06/19/12 1803 06/20/12 0505  AST 23 17  ALT 15 11  ALKPHOS 112 81  BILITOT 0.6 0.3  PROT 7.6 5.8*  ALBUMIN 3.3* 2.5*   No results found for this basename: LIPASE, AMYLASE,  in the last 168 hours No results found for this basename: AMMONIA,  in the last 168 hours CBC:  Recent Labs Lab 06/19/12 1803 06/20/12 0505 06/21/12 0410  WBC 15.5* 10.1 8.9  NEUTROABS  --  5.6  --   HGB 15.3 12.2* 11.7*  HCT 42.9 35.4* 34.0*  MCV 90.3 91.9 91.9  PLT 331 276 257   Cardiac Enzymes: No results found for this basename: CKTOTAL, CKMB, CKMBINDEX, TROPONINI,  in the last 168 hours BNP (last 3 results) No results found for this basename: PROBNP,  in the last 8760 hours CBG:  Recent Labs Lab 06/19/12 1820 06/19/12 2200 06/19/12 2352 06/20/12 0600 06/20/12 1120  GLUCAP 126* 113* 94 121* 130*    No results found for this or any previous visit (from the past 240 hour(s)).   Studies: Dg Chest 2 View  06/19/2012   *RADIOLOGY REPORT*  Clinical Data: Weakness.  CHEST - 2 VIEW  Comparison: Chest x-ray 02/21/2012.   Findings: Lung volumes are normal.  No consolidative airspace disease.  No pleural effusions.  No pneumothorax.  No pulmonary nodule or mass noted. A small area of linear scarring in the right midlung is unchanged.  Pulmonary vasculature and the cardiomediastinal silhouette are within normal limits. Atherosclerosis in the thoracic aorta.  IMPRESSION: 1. No radiographic evidence of acute cardiopulmonary disease. 2.  Atherosclerosis.   Original Report Authenticated By: Trudie Reed, M.D.   Ct Head Wo Contrast  06/19/2012   *RADIOLOGY REPORT*  Clinical Data: History of intracranial hemorrhage.  CT HEAD WITHOUT CONTRAST  Technique:  Contiguous axial images were obtained from the base of the skull through the vertex without contrast.  Comparison: Head CT 02/22/2012.  Findings: Well defined area of low attenuation in the medial aspect of the left cerebellar hemisphere is compatible with encephalomalacia at the site of prior hemorrhage.  Postoperative changes of suboccipital craniectomy are noted.  No acute intracranial abnormality.  Specifically, no signs of acute intracranial hemorrhage, no mass, mass effect, hydrocephalus or abnormal intra or extra-axial fluid collections.  No definite evidence of acute/subacute cerebral ischemia.  Visualized paranasal sinuses and mastoids are well pneumatized.  IMPRESSION: 1.  Postoperative changes of suboccipital craniectomy for drainage of prior cerebellar vermian hematoma with a small area of encephalomalacia in the medial left cerebellar hemisphere.  No evidence of recurrent hemorrhage or other acute intracranial abnormalities.   Original Report Authenticated By: Trudie Reed, M.D.   Ct Angio Chest Pe W/cm &/or Wo Cm  06/20/2012   *RADIOLOGY REPORT*  Clinical Data:  Leukocytosis; tachycardia.  Concern for pulmonary embolus.  Anorexia.  CT ANGIOGRAPHY CHEST CT ABDOMEN AND PELVIS WITH CONTRAST  Technique:  Multidetector CT imaging of the chest was performed using the  standard protocol during bolus administration of intravenous contrast.  Multiplanar CT image reconstructions including MIPs were obtained to evaluate the vascular anatomy. Multidetector CT imaging of the abdomen and pelvis was performed using the standard protocol during bolus administration of intravenous contrast.  Contrast: OMNIPAQUE IOHEXOL 350 MG/ML SOLN,  Comparison:   None.  CTA CHEST  Findings:  A relatively small pulmonary embolus is noted within pulmonary artery branches to the left lower lobe.  This embolus is nonocclusive in nature.  Minimal bilateral atelectasis or scarring is noted, with a few areas of mild nonspecific haziness noted in the left lower lobe. This is nonspecific but could reflect a mild infectious process, or possibly atelectasis.  There is no evidence of pleural effusion or pneumothorax.  No masses are identified; no abnormal focal contrast enhancement is seen.  Scattered mediastinal nodes remain normal in size.  Scattered coronary artery calcifications are seen.  The esophageal wall appears mildly diffusely thickened, possibly reflecting mild  esophagitis.  No pericardial effusion is identified.  The great vessels are grossly unremarkable in appearance.  No axillary lymphadenopathy is seen.  The visualized portions of the thyroid gland are unremarkable in appearance.  No acute osseous abnormalities are seen.   Review of the MIP images confirms the above findings.  IMPRESSION:  1.  Relatively small pulmonary embolus noted than pulmonary artery branches to the left lower lung lobe.  This embolus is nonocclusive in nature. 2.  Minimal bilateral atelectasis or scarring noted.  Few areas of mild nonspecific haziness noted in the left lower lobe.  This could reflect a mild infectious process, or possibly atelectasis. 3.  Mild diffuse esophageal wall thickening may reflect mild esophagitis.  4.  Scattered coronary artery calcifications seen.  CT ABDOMEN AND PELVIS  Findings: The liver  and spleen are unremarkable in appearance.  The gallbladder is within normal limits.  The pancreas and adrenal glands are unremarkable.  Scattered calcifications at the renal hila appear to be vascular in nature.  Minimal nonspecific perinephric stranding is noted bilaterally.  The kidneys are otherwise unremarkable in appearance. There is no evidence of hydronephrosis.  No renal or ureteral stones are seen.  No free fluid is identified.  The small bowel is unremarkable in appearance.  The stomach is within normal limits.  No acute vascular abnormalities are seen.  There is relatively diffuse calcification along the abdominal aorta and its branches, including along the proximal right renal artery. Extensive calcification along the common iliac arteries and their branches raises concern for significant luminal narrowing bilaterally.  The patient is status post appendectomy.  Contrast progresses to the level of the proximal sigmoid colon.  The sigmoid colon is redundant.  There appears to be mild wall thickening at the distal sigmoid colon, though this is partially obscured by a bolus of stool in the rectum.  Would consider sigmoidoscopy for further evaluation, when and as deemed clinically appropriate.  The bladder is moderately distended and grossly unremarkable in appearance.  The prostate remains normal in size, with scattered calcification.  No inguinal lymphadenopathy is seen.  No acute osseous abnormalities are identified.  Review of the MIP images confirms the above findings.  IMPRESSION:  1.  Apparent mild wall thickening at the distal sigmoid colon, partially obscured by a bolus of stool in the rectum.  Would consider sigmoidoscopy for further evaluation, when and as deemed clinically appropriate. 2.  Relatively diffuse calcification along the abdominal aorta and its branches, including along the proximal right renal artery. Extensive calcification along the common iliac arteries and their branches, raising  concern for significant luminal narrowing bilaterally.  Critical Value/emergent results were called by telephone at the time of interpretation on 06/20/2012 at 01:06 a.m. to Dr. Midge Minium, who verbally acknowledged these results.   Original Report Authenticated By: Tonia Ghent, M.D.   Ct Abdomen Pelvis W Contrast  06/20/2012   *RADIOLOGY REPORT*  Clinical Data:  Leukocytosis; tachycardia.  Concern for pulmonary embolus.  Anorexia.  CT ANGIOGRAPHY CHEST CT ABDOMEN AND PELVIS WITH CONTRAST  Technique:  Multidetector CT imaging of the chest was performed using the standard protocol during bolus administration of intravenous contrast.  Multiplanar CT image reconstructions including MIPs were obtained to evaluate the vascular anatomy. Multidetector CT imaging of the abdomen and pelvis was performed using the standard protocol during bolus administration of intravenous contrast.  Contrast: OMNIPAQUE IOHEXOL 350 MG/ML SOLN,  Comparison:   None.  CTA CHEST  Findings:  A relatively small pulmonary  embolus is noted within pulmonary artery branches to the left lower lobe.  This embolus is nonocclusive in nature.  Minimal bilateral atelectasis or scarring is noted, with a few areas of mild nonspecific haziness noted in the left lower lobe. This is nonspecific but could reflect a mild infectious process, or possibly atelectasis.  There is no evidence of pleural effusion or pneumothorax.  No masses are identified; no abnormal focal contrast enhancement is seen.  Scattered mediastinal nodes remain normal in size.  Scattered coronary artery calcifications are seen.  The esophageal wall appears mildly diffusely thickened, possibly reflecting mild esophagitis.  No pericardial effusion is identified.  The great vessels are grossly unremarkable in appearance.  No axillary lymphadenopathy is seen.  The visualized portions of the thyroid gland are unremarkable in appearance.  No acute osseous abnormalities are seen.    Review of the MIP images confirms the above findings.  IMPRESSION:  1.  Relatively small pulmonary embolus noted than pulmonary artery branches to the left lower lung lobe.  This embolus is nonocclusive in nature. 2.  Minimal bilateral atelectasis or scarring noted.  Few areas of mild nonspecific haziness noted in the left lower lobe.  This could reflect a mild infectious process, or possibly atelectasis. 3.  Mild diffuse esophageal wall thickening may reflect mild esophagitis.  4.  Scattered coronary artery calcifications seen.  CT ABDOMEN AND PELVIS  Findings: The liver and spleen are unremarkable in appearance.  The gallbladder is within normal limits.  The pancreas and adrenal glands are unremarkable.  Scattered calcifications at the renal hila appear to be vascular in nature.  Minimal nonspecific perinephric stranding is noted bilaterally.  The kidneys are otherwise unremarkable in appearance. There is no evidence of hydronephrosis.  No renal or ureteral stones are seen.  No free fluid is identified.  The small bowel is unremarkable in appearance.  The stomach is within normal limits.  No acute vascular abnormalities are seen.  There is relatively diffuse calcification along the abdominal aorta and its branches, including along the proximal right renal artery. Extensive calcification along the common iliac arteries and their branches raises concern for significant luminal narrowing bilaterally.  The patient is status post appendectomy.  Contrast progresses to the level of the proximal sigmoid colon.  The sigmoid colon is redundant.  There appears to be mild wall thickening at the distal sigmoid colon, though this is partially obscured by a bolus of stool in the rectum.  Would consider sigmoidoscopy for further evaluation, when and as deemed clinically appropriate.  The bladder is moderately distended and grossly unremarkable in appearance.  The prostate remains normal in size, with scattered calcification.  No  inguinal lymphadenopathy is seen.  No acute osseous abnormalities are identified.  Review of the MIP images confirms the above findings.  IMPRESSION:  1.  Apparent mild wall thickening at the distal sigmoid colon, partially obscured by a bolus of stool in the rectum.  Would consider sigmoidoscopy for further evaluation, when and as deemed clinically appropriate. 2.  Relatively diffuse calcification along the abdominal aorta and its branches, including along the proximal right renal artery. Extensive calcification along the common iliac arteries and their branches, raising concern for significant luminal narrowing bilaterally.  Critical Value/emergent results were called by telephone at the time of interpretation on 06/20/2012 at 01:06 a.m. to Dr. Midge Minium, who verbally acknowledged these results.   Original Report Authenticated By: Tonia Ghent, M.D.    Scheduled Meds: . azithromycin  500 mg Intravenous Q24H  .  cefTRIAXone (ROCEPHIN)  IV  1 g Intravenous QHS  . feeding supplement  1 Container Oral BID BM  . folic acid  1 mg Oral Daily  . lidocaine-EPINEPHrine  20 mL Intradermal Once  . LORazepam  0-4 mg Intravenous Q6H   Followed by  . LORazepam  0-4 mg Intravenous Q12H  . multivitamin with minerals  1 tablet Oral Daily  . nicotine  21 mg Transdermal Daily  . pantoprazole (PROTONIX) IV  40 mg Intravenous QHS  . pneumococcal 23 valent vaccine  0.5 mL Intramuscular Once  . sodium chloride  3 mL Intravenous Q12H  . thiamine  100 mg Oral Daily   Or  . thiamine  100 mg Intravenous Daily   Continuous Infusions: . heparin 1,050 Units/hr (06/20/12 1717)    Principal Problem:   Dehydration Active Problems:   HTN (hypertension)   Tobacco use disorder   Leucocytosis   Alcohol abuse   Acute pulmonary embolism   CAP (community acquired pneumonia)   Tongue lesion    Time spent: 30 minutes    Asbury Hair  [facility] Pager [phone]. If 7PM-7AM, please contact  night-coverage at [email or web address], password [facility] 06/21/2012, 8:24 AM  LOS: 2 days

## 2012-06-21 NOTE — Progress Notes (Signed)
ENT Progress Note:  CC: Left Tongue Lesion   Subjective: C/O tongue pain and difficulty swallowing  Objective: Vital signs in last 24 hours: Temp:  [97.9 F (36.6 C)-99 F (37.2 C)] 97.9 F (36.6 C) (05/29 0512) Pulse Rate:  [80-113] 80 (05/29 0512) Resp:  [18] 18 (05/29 0512) BP: (114-125)/(77-83) 125/83 mmHg (05/29 0512) SpO2:  [95 %-98 %] 95 % (05/29 0512) Weight change:  Last BM Date: 06/19/12  Intake/Output from previous day: 05/28 0701 - 05/29 0700 In: 1215 [P.O.:240; I.V.:975] Out: 2275 [Urine:2275] Intake/Output this shift: Total I/O In: 1025.6 [P.O.:360; I.V.:65.6; Other:600] Out: 1475 [Urine:1475]  Labs:  Recent Labs  06/20/12 0505 06/21/12 0410  WBC 10.1 8.9  HGB 12.2* 11.7*  HCT 35.4* 34.0*  PLT 276 257    Recent Labs  06/19/12 1803 06/20/12 0505  NA 134* 137  K 3.8 3.6  CL 96 103  CO2 22 24  GLUCOSE 139* 155*  BUN 5* 4*  CALCIUM 10.0 9.1    Studies/Results: Dg Chest 2 View  06/19/2012   *RADIOLOGY REPORT*  Clinical Data: Weakness.  CHEST - 2 VIEW  Comparison: Chest x-ray 02/21/2012.  Findings: Lung volumes are normal.  No consolidative airspace disease.  No pleural effusions.  No pneumothorax.  No pulmonary nodule or mass noted. A small area of linear scarring in the right midlung is unchanged.  Pulmonary vasculature and the cardiomediastinal silhouette are within normal limits. Atherosclerosis in the thoracic aorta.  IMPRESSION: 1. No radiographic evidence of acute cardiopulmonary disease. 2.  Atherosclerosis.   Original Report Authenticated By: Trudie Reed, M.D.   Ct Head Wo Contrast  06/19/2012   *RADIOLOGY REPORT*  Clinical Data: History of intracranial hemorrhage.  CT HEAD WITHOUT CONTRAST  Technique:  Contiguous axial images were obtained from the base of the skull through the vertex without contrast.  Comparison: Head CT 02/22/2012.  Findings: Well defined area of low attenuation in the medial aspect of the left cerebellar  hemisphere is compatible with encephalomalacia at the site of prior hemorrhage.  Postoperative changes of suboccipital craniectomy are noted.  No acute intracranial abnormality.  Specifically, no signs of acute intracranial hemorrhage, no mass, mass effect, hydrocephalus or abnormal intra or extra-axial fluid collections.  No definite evidence of acute/subacute cerebral ischemia.  Visualized paranasal sinuses and mastoids are well pneumatized.  IMPRESSION: 1.  Postoperative changes of suboccipital craniectomy for drainage of prior cerebellar vermian hematoma with a small area of encephalomalacia in the medial left cerebellar hemisphere.  No evidence of recurrent hemorrhage or other acute intracranial abnormalities.   Original Report Authenticated By: Trudie Reed, M.D.   Ct Soft Tissue Neck W Contrast  06/21/2012   *RADIOLOGY REPORT*  Clinical Data: Left tongue mass.  CT NECK WITH CONTRAST  Technique:  Multidetector CT imaging of the neck was performed with intravenous contrast.  Contrast: 75mL OMNIPAQUE IOHEXOL 300 MG/ML  SOLN  Comparison: None.  Findings: There is a mass involving the left lateral tongue.  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  The   measures approximately 15 x 21 x 30 mm.  Base of the tongue is intact.  Left submandibular node measures 17 mm and has mixed density.  This is highly suspicious for metastatic spread.  Submental node measures 7 mm and could also be due to early tumor spread.  9 x 15 mm lymph node lateral to the left submandibular gland has a fatty hilum and  is probably benign. However given this close proximity to the malignant node, tumor not excluded.  No pathologic lymph nodes are present on the right.  No other enlarged lymph nodes are present on the left.  Submandibular and parotid glands are normal bilaterally.  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  The larynx appears normal.  Thyroid appears normal.  Lung apices are clear.  Congenital fusion of C2 and C3.  There is spondylosis at C3-4.  No acute bony change.  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  Pathologic submandibular lymph node measuring 17 mm. Additional small submandibular and submental nodes on the left indeterminate for malignant extension.  Suboccipital craniectomy with a small pseudomeningocele.   Original Report Authenticated By: Janeece Riggers, M.D.   Ct Angio Chest Pe W/cm &/or Wo Cm  06/20/2012   *RADIOLOGY REPORT*  Clinical Data:  Leukocytosis; tachycardia.  Concern for pulmonary embolus.  Anorexia.  CT ANGIOGRAPHY CHEST CT ABDOMEN AND PELVIS WITH CONTRAST  Technique:  Multidetector CT imaging of the chest was performed using the standard protocol during bolus administration of intravenous contrast.  Multiplanar CT image reconstructions including MIPs were obtained to evaluate the vascular anatomy. Multidetector CT imaging of the abdomen and pelvis was performed using the standard protocol during bolus administration of intravenous contrast.  Contrast: OMNIPAQUE IOHEXOL 350 MG/ML SOLN,  Comparison:   None.  CTA CHEST  Findings:  A relatively small pulmonary embolus is noted within pulmonary artery branches to the left lower lobe.  This embolus is nonocclusive in nature.  Minimal bilateral atelectasis or scarring is noted, with a few areas of mild nonspecific haziness noted in the left lower lobe. This is nonspecific but could reflect a mild infectious process, or possibly atelectasis.  There is no evidence of pleural effusion or pneumothorax.  No masses are identified; no abnormal focal contrast enhancement is seen.  Scattered mediastinal nodes remain normal in size.  Scattered coronary artery calcifications are seen.  The esophageal wall appears mildly diffusely thickened, possibly reflecting mild  esophagitis.  No pericardial effusion is identified.  The great vessels are grossly unremarkable in appearance.  No axillary lymphadenopathy is seen.  The visualized portions of the thyroid gland are unremarkable in appearance.  No acute osseous abnormalities are seen.   Review of the MIP images confirms the above findings.  IMPRESSION:  1.  Relatively small pulmonary embolus noted than pulmonary artery branches to the left lower lung lobe.  This embolus is nonocclusive in nature. 2.  Minimal bilateral atelectasis or scarring noted.  Few areas of mild nonspecific haziness noted in the left lower lobe.  This could reflect a mild infectious process, or possibly atelectasis. 3.  Mild diffuse esophageal wall thickening may reflect mild esophagitis.  4.  Scattered coronary artery calcifications seen.  CT ABDOMEN AND PELVIS  Findings: The liver and spleen are unremarkable in appearance.  The gallbladder is within normal limits.  The pancreas and adrenal glands are unremarkable.  Scattered calcifications at the renal hila appear to be vascular in nature.  Minimal nonspecific perinephric stranding is noted bilaterally.  The kidneys are otherwise unremarkable in appearance. There is no evidence of hydronephrosis.  No renal or ureteral stones are seen.  No free fluid is identified.  The small bowel is unremarkable in appearance.  The stomach is within normal limits.  No acute vascular abnormalities are seen.  There is relatively diffuse calcification along  the abdominal aorta and its branches, including along the proximal right renal artery. Extensive calcification along the common iliac arteries and their branches raises concern for significant luminal narrowing bilaterally.  The patient is status post appendectomy.  Contrast progresses to the level of the proximal sigmoid colon.  The sigmoid colon is redundant.  There appears to be mild wall thickening at the distal sigmoid colon, though this is partially obscured by a  bolus of stool in the rectum.  Would consider sigmoidoscopy for further evaluation, when and as deemed clinically appropriate.  The bladder is moderately distended and grossly unremarkable in appearance.  The prostate remains normal in size, with scattered calcification.  No inguinal lymphadenopathy is seen.  No acute osseous abnormalities are identified.  Review of the MIP images confirms the above findings.  IMPRESSION:  1.  Apparent mild wall thickening at the distal sigmoid colon, partially obscured by a bolus of stool in the rectum.  Would consider sigmoidoscopy for further evaluation, when and as deemed clinically appropriate. 2.  Relatively diffuse calcification along the abdominal aorta and its branches, including along the proximal right renal artery. Extensive calcification along the common iliac arteries and their branches, raising concern for significant luminal narrowing bilaterally.  Critical Value/emergent results were called by telephone at the time of interpretation on 06/20/2012 at 01:06 a.m. to Dr. Midge Minium, who verbally acknowledged these results.   Original Report Authenticated By: Tonia Ghent, M.D.   Ct Abdomen Pelvis W Contrast  06/20/2012   *RADIOLOGY REPORT*  Clinical Data:  Leukocytosis; tachycardia.  Concern for pulmonary embolus.  Anorexia.  CT ANGIOGRAPHY CHEST CT ABDOMEN AND PELVIS WITH CONTRAST  Technique:  Multidetector CT imaging of the chest was performed using the standard protocol during bolus administration of intravenous contrast.  Multiplanar CT image reconstructions including MIPs were obtained to evaluate the vascular anatomy. Multidetector CT imaging of the abdomen and pelvis was performed using the standard protocol during bolus administration of intravenous contrast.  Contrast: OMNIPAQUE IOHEXOL 350 MG/ML SOLN,  Comparison:   None.  CTA CHEST  Findings:  A relatively small pulmonary embolus is noted within pulmonary artery branches to the left lower  lobe.  This embolus is nonocclusive in nature.  Minimal bilateral atelectasis or scarring is noted, with a few areas of mild nonspecific haziness noted in the left lower lobe. This is nonspecific but could reflect a mild infectious process, or possibly atelectasis.  There is no evidence of pleural effusion or pneumothorax.  No masses are identified; no abnormal focal contrast enhancement is seen.  Scattered mediastinal nodes remain normal in size.  Scattered coronary artery calcifications are seen.  The esophageal wall appears mildly diffusely thickened, possibly reflecting mild esophagitis.  No pericardial effusion is identified.  The great vessels are grossly unremarkable in appearance.  No axillary lymphadenopathy is seen.  The visualized portions of the thyroid gland are unremarkable in appearance.  No acute osseous abnormalities are seen.   Review of the MIP images confirms the above findings.  IMPRESSION:  1.  Relatively small pulmonary embolus noted than pulmonary artery branches to the left lower lung lobe.  This embolus is nonocclusive in nature. 2.  Minimal bilateral atelectasis or scarring noted.  Few areas of mild nonspecific haziness noted in the left lower lobe.  This could reflect a mild infectious process, or possibly atelectasis. 3.  Mild diffuse esophageal wall thickening may reflect mild esophagitis.  4.  Scattered coronary artery calcifications seen.  CT ABDOMEN AND PELVIS  Findings: The liver and spleen are unremarkable in appearance.  The gallbladder is within normal limits.  The pancreas and adrenal glands are unremarkable.  Scattered calcifications at the renal hila appear to be vascular in nature.  Minimal nonspecific perinephric stranding is noted bilaterally.  The kidneys are otherwise unremarkable in appearance. There is no evidence of hydronephrosis.  No renal or ureteral stones are seen.  No free fluid is identified.  The small bowel is unremarkable in appearance.  The stomach is within  normal limits.  No acute vascular abnormalities are seen.  There is relatively diffuse calcification along the abdominal aorta and its branches, including along the proximal right renal artery. Extensive calcification along the common iliac arteries and their branches raises concern for significant luminal narrowing bilaterally.  The patient is status post appendectomy.  Contrast progresses to the level of the proximal sigmoid colon.  The sigmoid colon is redundant.  There appears to be mild wall thickening at the distal sigmoid colon, though this is partially obscured by a bolus of stool in the rectum.  Would consider sigmoidoscopy for further evaluation, when and as deemed clinically appropriate.  The bladder is moderately distended and grossly unremarkable in appearance.  The prostate remains normal in size, with scattered calcification.  No inguinal lymphadenopathy is seen.  No acute osseous abnormalities are identified.  Review of the MIP images confirms the above findings.  IMPRESSION:  1.  Apparent mild wall thickening at the distal sigmoid colon, partially obscured by a bolus of stool in the rectum.  Would consider sigmoidoscopy for further evaluation, when and as deemed clinically appropriate. 2.  Relatively diffuse calcification along the abdominal aorta and its branches, including along the proximal right renal artery. Extensive calcification along the common iliac arteries and their branches, raising concern for significant luminal narrowing bilaterally.  Critical Value/emergent results were called by telephone at the time of interpretation on 06/20/2012 at 01:06 a.m. to Dr. Midge Minium, who verbally acknowledged these results.   Original Report Authenticated By: Tonia Ghent, M.D.     PHYSICAL EXAM: Large ulcerative lesion involving the left anterior lateral tongue and extending across the floor of mouth and over the left mandibular ramus. Neck shows nontender adenopathy consistent with  possible metastatic lymph node.  CT scan of the neck:  Infiltrative mass involving the left anterior lateral tongue, extending superficial to deep and involving the left floor of mouth. Base of tongue and oropharynx shows no other lesion or mass. Cervical lymphadenopathy consistent with possible metastatic lesions involving the left submandibular and level II lymph nodes.   Procedure: Biopsy Left lateral tongue lesion Verbal consent for bedside biopsy was obtained, risks and benefits were discussed.  1/2 cc of 1% lidocaine 1 100,000 dilution epinephrine injected into the submucosal tissue along the anterior aspect of the left lateral tongue lesion. After allowing adequate time for vasoconstriction and anesthesia and biopsy performed cup forceps, 3 individual biopsies obtained from the transitional margin between tumor and normal appearing tissue. Biopsy sent to pathology for gross microscopic evaluation, minimal bleeding, no complications.     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 The patient is currently anticoagulated or small pulmonary embolism. Biopsies obtained today, expect result early next week. Patient may be discharged per medical  service and he will contact our office at [facility] for biopsy results and further planning. If biopsies are positive for invasive squamous cell carcinoma as clinically suspected and the patient required referral to [facility] Department for evaluation and definitive surgical treatment which would require wide local excision, mandibulectomy and neck dissection. Patient may also require postsurgical radiation therapy for best possible cure. The  above concerns and planning were discussed with the patient and his sister-in-law, they understand and agree with this plan.    Jason Strong 06/21/2012, 1:58 PM

## 2012-06-21 NOTE — Progress Notes (Signed)
ANTICOAGULATION CONSULT NOTE - follow up  Pharmacy Consult for heparin Indication: pulmonary embolus  No Known Allergies  Patient Measurements: Height: 5\' 7"  (170.2 cm) Weight: 121 lb 11.1 oz (55.2 kg) IBW/kg (Calculated) : 66.1 Heparin Dosing Weight: 55 kg  Vital Signs: Temp: 97.9 F (36.6 C) (05/29 0512) Temp src: Oral (05/29 0512) BP: 125/83 mmHg (05/29 0512) Pulse Rate: 80 (05/29 0512)  Labs:  Recent Labs  06/19/12 1803 06/19/12 1804 06/20/12 0505  06/20/12 1434 06/20/12 2255 06/21/12 0410  HGB 15.3  --  12.2*  --   --   --  11.7*  HCT 42.9  --  35.4*  --   --   --  34.0*  PLT 331  --  276  --   --   --  257  LABPROT  --  11.8  --   --   --   --   --   INR  --  0.87  --   --   --   --   --   HEPARINUNFRC  --   --   --   < > 0.22* 0.38 0.34  CREATININE 0.57  --  0.64  --   --   --   --   < > = values in this interval not displayed.  Estimated Creatinine Clearance: 85.3 ml/min (by C-G formula based on Cr of 0.64).   Assessment: 52 yo male who is on heparin per pharmacy for relatively small non-occlusive pulmonary embolus noted within the pulmonary artery branches to the left lower lung lobe. Due to h/o of intracerebral hemorrhage (01/2012), will not-bolus for now and will consider targeting lower-end of goal heparin range.   He has a large lesion on his tongue and plans for biopsy. Plan is to continue heparin until oral surgeon consult, then transition him to LMWH and coumadin for his PE. Heparin level (0.34) is at-goal on 1050 units/hr.   Goal of Therapy:  Heparin level 0.3 - 0.7 Monitor platelets by anticoagulation protocol: Yes   Plan:  1. Continue IV heparin at 1050 units/hr.  2. Daily CBC, heparin level.   Harland German, Pharm D 06/21/2012 8:54 AM

## 2012-06-21 NOTE — Progress Notes (Signed)
Pt reports Ativan has helped; will cont. To monitor.

## 2012-06-21 NOTE — Progress Notes (Signed)
Pt given 1mg PO Ativan at this time per pt request; will cont. To monitor. 

## 2012-06-22 DIAGNOSIS — I2699 Other pulmonary embolism without acute cor pulmonale: Secondary | ICD-10-CM

## 2012-06-22 DIAGNOSIS — I517 Cardiomegaly: Secondary | ICD-10-CM

## 2012-06-22 DIAGNOSIS — E43 Unspecified severe protein-calorie malnutrition: Secondary | ICD-10-CM | POA: Diagnosis present

## 2012-06-22 LAB — PROTIME-INR: INR: 1.2 (ref 0.00–1.49)

## 2012-06-22 MED ORDER — LEVOFLOXACIN 500 MG PO TABS: 500.0000 mg | ORAL_TABLET | Freq: Every day | ORAL | Status: DC

## 2012-06-22 MED ORDER — ENOXAPARIN SODIUM 80 MG/0.8ML ~~LOC~~ SOLN: 80.0000 mg | SUBCUTANEOUS | Status: DC

## 2012-06-22 MED ORDER — PANTOPRAZOLE SODIUM 40 MG PO TBEC
40.0000 mg | DELAYED_RELEASE_TABLET | Freq: Every day | ORAL | Status: DC
  Administered 2012-06-22: 40 mg via ORAL
  Filled 2012-06-22: qty 1

## 2012-06-22 MED ORDER — OXYCODONE-ACETAMINOPHEN 5-325 MG PO TABS: 1.0000 | ORAL_TABLET | ORAL | Status: DC | PRN

## 2012-06-22 MED ORDER — WARFARIN SODIUM 5 MG PO TABS: 5.0000 mg | ORAL_TABLET | Freq: Every day | ORAL | Status: DC

## 2012-06-22 MED ORDER — MAGIC MOUTHWASH W/LIDOCAINE: 5.0000 mL | Freq: Four times a day (QID) | ORAL | Status: DC | PRN

## 2012-06-22 MED ORDER — WARFARIN SODIUM 6 MG PO TABS
6.0000 mg | ORAL_TABLET | Freq: Once | ORAL | Status: AC
  Administered 2012-06-22: 6 mg via ORAL
  Filled 2012-06-22: qty 1

## 2012-06-22 MED ORDER — THIAMINE HCL 100 MG PO TABS: 100.0000 mg | ORAL_TABLET | Freq: Every day | ORAL | Status: DC

## 2012-06-22 MED ORDER — FOLIC ACID 1 MG PO TABS: 1.0000 mg | ORAL_TABLET | Freq: Every day | ORAL | Status: DC

## 2012-06-22 MED ORDER — BOOST / RESOURCE BREEZE PO LIQD: 1.0000 | Freq: Two times a day (BID) | ORAL | Status: DC

## 2012-06-22 MED ORDER — NICOTINE 21 MG/24HR TD PT24: 1.0000 | MEDICATED_PATCH | Freq: Every day | TRANSDERMAL | Status: DC

## 2012-06-22 MED ORDER — ADULT MULTIVITAMIN W/MINERALS CH: 1.0000 | ORAL_TABLET | Freq: Every day | ORAL | Status: DC

## 2012-06-22 NOTE — Discharge Summary (Signed)
Physician Discharge Summary  Jason Strong ZOX:[id number] DOB: 1960-03-28 DOA: 06/19/2012  PCP: Jason Baseman, MD  Admit date: 06/19/2012 Discharge date: 06/22/2012  Time spent: 35 minutes  Recommendations for Outpatient Follow-up:  1. Follow up with ENT on Monday.  2. Follow up with Pcp IN ONE WEEK.   Discharge Diagnoses:  Principal Problem:   Dehydration Active Problems:   HTN (hypertension)   Tobacco use disorder   Leucocytosis   Alcohol abuse   Acute pulmonary embolism   CAP (community acquired pneumonia)   Tongue lesion   Protein-calorie malnutrition, severe   Severe malnutrition   Discharge Condition: improved.   Diet recommendation: regular   Filed Weights   06/19/12 2324  Weight: 55.2 kg (121 lb 11.1 oz)    History of present illness:  Jason Strong is a 52 y.o. 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 Patient's labs were showing leukocytosis. Patient in the ER was found to be tachycardic with mildly elevated lactic acid levels. His labs done in the ER showed leukocytosis. Patient has not been eating well for last one month because of pain in the tongue due to ulcer. Patient has been having an ulcer notice for last few weeks and has been referred to oral surgeon. Patient has been not eating well for last few weeks. Denies any nausea vomiting chest pain productive cough fever chills abdominal pain diarrhea. Does complain of weight loss over the last few weeks. In the ER patient was on IV fluids. CT head was negative for any acute. CT angiogram of the chest was ordered because of mildly elevated d-dimer. It was positive for PE. He was started on IV heparin and ent consulted.     Hospital Course:  1. Dehydration - secondary to poor oral intake.he was started on IV fluids, and  He was placed on clear  liquids. Patient's poor oral intake is due to the tongue lesion. And advanced diet as he tolerated.  On discharge he was able to tolerate regular diet and IV fluids are stopped.   2. Leukocytosis - Resolved. patient is afebrile chest x-ray is unremarkable. UA is negative for infection.. CT angiogram of the chest and abdomen Showed a small PE in the left lower lobe and haziness consistent with infectious process. So he was started on IV heparin and IV antibiotics. His IV heparin transitioned to Lovenox inj and coumadin.  He was discharged on antibiotics to complete the course of the disease.   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 ENT consulted for biopsy. Done today and results pending. Recommend follow up with results on Monday with ENT.   4. Tachycardia and elevated d-dimer - CT angiogram of the chest shows small PE on IV heparin, later transitioned to coumadin.    5.. History of hypertension - due to poor oral intake at this time he was placed  on when necessary IV hydralazine for systolic blood pressure more than 140 given the history of cerebellar hemorrhage.  6. Alcohol and tobacco abuse - patient advised to quit this habit. Patient is placed on alcohol withdrawal protocol.  7. 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 Patient does not have any focal deficits or headache. Discussed the risk of cerebral bleeding being on coumadin , he admitted , that he understands the  risk of bleeding and wants to continue coumadin. Also discussed with him the need of closer monitoring being on coumadin.    Procedures:  BIOPSY of the lung lesion  Consultations:  ENT  Discharge Exam: Filed Vitals:   06/21/12 1959 06/21/12 2027 06/22/12 0448 06/22/12 1500  BP: 141/86 161/99 145/89 126/86  Pulse: 83 81 79 76  Temp: 98.8 F (37.1 C) 98.6 F (37 C) 98.4 F (36.9 C) 98.5 F (36.9 C)  TempSrc: Oral Oral Oral Oral   Resp: 18 18 20 18   Height:      Weight:      SpO2: 98% 100% 98% 99%   General: Well-developed and mal nourished.  Eyes: Anicteric no pallor.  ENT: There is a large lesion in the left border of the tongue.  Cardiovascular: S1-S2 heard.  Respiratory: No rhonchi or crepitations.  Abdomen: Soft nontender bowel sounds present  Musculoskeletal: No edema    Discharge Instructions  Discharge Orders   Future Appointments Provider Department Dept Phone   07/02/2012 1:00 PM [facility] Covering Provider [facility] [phone]   07/12/2012 11:20 AM Jason Ladd, MD [facility] [phone]   09/07/2012 3:40 PM Jason Ladd, MD [facility] [phone]   Future Orders Complete By Expires     Discharge instructions  As directed     Comments:      Follow up with [facility] ENT as recommended.  Follow up the biopsy report on Monday Check INR tomorrow and Monday and relay the results to PCP.    Increase activity slowly  As directed         Medication List    TAKE these medications       enoxaparin 80 MG/0.8ML injection  Commonly known as:  LOVENOX  Inject 0.8 mLs (80 mg total) into the skin daily.     feeding supplement Liqd  Take 1 Container by mouth 2 (two) times daily between meals.     folic acid 1 MG tablet  Commonly known as:  FOLVITE  Take 1 tablet (1 mg total) by mouth daily.     levofloxacin 500 MG tablet  Commonly known as:  LEVAQUIN  Take 1 tablet (500 mg total) by mouth daily.  Start taking on:  06/23/2012     lisinopril 20 MG tablet  Commonly known as:  PRINIVIL,ZESTRIL  Take 1 tablet (20 mg total) by mouth daily.     magic mouthwash w/lidocaine Soln  Take 5 mLs by mouth 4 (four) times daily as needed.     multivitamin with minerals Tabs  Take 1 tablet by mouth daily.     nicotine 21 mg/24hr patch  Commonly known as:  NICODERM CQ - dosed in mg/24 hours  Place 1 patch onto the skin daily.      omeprazole 20 MG capsule  Commonly known as:  PRILOSEC  Take 20 mg by mouth daily as needed. For heart burn     oxyCODONE-acetaminophen 5-325 MG per tablet  Commonly known as:  PERCOCET/ROXICET  Take 1 tablet by mouth every 4 (four) hours as needed.     thiamine 100 MG tablet  Take 1 tablet (100 mg total) by mouth daily.     VITAMIN D-3 PO  Take 1 capsule by mouth daily.     warfarin 5 MG tablet  Commonly known as:  COUMADIN  Take 1 tablet (5 mg total) by mouth daily.       No Known Allergies  Follow-up Information   Follow up with Jason Baseman, MD. Schedule an appointment as soon as possible for a visit in 1 week.   Contact information:   [address] [place] [address] [phone]       Follow up with [facility] Call on 06/25/2012. (FOR BIOPSY RESULTS. AND MAKE APPOINTMENT. )    Contact information:   [address] [place] [address] [phone]      Follow up with Osborn Coho, MD. Call on 06/25/2012. (for biopsy results/ appoint ment and follow up. )    Contact information:   [address], [address] [place] [address] [phone]        The results of significant diagnostics from this hospitalization (including imaging, microbiology, ancillary and laboratory) are listed below for reference.    Significant Diagnostic Studies: Dg Chest 2 View  06/19/2012   *RADIOLOGY REPORT*  Clinical Data: Weakness.  CHEST - 2 VIEW  Comparison: Chest x-ray 02/21/2012.  Findings: Lung volumes are normal.  No consolidative airspace disease.  No pleural effusions.  No pneumothorax.  No pulmonary nodule or mass noted. A small area of linear scarring in the right midlung is unchanged.  Pulmonary vasculature and the cardiomediastinal silhouette are within normal limits. Atherosclerosis in the thoracic aorta.  IMPRESSION: 1. No radiographic evidence of acute cardiopulmonary  disease. 2.  Atherosclerosis.   Original Report Authenticated By: Trudie Reed, M.D.   Ct Head Wo Contrast  06/19/2012   *RADIOLOGY REPORT*  Clinical Data: History of intracranial hemorrhage.  CT HEAD WITHOUT CONTRAST  Technique:  Contiguous axial images were obtained from the base of the skull through the vertex without contrast.  Comparison: Head CT 02/22/2012.  Findings: Well defined area of low attenuation in the medial aspect of the left cerebellar hemisphere is compatible with encephalomalacia at the site of prior hemorrhage.  Postoperative changes of suboccipital craniectomy are noted.  No acute intracranial abnormality.  Specifically, no signs of acute intracranial hemorrhage, no mass, mass effect, hydrocephalus or abnormal intra or extra-axial fluid collections.  No definite evidence of acute/subacute cerebral ischemia.  Visualized paranasal sinuses and mastoids are well pneumatized.  IMPRESSION: 1.  Postoperative changes of suboccipital craniectomy for drainage of prior cerebellar vermian hematoma with a small area of encephalomalacia in the medial left cerebellar hemisphere.  No evidence of recurrent hemorrhage or other acute intracranial abnormalities.   Original Report Authenticated By: Trudie Reed, M.D.   Ct Soft Tissue Neck W Contrast  06/21/2012   *RADIOLOGY REPORT*  Clinical Data: Left tongue mass.  CT NECK WITH CONTRAST  Technique:  Multidetector CT imaging of the neck was performed with intravenous contrast.  Contrast: 75mL OMNIPAQUE IOHEXOL 300 MG/ML  SOLN  Comparison: None.  Findings: There is a mass involving the left lateral tongue.  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  The   measures approximately 15 x 21 x 30 mm.  Base of the tongue is intact.  Left submandibular node measures 17 mm and has mixed density.  This is highly suspicious for metastatic spread.  Submental node measures 7 mm and could  also be due to early tumor spread.  9 x 15 mm lymph node lateral to the left submandibular gland has a fatty hilum and is probably benign. However given this close proximity to the malignant node, tumor not excluded.  No pathologic lymph nodes are present on the right.  No other enlarged lymph nodes are present on the left.  Submandibular and parotid glands are normal bilaterally.  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  The larynx appears normal.  Thyroid appears normal.  Lung apices are clear.  Congenital fusion of C2 and C3.  There is spondylosis at C3-4.  No acute bony change.  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  Pathologic submandibular lymph node measuring 17 mm. Additional small submandibular and submental nodes on the left indeterminate for malignant extension.  Suboccipital craniectomy with a small pseudomeningocele.   Original Report Authenticated By: Janeece Riggers, M.D.   Ct Angio Chest Pe W/cm &/or Wo Cm  06/20/2012   *RADIOLOGY REPORT*  Clinical Data:  Leukocytosis; tachycardia.  Concern for pulmonary embolus.  Anorexia.  CT ANGIOGRAPHY CHEST CT ABDOMEN AND PELVIS WITH CONTRAST  Technique:  Multidetector CT imaging of the chest was performed using the standard protocol during bolus administration of intravenous contrast.  Multiplanar CT image reconstructions including MIPs were obtained to evaluate the vascular anatomy. Multidetector CT imaging of the abdomen and pelvis was performed using the standard protocol during bolus administration of intravenous contrast.  Contrast: OMNIPAQUE IOHEXOL 350 MG/ML SOLN,  Comparison:   None.  CTA CHEST  Findings:  A relatively small pulmonary embolus is noted within pulmonary artery branches to the left lower lobe.  This embolus is nonocclusive in nature.  Minimal bilateral atelectasis or scarring is noted, with a few areas of mild  nonspecific haziness noted in the left lower lobe. This is nonspecific but could reflect a mild infectious process, or possibly atelectasis.  There is no evidence of pleural effusion or pneumothorax.  No masses are identified; no abnormal focal contrast enhancement is seen.  Scattered mediastinal nodes remain normal in size.  Scattered coronary artery calcifications are seen.  The esophageal wall appears mildly diffusely thickened, possibly reflecting mild esophagitis.  No pericardial effusion is identified.  The great vessels are grossly unremarkable in appearance.  No axillary lymphadenopathy is seen.  The visualized portions of the thyroid gland are unremarkable in appearance.  No acute osseous abnormalities are seen.   Review of the MIP images confirms the above findings.  IMPRESSION:  1.  Relatively small pulmonary embolus noted than pulmonary artery branches to the left lower lung lobe.  This embolus is nonocclusive in nature. 2.  Minimal bilateral atelectasis or scarring noted.  Few areas of mild nonspecific haziness noted in the left lower lobe.  This could reflect a mild infectious process, or possibly atelectasis. 3.  Mild diffuse esophageal wall thickening may reflect mild esophagitis.  4.  Scattered coronary artery calcifications seen.  CT ABDOMEN AND PELVIS  Findings: The liver and spleen are unremarkable in appearance.  The gallbladder is within normal limits.  The pancreas and adrenal glands are unremarkable.  Scattered calcifications at the renal hila appear to be vascular in nature.  Minimal nonspecific perinephric stranding is noted bilaterally.  The kidneys are otherwise unremarkable in appearance. There is no evidence of hydronephrosis.  No renal or ureteral stones are seen.  No free fluid is identified.  The small bowel is unremarkable in appearance.  The stomach is within normal limits.  No acute vascular abnormalities are seen.  There is relatively diffuse calcification along the abdominal  aorta and its branches, including along the proximal right renal artery. Extensive calcification along  the common iliac arteries and their branches raises concern for significant luminal narrowing bilaterally.  The patient is status post appendectomy.  Contrast progresses to the level of the proximal sigmoid colon.  The sigmoid colon is redundant.  There appears to be mild wall thickening at the distal sigmoid colon, though this is partially obscured by a bolus of stool in the rectum.  Would consider sigmoidoscopy for further evaluation, when and as deemed clinically appropriate.  The bladder is moderately distended and grossly unremarkable in appearance.  The prostate remains normal in size, with scattered calcification.  No inguinal lymphadenopathy is seen.  No acute osseous abnormalities are identified.  Review of the MIP images confirms the above findings.  IMPRESSION:  1.  Apparent mild wall thickening at the distal sigmoid colon, partially obscured by a bolus of stool in the rectum.  Would consider sigmoidoscopy for further evaluation, when and as deemed clinically appropriate. 2.  Relatively diffuse calcification along the abdominal aorta and its branches, including along the proximal right renal artery. Extensive calcification along the common iliac arteries and their branches, raising concern for significant luminal narrowing bilaterally.  Critical Value/emergent results were called by telephone at the time of interpretation on 06/20/2012 at 01:06 a.m. to Dr. Midge Minium, who verbally acknowledged these results.   Original Report Authenticated By: Tonia Ghent, M.D.   Ct Abdomen Pelvis W Contrast  06/20/2012   *RADIOLOGY REPORT*  Clinical Data:  Leukocytosis; tachycardia.  Concern for pulmonary embolus.  Anorexia.  CT ANGIOGRAPHY CHEST CT ABDOMEN AND PELVIS WITH CONTRAST  Technique:  Multidetector CT imaging of the chest was performed using the standard protocol during bolus administration of  intravenous contrast.  Multiplanar CT image reconstructions including MIPs were obtained to evaluate the vascular anatomy. Multidetector CT imaging of the abdomen and pelvis was performed using the standard protocol during bolus administration of intravenous contrast.  Contrast: OMNIPAQUE IOHEXOL 350 MG/ML SOLN,  Comparison:   None.  CTA CHEST  Findings:  A relatively small pulmonary embolus is noted within pulmonary artery branches to the left lower lobe.  This embolus is nonocclusive in nature.  Minimal bilateral atelectasis or scarring is noted, with a few areas of mild nonspecific haziness noted in the left lower lobe. This is nonspecific but could reflect a mild infectious process, or possibly atelectasis.  There is no evidence of pleural effusion or pneumothorax.  No masses are identified; no abnormal focal contrast enhancement is seen.  Scattered mediastinal nodes remain normal in size.  Scattered coronary artery calcifications are seen.  The esophageal wall appears mildly diffusely thickened, possibly reflecting mild esophagitis.  No pericardial effusion is identified.  The great vessels are grossly unremarkable in appearance.  No axillary lymphadenopathy is seen.  The visualized portions of the thyroid gland are unremarkable in appearance.  No acute osseous abnormalities are seen.   Review of the MIP images confirms the above findings.  IMPRESSION:  1.  Relatively small pulmonary embolus noted than pulmonary artery branches to the left lower lung lobe.  This embolus is nonocclusive in nature. 2.  Minimal bilateral atelectasis or scarring noted.  Few areas of mild nonspecific haziness noted in the left lower lobe.  This could reflect a mild infectious process, or possibly atelectasis. 3.  Mild diffuse esophageal wall thickening may reflect mild esophagitis.  4.  Scattered coronary artery calcifications seen.  CT ABDOMEN AND PELVIS  Findings: The liver and spleen are unremarkable in appearance.  The  gallbladder is within normal limits.  The pancreas and adrenal glands are unremarkable.  Scattered calcifications at the renal hila appear to be vascular in nature.  Minimal nonspecific perinephric stranding is noted bilaterally.  The kidneys are otherwise unremarkable in appearance. There is no evidence of hydronephrosis.  No renal or ureteral stones are seen.  No free fluid is identified.  The small bowel is unremarkable in appearance.  The stomach is within normal limits.  No acute vascular abnormalities are seen.  There is relatively diffuse calcification along the abdominal aorta and its branches, including along the proximal right renal artery. Extensive calcification along the common iliac arteries and their branches raises concern for significant luminal narrowing bilaterally.  The patient is status post appendectomy.  Contrast progresses to the level of the proximal sigmoid colon.  The sigmoid colon is redundant.  There appears to be mild wall thickening at the distal sigmoid colon, though this is partially obscured by a bolus of stool in the rectum.  Would consider sigmoidoscopy for further evaluation, when and as deemed clinically appropriate.  The bladder is moderately distended and grossly unremarkable in appearance.  The prostate remains normal in size, with scattered calcification.  No inguinal lymphadenopathy is seen.  No acute osseous abnormalities are identified.  Review of the MIP images confirms the above findings.  IMPRESSION:  1.  Apparent mild wall thickening at the distal sigmoid colon, partially obscured by a bolus of stool in the rectum.  Would consider sigmoidoscopy for further evaluation, when and as deemed clinically appropriate. 2.  Relatively diffuse calcification along the abdominal aorta and its branches, including along the proximal right renal artery. Extensive calcification along the common iliac arteries and their branches, raising concern for significant luminal narrowing  bilaterally.  Critical Value/emergent results were called by telephone at the time of interpretation on 06/20/2012 at 01:06 a.m. to Dr. Midge Minium, who verbally acknowledged these results.   Original Report Authenticated By: Tonia Ghent, M.D.    Microbiology: No results found for this or any previous visit (from the past 240 hour(s)).   Labs: Basic Metabolic Panel:  Recent Labs Lab 06/19/12 1803 06/20/12 0505  NA 134* 137  K 3.8 3.6  CL 96 103  CO2 22 24  GLUCOSE 139* 155*  BUN 5* 4*  CREATININE 0.57 0.64  CALCIUM 10.0 9.1   Liver Function Tests:  Recent Labs Lab 06/19/12 1803 06/20/12 0505  AST 23 17  ALT 15 11  ALKPHOS 112 81  BILITOT 0.6 0.3  PROT 7.6 5.8*  ALBUMIN 3.3* 2.5*   No results found for this basename: LIPASE, AMYLASE,  in the last 168 hours No results found for this basename: AMMONIA,  in the last 168 hours CBC:  Recent Labs Lab 06/19/12 1803 06/20/12 0505 06/21/12 0410  WBC 15.5* 10.1 8.9  NEUTROABS  --  5.6  --   HGB 15.3 12.2* 11.7*  HCT 42.9 35.4* 34.0*  MCV 90.3 91.9 91.9  PLT 331 276 257   Cardiac Enzymes: No results found for this basename: CKTOTAL, CKMB, CKMBINDEX, TROPONINI,  in the last 168 hours BNP: BNP (last 3 results) No results found for this basename: PROBNP,  in the last 8760 hours CBG:  Recent Labs Lab 06/20/12 0600 06/20/12 1120 06/21/12 1152 06/21/12 2349 06/22/12 0600  GLUCAP 121* 130* 148* 115* 105*       Signed:  Tamika Shropshire  [facility] 06/22/2012, 3:53 PM

## 2012-06-22 NOTE — Progress Notes (Signed)
Physical Therapy Evaluation and D/C Patient Details Name: Jason Strong MRN: [id number] DOB: 09/19/1960 Today's Date: 06/22/2012 Time: 6578-4696 PT Time Calculation (min): 18 min  PT Assessment / Plan / Recommendation Clinical Impression  Pt s/p PE and dehydration with decr mobility secondary to decr endurance and decr balance.  Overall pt ambulates well with RW with stand by to min guard assist.  Pt was receiving Outpt PT PTA.  Recommend that pt continue Outpt PT f/u at d/c.  Has 24 hour care and equipment.  Needs no further PT in hospital as pt feels he is at baseline and will be going home this pm.  Sign off.     PT Assessment  All further PT needs can be met in the next venue of care    Follow Up Recommendations  Outpatient PT;Supervision/Assistance - 24 hour                Equipment Recommendations  None recommended by PT               Precautions / Restrictions Precautions Precautions: Fall Restrictions Weight Bearing Restrictions: No   Pertinent Vitals/Pain HR 106-130 bpm, No pain      Mobility  Bed Mobility Bed Mobility: Rolling Left;Left Sidelying to Sit Rolling Left: 5: Supervision Left Sidelying to Sit: 5: Supervision Transfers Transfers: Sit to Stand;Stand to Sit Sit to Stand: 4: Min guard;4: Min assist;With upper extremity assist;From bed Stand to Sit: 5: Supervision;With upper extremity assist;To bed Details for Transfer Assistance: Initially pt leaning to his right and PT assisted to achieve more balanced standing.  Pt obtained balance and maintained with ambulation with incr weight shift entire time to right but pt was not losing balance entire time.   Ambulation/Gait Ambulation/Gait Assistance: 4: Min guard Ambulation Distance (Feet): 145 Feet Assistive device: Rolling walker Ambulation/Gait Assistance Details: Pt ambulated with RW with right lateral lean but maintains balance and did not lose balance at all.  Pt with decr weight shift to left.  Pt  with ataxic gait at times however pt does not lose balance.   Gait Pattern: Step-through pattern;Decreased step length - left;Decreased stance time - left;Decreased stride length;Decreased hip/knee flexion - left;Decreased dorsiflexion - left;Decreased weight shift to left;Ataxic;Lateral trunk lean to right;Trunk rotated posteriorly on right;Trunk flexed Gait velocity: decreased Stairs: No Wheelchair Mobility Wheelchair Mobility: No         PT Diagnosis: Generalized weakness  PT Problem List: Decreased activity tolerance;Decreased balance;Decreased mobility;Decreased knowledge of use of DME;Decreased safety awareness;Decreased knowledge of precautions PT Treatment Interventions:     PT Goals    Visit Information  Last PT Received On: 06/22/12 Assistance Needed: +1    Subjective Data  Subjective: "I feel I am close to baseline." Patient Stated Goal: to go home   Prior Functioning  Home Living Lives With: Other (Comment) (brother and sister in law) Available Help at Discharge: Family;Available 24 hours/day Type of Home: House Home Access: Stairs to enter [employer] of Steps: 5 Entrance Stairs-Rails: Can reach both Home Layout: One level Bathroom Shower/Tub: Engineer, manufacturing systems: Standard Home Adaptive Equipment: Environmental consultant - rolling;Walker - four wheeled;Shower chair with back Prior Function Level of Independence: Independent with assistive device(s);Needs assistance Needs Assistance: Bathing;Meal Prep;Light Housekeeping;Gait Bath: Minimal (assist to get into shower) Meal Prep: Total Light Housekeeping: Total Gait Assistance: Brother or sister in law guard pt when walking with RW Able to Take Stairs?: Yes Driving: No Vocation:  (seeking disability) Communication Communication: No difficulties  Cognition  Cognition Arousal/Alertness: Awake/alert Behavior During Therapy: WFL for tasks assessed/performed Overall Cognitive Status: Within Functional  Limits for tasks assessed (slightly impulsive at times but appears safe in hospital )    Extremity/Trunk Assessment Right Lower Extremity Assessment RLE ROM/Strength/Tone: [facility] for tasks assessed Left Lower Extremity Assessment LLE ROM/Strength/Tone: Deficits LLE ROM/Strength/Tone Deficits: grossly 3/5 LLE Coordination: Deficits LLE Coordination Deficits: ataxia noted Trunk Assessment Trunk Assessment: Other exceptions (trunk weak on left with right lateral lean)   Balance Static Standing Balance Static Standing - Balance Support: Bilateral upper extremity supported;During functional activity Static Standing - Level of Assistance: 5: Stand by assistance Static Standing - Comment/# of Minutes: 2 minutes with RW High Level Balance High Level Balance Activites: Direction changes;Turns;Sudden stops;Head turns High Level Balance Comments: No problems with balance with min challenges in controlled environment.    End of Session PT - End of Session Equipment Utilized During Treatment: Gait belt Activity Tolerance: Patient limited by fatigue Patient left: in bed;with call bell/phone within reach;with bed alarm set Nurse Communication: Mobility status       INGOLD,Maddeline Roorda 06/22/2012, 4:09 PM [facility] [phone] (pager)

## 2012-06-22 NOTE — Progress Notes (Signed)
  Echocardiogram 2D Echocardiogram has been performed.  Jason Strong 06/22/2012, 11:24 AM

## 2012-06-22 NOTE — Progress Notes (Signed)
ANTICOAGULATION CONSULT NOTE - follow up  Pharmacy Consult for Lovenox --> Coumadin Indication: pulmonary embolus  No Known Allergies  Patient Measurements: Height: 5\' 7"  (170.2 cm) Weight: 121 lb 11.1 oz (55.2 kg) IBW/kg (Calculated) : 66.1  Vital Signs: Temp: 98.4 F (36.9 C) (05/30 0448) Temp src: Oral (05/30 0448) BP: 145/89 mmHg (05/30 0448) Pulse Rate: 79 (05/30 0448)  Labs:  Recent Labs  06/19/12 1803 06/19/12 1804 06/20/12 0505  06/20/12 1434 06/20/12 2255 06/21/12 0410 06/22/12 0525  HGB 15.3  --  12.2*  --   --   --  11.7*  --   HCT 42.9  --  35.4*  --   --   --  34.0*  --   PLT 331  --  276  --   --   --  257  --   LABPROT  --  11.8  --   --   --   --   --  15.0  INR  --  0.87  --   --   --   --   --  1.20  HEPARINUNFRC  --   --   --   < > 0.22* 0.38 0.34  --   CREATININE 0.57  --  0.64  --   --   --   --   --   < > = values in this interval not displayed.  Estimated Creatinine Clearance: 85.3 ml/min (by C-G formula based on Cr of 0.64).  Assessment: 52 yo male on Lovenox to Coumadin bridge for a relatively small non-occlusive pulmonary embolus noted within the pulmonary artery branches to the left lower lung lobe. 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 He is s/p biopsy of left lateral tongue lesion yesterday with minimal bleeding. INR is subtherapeutic after first dose but appears to be trending up already. No bleeding noted, H/H trending down since admit - will watch, pltc wnl.  Today is day 2 of Lovenox to Coumadin bridge. Bridge to continue a minimum of 5 days AND until INR >= 2 for 24 hours.  Goal of Therapy:  INR 2-3 Monitor platelets by anticoagulation protocol: Yes   Plan:  -Lovenox 80 mg SQ q24h (1.5 mg/kg/day) -Coumadin 6 mg PO tonight -Coumadin book and video for education previously ordered -Daily INR -Upon discharge, would recommend Coumadin 5 mg daily with an appt to check INR in the next few days  [facility], [place].D.,  BCPS Clinical Pharmacist Pager: [phone] 06/22/2012 9:28 AM

## 2012-06-22 NOTE — Progress Notes (Signed)
Pt to d/c home; sister in law to administer lovenox injections; IV and tele monitor d/c at this time; Lovenox and coumadin to be administered prior to d/c per MD order.

## 2012-06-22 NOTE — Progress Notes (Signed)
Pt given 1mg  Po Ativan and magic mouthwash per pt request at this time; will cont. To monitor.

## 2012-06-22 NOTE — Progress Notes (Signed)
VASCULAR LAB PRELIMINARY  PRELIMINARY  PRELIMINARY  PRELIMINARY  VASCULAR LAB PRELIMINARY  PRELIMINARY  PRELIMINARY  PRELIMINARY  Bilateral lower extremity venous duplex completed.    Preliminary report: Bilateral:  No evidence of DVT, superficial thrombosis, or Baker's Cyst.   Jason Strong, RVS 06/22/2012, 11:01 AM

## 2012-06-22 NOTE — Progress Notes (Signed)
Pt anxiously awaiting d/c home; MD paged; will await callback.

## 2012-06-25 ENCOUNTER — Ambulatory Visit: Payer: Self-pay | Admitting: Pharmacist

## 2012-06-25 ENCOUNTER — Telehealth: Payer: Self-pay | Admitting: *Deleted

## 2012-06-25 DIAGNOSIS — I2699 Other pulmonary embolism without acute cor pulmonale: Secondary | ICD-10-CM

## 2012-06-25 NOTE — Telephone Encounter (Signed)
Recommended hold warfarin for 2 days. Venipuncture INR and CBC drawn by home health nurse No alcohol.   Recheck INR ordered for Wednesday 6-4.

## 2012-06-25 NOTE — Telephone Encounter (Signed)
PT 93.6, INR 7.8. Takes 5mg  qd. Pt drank a 12 oz. Beer last night

## 2012-06-27 ENCOUNTER — Telehealth: Payer: Self-pay | Admitting: *Deleted

## 2012-06-27 ENCOUNTER — Ambulatory Visit: Payer: Self-pay | Admitting: Pharmacist

## 2012-06-27 DIAGNOSIS — I2699 Other pulmonary embolism without acute cor pulmonale: Secondary | ICD-10-CM

## 2012-06-27 NOTE — Telephone Encounter (Signed)
Continue to hold warfarin today - 6/4 and tomorrow 6/5.  Recheck protime/INR Friday.  Instruction given to Mr. Woody's brother Dorinda Hill Also called Byrd Hesselbach with [facility] - instructions and repeat protime orders given.

## 2012-06-27 NOTE — Telephone Encounter (Signed)
INR 4.0, has held x 2 days

## 2012-06-29 ENCOUNTER — Telehealth: Payer: Self-pay | Admitting: *Deleted

## 2012-06-29 NOTE — Telephone Encounter (Signed)
Maria from [facility] checked PT/INR today and its 1.3. Patient has been off of coumadin all week due to high result on Monday. He has 5mg  tabs at home. Please advise and return call to [facility] at [phone].

## 2012-06-29 NOTE — Telephone Encounter (Signed)
Restart at 5 mg daily and recheck protime on Monday

## 2012-06-29 NOTE — Telephone Encounter (Signed)
Spoke with Byrd Hesselbach [phone]. @[facility]  Instructions below given and she will call pt and repeat PT on Monday with a call report

## 2012-07-01 DIAGNOSIS — C021 Malignant neoplasm of border of tongue: Secondary | ICD-10-CM | POA: Insufficient documentation

## 2012-07-02 ENCOUNTER — Ambulatory Visit (HOSPITAL_COMMUNITY): Payer: Medicaid Other

## 2012-07-02 ENCOUNTER — Telehealth: Payer: Self-pay

## 2012-07-02 NOTE — Telephone Encounter (Signed)
Adjust  dose of Coumadin to 2.5 mg daily and recheck Protime in 3 days. Thanks. Latriece Anstine P. Modesto Charon, M.D.

## 2012-07-02 NOTE — Telephone Encounter (Signed)
Jason Strong from [facility] [phone] called to report INR 2.3 , PT 27.9  Current dose is 5mg  qd June 6 last Friday pt was 1.3   Please advise

## 2012-07-02 NOTE — Telephone Encounter (Signed)
Jason Strong notifed and she will call wed with Protime results

## 2012-07-03 ENCOUNTER — Encounter (HOSPITAL_COMMUNITY): Payer: Self-pay

## 2012-07-04 ENCOUNTER — Other Ambulatory Visit: Payer: Self-pay | Admitting: Family Medicine

## 2012-07-04 ENCOUNTER — Telehealth: Payer: Self-pay

## 2012-07-04 MED ORDER — WARFARIN SODIUM 2 MG PO TABS: 2.0000 mg | ORAL_TABLET | Freq: Every day | ORAL | Status: DC

## 2012-07-04 NOTE — Telephone Encounter (Signed)
[facility] from [facility] notified and new rx for warfarin called to [facility] [place]

## 2012-07-04 NOTE — Telephone Encounter (Signed)
Jason Strong called to report INR 3.6  Pt has 5mg  tablets and is taking 2.5 mg daily Please advise  call back [phone]

## 2012-07-04 NOTE — Telephone Encounter (Signed)
Reduce coumadin to 2 mg daily. Repeat Protime in 2 days. Herminio Kniskern P. Modesto Charon, M.D.

## 2012-07-06 ENCOUNTER — Ambulatory Visit: Payer: Medicaid Other | Admitting: Family Medicine

## 2012-07-06 ENCOUNTER — Telehealth: Payer: Self-pay | Admitting: *Deleted

## 2012-07-06 NOTE — Telephone Encounter (Signed)
Almira Coaster done

## 2012-07-06 NOTE — Telephone Encounter (Signed)
continue current dose recheck in 2 weeks

## 2012-07-06 NOTE — Telephone Encounter (Signed)
INR was 2.9 today, takes 2mg  qd. Have nurse call Byrd Hesselbach at [phone] with orders for next protime

## 2012-07-09 ENCOUNTER — Other Ambulatory Visit: Payer: Self-pay | Admitting: *Deleted

## 2012-07-09 MED ORDER — LISINOPRIL 20 MG PO TABS: 20.0000 mg | ORAL_TABLET | Freq: Every day | ORAL | Status: DC

## 2012-07-09 NOTE — Telephone Encounter (Signed)
Done 07/09/12

## 2012-07-11 ENCOUNTER — Telehealth: Payer: Self-pay | Admitting: Family Medicine

## 2012-07-11 DIAGNOSIS — I2699 Other pulmonary embolism without acute cor pulmonale: Secondary | ICD-10-CM | POA: Insufficient documentation

## 2012-07-12 ENCOUNTER — Ambulatory Visit: Payer: Medicaid Other | Admitting: Family Medicine

## 2012-07-12 NOTE — Telephone Encounter (Signed)
Left message to call for appt at her convince

## 2012-07-19 ENCOUNTER — Ambulatory Visit (INDEPENDENT_AMBULATORY_CARE_PROVIDER_SITE_OTHER): Payer: Medicaid Other | Admitting: Pharmacist

## 2012-07-19 ENCOUNTER — Telehealth: Payer: Self-pay | Admitting: *Deleted

## 2012-07-19 DIAGNOSIS — I2699 Other pulmonary embolism without acute cor pulmonale: Secondary | ICD-10-CM

## 2012-07-19 LAB — POCT INR: INR: 2.3

## 2012-07-19 NOTE — Telephone Encounter (Signed)
INR 2.3, takes 2 mg daily. Having surgery 07/25/12

## 2012-07-19 NOTE — Telephone Encounter (Signed)
Discussed continuing warfarin 2mg  daily with [facility] nurse and Lynetta Mare.  Patient to have surgery next week and surgeon to give instructions on holding warfarin and starting lovenox.

## 2012-07-23 DIAGNOSIS — K1370 Unspecified lesions of oral mucosa: Secondary | ICD-10-CM

## 2012-07-23 DIAGNOSIS — J189 Pneumonia, unspecified organism: Secondary | ICD-10-CM

## 2012-07-23 DIAGNOSIS — I2699 Other pulmonary embolism without acute cor pulmonale: Secondary | ICD-10-CM

## 2012-07-26 ENCOUNTER — Telehealth: Payer: Self-pay | Admitting: *Deleted

## 2012-07-26 NOTE — Telephone Encounter (Signed)
[facility] from [facility] called and wants to know when next protime should be done, his surgery has moved back to 08/08/12. He has MCD and she is trying to space out visits

## 2012-07-30 NOTE — Telephone Encounter (Signed)
Should recheck protime any day the week of 7/7 through 7/11.   Jason Strong with order.

## 2012-07-31 ENCOUNTER — Telehealth: Payer: Self-pay | Admitting: *Deleted

## 2012-07-31 NOTE — Telephone Encounter (Signed)
INR 4.9 Takes 2 mg daily, she is going to do venipuncture and bring into office, see tammys last notes, call Byrd Hesselbach at [phone]

## 2012-07-31 NOTE — Telephone Encounter (Signed)
Called Byrd Hesselbach and instructed her to tell patient to hold coumadin today and tomorrow and restart on Thursday alternating 1mg /2mg  since he is having surgery on 7/16.  I don't have information on what day he will begin to hold his warfarin prior to surgery.  She will check his INR on 7/14 and if the VP result from today is any different I will call her with new directions.  His last INR was 2.1 and nothing has changed with his medications or diet.  He was taking 2mg  qd instead of 1mg  on Mondays, however that is the only change.  He is not experiencing any signs or symptoms of bleeding.

## 2012-08-08 HISTORY — PX: FREE FLAP RADIAL FOREARM: SHX1678

## 2012-08-08 HISTORY — PX: MANDIBULECTOMY: SHX1999

## 2012-08-08 HISTORY — PX: SELECTIVE NECK DISSECTION: SHX2391

## 2012-08-08 HISTORY — PX: HEMIGLOSSECTOMY: SHX1740

## 2012-08-08 HISTORY — PX: TRACHEOSTOMY: SUR1362

## 2012-08-08 HISTORY — PX: SKIN GRAFT SPLIT THICKNESS LEG / FOOT: SUR1303

## 2012-08-11 DIAGNOSIS — F1020 Alcohol dependence, uncomplicated: Secondary | ICD-10-CM | POA: Insufficient documentation

## 2012-09-03 HISTORY — PX: GASTROSTOMY TUBE PLACEMENT: SHX655

## 2012-09-07 ENCOUNTER — Ambulatory Visit: Payer: Medicaid Other | Admitting: Family Medicine

## 2012-09-19 DIAGNOSIS — D638 Anemia in other chronic diseases classified elsewhere: Secondary | ICD-10-CM | POA: Insufficient documentation

## 2012-09-19 DIAGNOSIS — R636 Underweight: Secondary | ICD-10-CM | POA: Insufficient documentation

## 2012-10-17 ENCOUNTER — Telehealth: Payer: Self-pay | Admitting: Family Medicine

## 2012-10-18 ENCOUNTER — Telehealth: Payer: Self-pay

## 2012-10-18 NOTE — Telephone Encounter (Signed)
Folic acid 1 mg is can get OTC.  Not seen.  Needs to speak to the specialists that he sees. No record of hospitalization after May. We have not seen him for a while.  Needs to be seen.

## 2012-10-18 NOTE — Telephone Encounter (Signed)
When he was released from the hospital they did not give him a prescription for Klonopin, Folic Acid 1 mg or Hydrocodone   He needs them  [facility] in [place]

## 2012-10-19 ENCOUNTER — Other Ambulatory Visit: Payer: Self-pay

## 2012-10-19 DIAGNOSIS — J189 Pneumonia, unspecified organism: Secondary | ICD-10-CM

## 2012-10-19 MED ORDER — FOLIC ACID 1 MG PO TABS: 1.0000 mg | ORAL_TABLET | Freq: Every day | ORAL | Status: DC

## 2012-10-19 MED ORDER — CLONAZEPAM 0.5 MG PO TABS: 0.5000 mg | ORAL_TABLET | Freq: Two times a day (BID) | ORAL | Status: DC | PRN

## 2012-10-19 MED ORDER — OXYCODONE-ACETAMINOPHEN 5-325 MG PO TABS: 1.0000 | ORAL_TABLET | ORAL | Status: DC | PRN

## 2012-10-19 NOTE — Telephone Encounter (Signed)
Last seen  06/07/12 FPW  Calling in because discharged from hospital and did not receive these meds  They are also wanting Klonopin but it is not on EPIC list so I have no idea what mg. Or directions If approved print  And route to nurse

## 2012-10-19 NOTE — Telephone Encounter (Signed)
Patient was discharged from [facility].  Records were faxed over. Patient has a trach and PEG tube and was hospitalized for pneumonia and sepsis.  He was given nebulizer medications but no script for a nebulizer.   Also needs hydrocodone and klonopin.  Family aware that they will need to pickup scripts.  Patient has been seen by Dr. Modesto Charon but he is out of the office this evening and this wasn't addressed earlier.

## 2012-10-22 NOTE — Telephone Encounter (Signed)
This was taken care of on Friday. Michoel Kunin P. Modesto Charon, M.D.

## 2012-10-22 NOTE — Telephone Encounter (Signed)
Patient was hospitalized at [facility].  Records reviewed. Prescriptions provided by Ander Slade, FNP.

## 2012-10-23 ENCOUNTER — Telehealth: Payer: Self-pay | Admitting: Family Medicine

## 2012-10-29 NOTE — Telephone Encounter (Signed)
Rx written to be faxed. Marieli Rudy P. Modesto Charon, M.D.

## 2012-10-31 ENCOUNTER — Encounter (INDEPENDENT_AMBULATORY_CARE_PROVIDER_SITE_OTHER): Payer: Self-pay

## 2012-10-31 ENCOUNTER — Ambulatory Visit (INDEPENDENT_AMBULATORY_CARE_PROVIDER_SITE_OTHER): Payer: Medicaid Other | Admitting: Family Medicine

## 2012-10-31 ENCOUNTER — Encounter: Payer: Self-pay | Admitting: Family Medicine

## 2012-10-31 VITALS — BP 103/70 | HR 88 | Temp 97.0°F | Wt 111.0 lb

## 2012-10-31 DIAGNOSIS — C069 Malignant neoplasm of mouth, unspecified: Secondary | ICD-10-CM

## 2012-10-31 MED ORDER — WARFARIN SODIUM 5 MG PO TABS: 5.0000 mg | ORAL_TABLET | Freq: Every day | ORAL | Status: DC

## 2012-10-31 NOTE — Patient Instructions (Signed)
Pulmonary Embolus A pulmonary (lung) embolus (PE) is a blood clot that has traveled from another place in the body to the lung. Most clots come from deep veins in the legs or pelvis. PE is a dangerous and potentially life-threatening condition that can be treated if identified. CAUSES Blood clots form in a vein for different reasons. Usually several things cause blood clots. They include:  The flow of blood slows down.  The inside of the vein is damaged in some way.  The person has a condition that makes the blood clot more easily. These conditions may include:  Older age (especially over 75 years old).  Having a history of blood clots.  Having major or lengthy surgery. Hip surgery is particularly high-risk.  Breaking a hip or leg.  Sitting or lying still for a long time.  Cancer or cancer treatment.  Having a long, thin tube (catheter) placed inside a vein during a medical procedure.  Being overweight (obese).  Pregnancy and childbirth.  Medicines with estrogen.  Smoking.  Other circulation or heart problems. SYMPTOMS  The symptoms of a PE usually start suddenly and include:  Shortness of breath.  Coughing.  Coughing up blood or blood-tinged mucus (phlegm).  Chest pain. Pain is often worse with deep breaths.  Rapid heartbeat. DIAGNOSIS  If a PE is suspected, your caregiver will take a medical history and carry out a physical exam. Your caregiver will check for the risk factors listed above. Tests that also may be required include:  Blood tests, including studies of the clotting properties of your blood.  Imaging tests. Ultrasound, CT, MRI, and other tests can all be used to see if you have clots in your legs or lungs. If you have a clot in your legs and have breathing or chest problems, your caregiver may conclude that you have a clot in your lungs. Further lung tests may not be needed.  Electrocardiography can look for heart strain from blood clots in the  lungs. PREVENTION   Exercise the legs regularly. Take a brisk 30 minute walk every day.  Maintain a weight that is appropriate for your height.  Avoid sitting or lying in bed for long periods of time without moving your legs.  Women, particularly those over the age of 35, should consider the risks and benefits of taking estrogen medicines, including birth control pills.  Do not smoke, especially if you take estrogen medicines.  Long-distance travel can increase your risk. You should exercise your legs by walking or pumping the muscles every hour.  In hospital prevention:  Your caregiver will assess your need for preventive PE care (prophylaxis) when you are admitted to the hospital. If you are having surgery, your surgeon will assess you the day of or day after surgery.  Prevention may include medical and nonmedical measures. TREATMENT   The most common treatment for a PE is blood thinning (anticoagulant) medicine, which reduces the blood's tendency to clot. Anticoagulants can stop new blood clots from forming and old ones from growing. They cannot dissolve existing clots. Your body does this by itself over time. Anticoagulants can be given by mouth, by intravenous (IV) access, or by injection. Your caregiver will determine the best program for you.  Less commonly, clot-dissolving drugs (thrombolytics) are used to dissolve a PE. They carry a high risk of bleeding, so they are used mainly in severe cases.  Very rarely, a blood clot in the leg needs to be removed surgically.  If you are unable to   take anticoagulants, your caregiver may arrange for you to have a filter placed in a main vein in your abdomen. This filter prevents clots from traveling to your lungs. HOME CARE INSTRUCTIONS   Take all medicines prescribed by your caregiver. Follow the directions carefully.  Warfarin. Most people will continue taking warfarin after hospital discharge. Your caregiver will advise you on the  length of treatment (usually 3 6 months, sometimes lifelong).  Too much and too little warfarin are both dangerous. Too much warfarin increases the risk of bleeding. Too little warfarin continues to allow the risk for blood clots. While taking warfarin, you will need to have regular blood tests to measure your blood clotting time. These blood tests usually include both the prothrombin time (PT) and International Normalized Ratio (INR) tests. The PT and INR results allow your caregiver to adjust your dose of warfarin. The dose can change for many reasons. It is critically important that you take warfarin exactly as prescribed, and that you have your PT and INR levels drawn exactly as directed.  Many foods, especially foods high in vitamin K can interfere with warfarin and affect the PT and INR results. Foods high in vitamin K include spinach, kale, broccoli, cabbage, collard and turnip greens, brussels sprouts, peas, cauliflower, seaweed, and parsley as well as beef and pork liver, green tea, and soybean oil. You should eat a consistent amount of foods high in vitamin K. Avoid major changes in your diet, or notify your caregiver before changing your diet. Arrange a visit with a dietitian to answer your questions.  Many medicines can interfere with warfarin and affect the PT and INR results. You must tell your caregiver about any and all medicines you take, this includes all vitamins and supplements. Be especially cautious with aspirin and anti-inflammatory medicines. Ask your caregiver before taking these. Do not take or discontinue any prescribed or over-the-counter medicine except on the advice of your caregiver or pharmacist.  Warfarin can have side effects, such as excessive bruising or bleeding. You will need to hold pressure over cuts for longer than usual.  Alcohol can change the body's ability to handle warfarin. It is best to avoid alcoholic drinks or consume only very small amounts while taking  warfarin. Notify your caregiver if you change your alcohol intake.  Notify your dentist or other caregivers before procedures.  Avoid contact sports.  Wear a medical alert bracelet or carry a medical alert card.  Ask your caregiver how soon you can go back to normal activities. Not being active can lead to new clots. Ask for a list of what you should and should not do.  Compression stockings. These are tight elastic stockings that apply pressure to the lower legs. This can help keep the blood in the legs from clotting. You may need to wear compressions stockings at home to help prevent clots.  Smoking. If you smoke, quit. Ask your caregiver for help with quitting smoking.  Learn as much as you can about PE. Educating yourself can help prevent PE from reoccurring. SEEK MEDICAL CARE IF:   You notice a rapid heartbeat.  You feel weaker or more tired than usual.  You feel faint.  You notice increased bruising.  Your symptoms are not getting better in the time expected.  You are having side effects of medicine. SEEK IMMEDIATE MEDICAL CARE IF:   You have chest pain.  You have trouble breathing.  You have new or increased swelling or pain in one leg.  You   cough up blood.  You notice blood in vomit, in a bowel movement, or in urine.  You have an oral temperature above 102 F (38.9 C), not controlled by medicine. You may have another PE. A blood clot in the lungs is a medical emergency. Call your local emergency services (911 in [place] to get to the nearest hospital or clinic. Do not drive yourself. MAKE SURE YOU:   Understand these instructions.  Will watch your condition.  Will get help right away if you are not doing well or get worse. Document Released: 01/08/2000 Document Revised: 07/12/2011 Document Reviewed: 07/14/2008 Exit[employer] Patient Information 2014 [employer].  

## 2012-10-31 NOTE — Progress Notes (Signed)
  Subjective:    Patient ID: Jason Strong, male    DOB: 1960/06/10, 52 y.o.   MRN: [id number]  HPI  Patient is here for follow up.  He has had recent head and neck surgery due to oral cancer. He has hx of pulmonary embolism and has been taking coumadin for anticoagulation.  He has Been off the coumdin post operative period and has been taking lovenox injections.  He has Had some difficulty with a skin tear bleeding and had to stop the lovenox.  He has healed and Is no longer bleeding.  He has home health nurse helping, he still has PEG tube but is now Starting to eat some and swallow liquids.  He reports no difficulty swallowing liquids.  He has A talking trach.  He is talking without problems.  His wife accompanies him and so does His home health nurse.   Review of Systems    No chest pain, SOB, HA, dizziness, vision change, N/V, diarrhea, constipation, dysuria, urinary urgency or frequency, myalgias, arthralgias or rash.  Objective:   Physical Exam  Vital signs noted  Well developed well nourished male in W/C with trach in NAD.  HEENT - Head atraumatic Normocephalic                Eyes - PERRLA, Conjuctiva - clear Sclera- Clear EOMI                Ears - EAC's Wnl TM's Wnl Gross Hearing WNL                Nose - Nares patent                 Throat - oropharanx wnl, trach intact Respiratory - Lungs CTA bilateral Cardiac - RRR S1 and S2 without murmur GI - Peg Tube right upper quadrant      Assessment & Plan:  Pulmonary Embolism - Start back on coumadin 5mg  po qd and follow up for PTINR with Tammy Eckerd Pharm D next week.  Oral Cancer - Patient is doing well with current home health and continue to follow up with ENT.  Deatra Canter FNP

## 2012-11-01 ENCOUNTER — Telehealth: Payer: Self-pay | Admitting: Family Medicine

## 2012-11-02 NOTE — Telephone Encounter (Signed)
THEY CALLED YOU BACK. SAID YOU CALLED THEM, NOT YOUR NURSE ABOUT LABS.

## 2012-11-02 NOTE — Telephone Encounter (Signed)
Please call patient and tell him his labs came back showing elevated wbc and anemia.  He may have some infection going on and I wanted to know if he is experiencing any fever, UA sx's, or if he has been taking any steroids recently.

## 2012-11-05 ENCOUNTER — Other Ambulatory Visit: Payer: Self-pay | Admitting: Family Medicine

## 2012-11-05 DIAGNOSIS — J069 Acute upper respiratory infection, unspecified: Secondary | ICD-10-CM

## 2012-11-05 MED ORDER — FOLIC ACID 1 MG PO TABS: 1.0000 mg | ORAL_TABLET | Freq: Every day | ORAL | Status: DC

## 2012-11-05 MED ORDER — ONDANSETRON HCL 4 MG PO TABS: 4.0000 mg | ORAL_TABLET | Freq: Three times a day (TID) | ORAL | Status: DC | PRN

## 2012-11-05 MED ORDER — SOURCECF PEDIATRIC PO LIQD: 1.0000 mL | Freq: Every day | ORAL | Status: DC

## 2012-11-05 MED ORDER — FERROUS SULFATE 75 (15 FE) MG/ML PO SOLN: 15.0000 mg | Freq: Three times a day (TID) | ORAL | Status: DC

## 2012-11-05 MED ORDER — AMOXICILLIN-POT CLAVULANATE 250-62.5 MG/5ML PO SUSR: 1000.0000 mg | Freq: Two times a day (BID) | ORAL | Status: DC

## 2012-11-05 NOTE — Telephone Encounter (Signed)
Patients daughter states that patient ran a low grade fever day before yesterday and having some cough and congestion. No urinary sxs. Would also like some med refills. States that you were going to try to get a different liquid multivitamin for him because what he takes is so expensive and the bottle is so little. Also wants a refill on iron, folic acid, zofran, and seroquel. Please advise

## 2012-11-05 NOTE — Telephone Encounter (Signed)
Brother aware

## 2012-11-05 NOTE — Telephone Encounter (Signed)
Sent in refills and abx's

## 2012-11-05 NOTE — Telephone Encounter (Signed)
Also daughter states that he has not been on any steroids

## 2012-11-06 ENCOUNTER — Telehealth: Payer: Self-pay | Admitting: *Deleted

## 2012-11-06 ENCOUNTER — Encounter: Payer: Medicaid Other | Admitting: Pharmacist Clinician (PhC)/ Clinical Pharmacy Specialist

## 2012-11-06 NOTE — Telephone Encounter (Signed)
You started pt back on Coumadin last week, do you want him to continue to take asa 325mg  qd? Also, need an hold order for fentanyl patch, till ins. Approves it, will use prn pain med.

## 2012-11-07 NOTE — Telephone Encounter (Signed)
Stop the fentanyl while using prn pain medicine and stop the ASA

## 2012-11-08 ENCOUNTER — Other Ambulatory Visit: Payer: Self-pay | Admitting: Family Medicine

## 2012-11-08 NOTE — Telephone Encounter (Signed)
Called in orders to [facility]

## 2012-11-12 ENCOUNTER — Ambulatory Visit (INDEPENDENT_AMBULATORY_CARE_PROVIDER_SITE_OTHER): Payer: Medicaid Other | Admitting: Pharmacist

## 2012-11-12 ENCOUNTER — Other Ambulatory Visit: Payer: Self-pay | Admitting: Family Medicine

## 2012-11-12 DIAGNOSIS — I2699 Other pulmonary embolism without acute cor pulmonale: Secondary | ICD-10-CM

## 2012-11-12 LAB — POCT INR: INR: 1.4

## 2012-11-12 NOTE — Progress Notes (Signed)
This note and result is being entered for tracking purposes only.  Patient was advised on dosing recommendations by Dr Modesto Charon on Friday, October 17th.

## 2012-11-13 ENCOUNTER — Other Ambulatory Visit: Payer: Self-pay | Admitting: Family Medicine

## 2012-11-13 MED ORDER — FERROUS SULFATE 300 (60 FE) MG/5ML PO SYRP: 300.0000 mg | ORAL_SOLUTION | Freq: Two times a day (BID) | ORAL | Status: DC

## 2012-11-13 NOTE — Telephone Encounter (Signed)
Last filled 10/19/12, percocet will print, route to pool A, so they can call family to pickup rx

## 2012-11-14 ENCOUNTER — Ambulatory Visit (INDEPENDENT_AMBULATORY_CARE_PROVIDER_SITE_OTHER): Payer: Medicaid Other | Admitting: Pharmacist

## 2012-11-14 DIAGNOSIS — I2699 Other pulmonary embolism without acute cor pulmonale: Secondary | ICD-10-CM

## 2012-11-14 MED ORDER — QUETIAPINE FUMARATE 25 MG PO TABS: 25.0000 mg | ORAL_TABLET | Freq: Two times a day (BID) | ORAL | Status: DC

## 2012-11-14 MED ORDER — OXYCODONE-ACETAMINOPHEN 5-325 MG PO TABS: 1.0000 | ORAL_TABLET | ORAL | Status: DC | PRN

## 2012-11-14 NOTE — Progress Notes (Signed)
This encounter for historical information only and to help track patient in anticoagulation clinic.

## 2012-11-15 ENCOUNTER — Telehealth: Payer: Self-pay

## 2012-11-15 LAB — POCT INR: INR: 1.9

## 2012-11-15 NOTE — Telephone Encounter (Signed)
Family member aware to pick up rx for oxycodone 5-325 written by bill oxford . rx at front desk.

## 2012-11-15 NOTE — Telephone Encounter (Signed)
Family member aware percocet rx at front desk ready for pick up

## 2012-11-19 ENCOUNTER — Encounter: Payer: Self-pay | Admitting: *Deleted

## 2012-11-19 ENCOUNTER — Ambulatory Visit (INDEPENDENT_AMBULATORY_CARE_PROVIDER_SITE_OTHER): Payer: Medicaid Other | Admitting: Pharmacist

## 2012-11-19 DIAGNOSIS — I2699 Other pulmonary embolism without acute cor pulmonale: Secondary | ICD-10-CM

## 2012-11-20 LAB — PROTIME-INR
INR: 4.9 — ABNORMAL HIGH (ref 0.8–1.2)
INR: 1.4 — ABNORMAL HIGH (ref 0.8–1.2)
Prothrombin Time: 51.7 s — ABNORMAL HIGH (ref 9.1–12.0)
Prothrombin Time: 14.6 s — ABNORMAL HIGH (ref 9.1–12.0)

## 2012-11-20 LAB — IRON AND TIBC
Iron Saturation: 14 % — ABNORMAL LOW (ref 15–55)
Iron: 35 ug/dL — ABNORMAL LOW (ref 40–155)
TIBC: 252 ug/dL (ref 250–450)
UIBC: 217 ug/dL (ref 150–375)

## 2012-11-20 LAB — CBC WITH DIFFERENTIAL
Basophils Absolute: 0 10*3/uL (ref 0.0–0.2)
Basos: 0 %
Eos: 6 %
Eosinophils Absolute: 0.8 10*3/uL — ABNORMAL HIGH (ref 0.0–0.4)
HCT: 37.7 % (ref 37.5–51.0)
Hemoglobin: 12.6 g/dL (ref 12.6–17.7)
Immature Grans (Abs): 0 10*3/uL (ref 0.0–0.1)
Immature Granulocytes: 0 %
Lymphocytes Absolute: 2.1 10*3/uL (ref 0.7–3.1)
Lymphs: 15 %
MCH: 33.5 pg — ABNORMAL HIGH (ref 26.6–33.0)
MCHC: 33.4 g/dL (ref 31.5–35.7)
MCV: 100 fL — ABNORMAL HIGH (ref 79–97)
Monocytes Absolute: 0.5 10*3/uL (ref 0.1–0.9)
Monocytes: 3 %
Neutrophils Absolute: 10.9 10*3/uL — ABNORMAL HIGH (ref 1.4–7.0)
Neutrophils Relative %: 76 %
Platelets: 475 10*3/uL — ABNORMAL HIGH (ref 150–379)
RBC: 3.76 x10E6/uL — ABNORMAL LOW (ref 4.14–5.80)
RDW: 15.7 % — ABNORMAL HIGH (ref 12.3–15.4)
WBC: 14.4 10*3/uL — ABNORMAL HIGH (ref 3.4–10.8)

## 2012-11-20 LAB — POCT INR: INR: 1.4

## 2012-11-20 NOTE — Telephone Encounter (Signed)
  This encounter was created in error - please disregard. Called in

## 2012-11-21 ENCOUNTER — Ambulatory Visit (INDEPENDENT_AMBULATORY_CARE_PROVIDER_SITE_OTHER): Payer: Medicaid Other | Admitting: Pharmacist

## 2012-11-21 DIAGNOSIS — I2699 Other pulmonary embolism without acute cor pulmonale: Secondary | ICD-10-CM

## 2012-11-23 DIAGNOSIS — Z930 Tracheostomy status: Secondary | ICD-10-CM

## 2012-11-23 DIAGNOSIS — Z931 Gastrostomy status: Secondary | ICD-10-CM

## 2012-11-23 DIAGNOSIS — C069 Malignant neoplasm of mouth, unspecified: Secondary | ICD-10-CM

## 2012-11-26 ENCOUNTER — Ambulatory Visit: Payer: Self-pay | Admitting: Pharmacist

## 2012-11-26 DIAGNOSIS — I2699 Other pulmonary embolism without acute cor pulmonale: Secondary | ICD-10-CM

## 2012-11-26 LAB — POCT INR: INR: 1.6

## 2012-11-26 NOTE — Progress Notes (Signed)
INR improved but still sub therapeutic.  Take 2 warfarin today, then increase dose to 1 tablet daily except 1/2 tablet on mondays and fridays.  Toniann Fail at [facility] called with order to recheck protime in 1 week.

## 2012-12-04 ENCOUNTER — Telehealth: Payer: Self-pay | Admitting: *Deleted

## 2012-12-04 NOTE — Telephone Encounter (Signed)
Per V. O. By Ander Slade continue same dose and rck PT in one week. I called [facility]

## 2012-12-04 NOTE — Telephone Encounter (Signed)
Had protime today, INR is 2.3, needs coumadin orders, Tammy gave them through today! Let me know and I will call them

## 2012-12-10 ENCOUNTER — Telehealth: Payer: Self-pay | Admitting: Family Medicine

## 2012-12-10 MED ORDER — OXYCODONE-ACETAMINOPHEN 5-325 MG PO TABS: 1.0000 | ORAL_TABLET | ORAL | Status: DC | PRN

## 2012-12-10 MED ORDER — CLONAZEPAM 0.5 MG PO TABS: 0.5000 mg | ORAL_TABLET | Freq: Two times a day (BID) | ORAL | Status: DC | PRN

## 2012-12-10 NOTE — Telephone Encounter (Signed)
rx ready for pickup 

## 2012-12-10 NOTE — Telephone Encounter (Signed)
Patient aware rx ready to pick up 

## 2012-12-11 LAB — PROTIME-INR

## 2012-12-13 ENCOUNTER — Ambulatory Visit: Payer: Self-pay | Admitting: Pharmacist

## 2012-12-13 DIAGNOSIS — I2699 Other pulmonary embolism without acute cor pulmonale: Secondary | ICD-10-CM

## 2012-12-19 ENCOUNTER — Encounter: Payer: Self-pay | Admitting: Radiation Oncology

## 2012-12-19 NOTE — Progress Notes (Signed)
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  Patient presented in April and went to the dentist.  The dentist found a knot in his mouth.  Biopsies of tongue (if applicable) revealed: Tongue, biopsy - INVASIVE WELL DIFFERENTIATED SQUAMOUS CELL CARCINOMA.  Nutrition Status:  Weight changes: 30 lb weight loss since July.  Swallowing status: Able to eat soft foods.   Plans, if any, for PEG tube: has a peg tube.  He can't remember the name of his tube feeding but he takes 1 can every 4 hours.  Tobacco/Marijuana/Snuff/ETOH use: 2 pack per day, 35 year smoking history.  Now smokes 3-4 cigarettes per day.  Drank 15 beers per week  Past/Anticipated interventions by otolaryngology, if any: on 08/08/12 had HEMIGLOSSECTOMY,  TRACHEOSTOMY, SELECTIVE NECK DISSECTION,  FREE FLAP RADIAL FOREARM, SKIN GRAFT SPLIT THICKNESS LEG / FOOT.    Past/Anticipated interventions by medical oncology, if any: unknown.  Referrals yet, to any of the following?  Social Work? no  Dentistry? Saw dentist before his surgery in July.  Swallowing therapy? no  Nutrition? no  Med/Onc? no  PEG placement? Has a peg tube  SAFETY ISSUES:  Prior radiation? no  Pacemaker/ICD? no  Possible current pregnancy? no  Is the patient on methotrexate? no  Current Complaints / other details:  Recently noticed left neck fullness.  He has been seen by [facility] Radiation Oncology but would like to be seen at [facility] because it is closer to home.  He has left sided arm and leg weakness from a CVA in January.  He has a tracheistomy.  He is here with his sister.  He has one daughter.

## 2012-12-24 ENCOUNTER — Encounter: Payer: Self-pay | Admitting: *Deleted

## 2012-12-24 ENCOUNTER — Ambulatory Visit
Admission: RE | Admit: 2012-12-24 | Discharge: 2012-12-24 | Disposition: A | Discharge status: Home / Self Care | Payer: Medicaid Other | Source: Ambulatory Visit | Attending: Radiation Oncology | Admitting: Radiation Oncology

## 2012-12-24 ENCOUNTER — Encounter: Payer: Self-pay | Admitting: Radiation Oncology

## 2012-12-24 VITALS — BP 111/74 | HR 93 | Temp 97.5°F | Ht 67.0 in | Wt 118.7 lb

## 2012-12-24 DIAGNOSIS — F17200 Nicotine dependence, unspecified, uncomplicated: Secondary | ICD-10-CM | POA: Insufficient documentation

## 2012-12-24 DIAGNOSIS — Z79899 Other long term (current) drug therapy: Secondary | ICD-10-CM | POA: Insufficient documentation

## 2012-12-24 DIAGNOSIS — K148 Other diseases of tongue: Secondary | ICD-10-CM

## 2012-12-24 DIAGNOSIS — K219 Gastro-esophageal reflux disease without esophagitis: Secondary | ICD-10-CM | POA: Insufficient documentation

## 2012-12-24 DIAGNOSIS — Z86711 Personal history of pulmonary embolism: Secondary | ICD-10-CM | POA: Insufficient documentation

## 2012-12-24 DIAGNOSIS — Z7901 Long term (current) use of anticoagulants: Secondary | ICD-10-CM | POA: Insufficient documentation

## 2012-12-24 DIAGNOSIS — C029 Malignant neoplasm of tongue, unspecified: Secondary | ICD-10-CM | POA: Insufficient documentation

## 2012-12-24 DIAGNOSIS — I10 Essential (primary) hypertension: Secondary | ICD-10-CM | POA: Insufficient documentation

## 2012-12-24 DIAGNOSIS — C023 Malignant neoplasm of anterior two-thirds of tongue, part unspecified: Secondary | ICD-10-CM

## 2012-12-24 DIAGNOSIS — I69959 Hemiplegia and hemiparesis following unspecified cerebrovascular disease affecting unspecified side: Secondary | ICD-10-CM | POA: Insufficient documentation

## 2012-12-24 DIAGNOSIS — C770 Secondary and unspecified malignant neoplasm of lymph nodes of head, face and neck: Secondary | ICD-10-CM | POA: Insufficient documentation

## 2012-12-24 HISTORY — DX: Other pulmonary embolism without acute cor pulmonale: I26.99

## 2012-12-24 HISTORY — DX: Anxiety disorder, unspecified: F41.9

## 2012-12-24 HISTORY — DX: Malignant neoplasm of tongue, unspecified: C02.9

## 2012-12-24 HISTORY — DX: Pneumonia, unspecified organism: J18.9

## 2012-12-24 NOTE — Progress Notes (Signed)
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 male who is seen out courtesy of Dr. Gwendalyn Ege for an opinion concerning radiation therapy as part of management of the patient's recurrent oral tongue/floor of mouth tumor. Earlier this year the patient presented with advanced left oral tongue lesion.  the patient underwent resection and reconstructive surgery at [facility]. This was complicated by severe respiratory illness requiring prolonged ventilation. Patient's intraoral wound dehisced anteriorly. Patient was noted to have exposed bone anteriorly and visible plate. 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 Recent followup this fall he was noted to have a left neck node. He proceeded to undergo reimaging including CT scans and PET scan.  On CT scan the patient was noted to have a 1.8 cm centrally necrotic mass within the left level II nodal region consistent with the recurrence. PET scan confirmed activity in this lymph node mass. There was no other areas of obvious recurrence. Postoperative changes were noted in the oral cavity. Patient was presented at the tumor board at [facility] and the patient was not felt to be a candidate for surgery. It was recommended patient be evaluated for radiation therapy and possibly chemotherapy. The patient and his family desires treatments closer to his residence in [place] and wishes to be treated in [place] or [place].  PREVIOUS RADIATION THERAPY: No  PAST MEDICAL HISTORY:  has a past medical history of  History of stomach ulcers; Hypertension; GERD (gastroesophageal reflux disease); Stroke (01/2012); ICH (intracerebral hemorrhage) (02/20/2012); Pulmonary embolism; Anxiety; Tongue cancer; and Pneumonia.    PAST SURGICAL HISTORY: Past Surgical History  Procedure Laterality Date  . Craniectomy  02/20/2012    Procedure: CRANIECTOMY POSTERIOR FOSSA DECOMPRESSION;  Surgeon: Temple Pacini, MD;  Location: [facility]UR[facility] OR[facility];  Service: Neurosurgery;  Laterality: N/A;  Craniectomy Posterior Fossa Decompression   . Appendectomy      "when I was young" (06/19/2012)  . Hemiglossectomy Left 08/08/2012  . Mandibulectomy  08/08/2012    with plating, left side  . Tracheostomy  08/08/2012  . Selective neck dissection  08/08/2012  . Free flap radial forearm  08/08/2012  . Skin graft split thickness leg / foot  08/08/2012  . Gastrostomy tube placement  09/03/2012    FAMILY HISTORY: family history includes Ovarian cancer in his mother; Stroke in his father.  SOCIAL HISTORY:  reports that he has been smoking Cigarettes.  He has a 70 pack-year smoking history. He has never used smokeless tobacco. He reports that he does not drink alcohol.  ALLERGIES: Bee venom  MEDICATIONS:  Current Outpatient Prescriptions  Medication Sig Dispense Refill  . clonazePAM (KLONOPIN) 0.5 MG tablet Take 1 tablet (0.5 mg total) by mouth 2 (two) times daily as needed for anxiety.  60 tablet  0  . ondansetron (ZOFRAN) 4 MG tablet Take 1 tablet (4 mg total) by mouth every 8 (eight) hours as needed for nausea.  20 tablet  3  . oxyCODONE-acetaminophen (PERCOCET/ROXICET) 5-325 MG per tablet Take 1 tablet by mouth every 4 (four) hours as needed.  90 tablet  0  . QUEtiapine (SEROQUEL) 25 MG tablet Take 1 tablet (25 mg total) by mouth 2 (two) times daily.  60 tablet  2  . warfarin (COUMADIN) 5 MG tablet Take 1 tablet (5 mg total) by mouth daily.  30 tablet  3  . acetaminophen (TYLENOL) 160 MG/5ML solution 650 mg.      . albuterol (PROVENTIL) (2.5  MG/3ML) 0.083% nebulizer solution 2.5 mg.      . amoxicillin-clavulanate (AUGMENTIN) 250-62.5 MG/5ML suspension Take 20 mLs (1,000 mg total) by mouth 2 (two) times daily.  280 mL  0  . fentaNYL (DURAGESIC - DOSED MCG/HR) 25 MCG/HR patch 1 patch.      . ferrous sulfate 300 (60 FE) MG/5ML syrup Take 5 mLs (300 mg total) by mouth 2 (two) times daily.  150 mL  3  . folic acid (FOLVITE) 1 MG tablet Take 1 tablet (1 mg total) by mouth daily.  30 tablet  11  . multivitamins-fortified A-D-K (SOURCECF) solution Take 1 mL by mouth daily. 1 mL by Per G Tube route daily.  60 mL  11  . warfarin (COUMADIN) 2 MG tablet Take 1 tablet (2 mg total) by mouth daily.  30 tablet  3   No current facility-administered medications for this encounter.    REVIEW OF SYSTEMS:  A 15 point review of systems is documented in the electronic medical record. This was obtained by the nursing staff. However, I reviewed this with the patient to discuss relevant findings and make appropriate changes. He denies any headaches. The patient denies any pain along the oral cavity or neck region.. Patient has started eating some liquids and soft foods at this time. Most of his nutrition does come from his feeding tube. He has weakness in left arm and to a lesser degree in his left leg. Patient ambulates with the assistance of a walker.  He periodically will have shaking movements involving his left arm and shoulder region and inability to control his movements.  He denies any breathing problems at this time or blood-tinged sputum.   PHYSICAL EXAM:  height is 5\' 7"  (1.702 m) and weight is 118 lb 11.2 oz (53.842 kg). His temperature is 97.5 F (36.4 C). His blood pressure is 111/74 and his pulse is 93. His oxygen saturation is 97%.   BP 111/74  Pulse 93  Temp(Src) 97.5 F (36.4 C)  Ht 5\' 7"  (1.702 m)  Wt 118 lb 11.2 oz (53.842 kg)  BMI 18.59 kg/m2  SpO2 97%  General Appearance:    Alert, cooperative, no distress, appears stated age,  accompanied by one of his sisters on evaluation today   Head:    Normocephalic, without obvious abnormality, atraumatic  Eyes:    PERRL, conjunctiva/corneas clear, EOM's intact,          Nose:   Nares normal, septum midline, mucosa normal, no drainage    or sinus tenderness  Throat:   patient has surgical defect along the left floor of mouth region and reconstructed area. There is exposed the metal plate in the anterior oral cavity and exposed bone in the region of the anterior mandible.   patient has poor oral hygiene and scant teeth remaining.   Neck:  palpable 3 by 3.5 cm  nodal mass in the upper anterior neck along the left side. There surgical changes noted along the left neck from his prior dissection. A tracheostomy is in place in the lower neck area. No palpable adenopathy in the  right neck or supraclavicular region.           Back:     Symmetric, no curvature, ROM normal, no CVA tenderness  Lungs:     Clear to auscultation bilaterally, respirations unlabored  Chest wall:    No tenderness or deformity  Heart:    Regular rate and rhythm, S1 and S2 normal, no murmur, rub   or gallop  Abdomen:     Soft, non-tender, bowel sounds active all four quadrants,    no masses, no organomegaly, PEG in place        Extremities:   Extremities normal, atraumatic, no cyanosis or edema  Pulses:   2+ and symmetric all extremities  Skin:   donor sites located in the left forearm and left thigh region for floor of mouth reconstruction   Lymph nodes:   axillary nodes normal  Neurologic:   weakness and spasticity noted in the left upper extremity, some mild weakness noted in the left lower extremity.          ECOG = 2   LABORATORY DATA:  Lab Results  Component Value Date   WBC 14.4* 11/12/2012   HGB 12.6 11/12/2012   HCT 37.7 11/12/2012   MCV 100* 11/12/2012   PLT 475* 11/12/2012   Lab Results  Component Value Date   NA 137 06/20/2012   K 3.6 06/20/2012   CL 103 06/20/2012   CO2 24  06/20/2012   Lab Results  Component Value Date   ALT 11 06/20/2012   AST 17 06/20/2012   ALKPHOS 81 06/20/2012   BILITOT 0.3 06/20/2012     RADIOGRAPHY: No results found.    IMPRESSION: T4aN2bM0 SCCA of the Left FOM/tongue, now recurrent to left neck. The patient presents a difficult situation. Ideally the patient should have surgery followed by postoperative radiation and radiosensitizing chemotherapy, however his performance status would not allow this aggressive treatment. Radiation treatment at this time should ideally include his surgical bed in the oral cavity as well as neck region with significant boost to the area of recurrence in the left upper neck. As above the patient has exposed plate and bone in the oral cavity and with radiation therapy this would present significant management issue with high risk for wound breakdown and infection.   Most reasonable approach at this time would be to proceed with radiation treatments in a limited fashion directed at the area of recurrence in the left neck and continue with close followup. The patient understands he will likely have additional recurrences given his advanced presentation and significant delay in initiation of post operative treatment.  PLAN: The patient will call back this week to let us know of his desires concerning treatment location i.e. [place] or [place]. I spent 60 minutes minutes face to face with the patient and more than 50% of that time was spent in counseling and/or coordination of care.   ------------------------------------------------  -----------------------------------  Billie Lade, PhD, MD

## 2012-12-24 NOTE — Progress Notes (Signed)
Met with patient during scheduled appointment with Dr. Roselind Messier, introduced myself and explained my role as his Navigator.  Beginning to navigate as L1 (new) patient.    Young Berry, RN, BSN, [facility] Head & Neck Oncology Navigator [phone]

## 2012-12-24 NOTE — Progress Notes (Signed)
Please see the Nurse Progress Note in the MD Initial Consult Encounter for this patient. 

## 2012-12-27 ENCOUNTER — Telehealth: Payer: Self-pay | Admitting: *Deleted

## 2012-12-27 NOTE — Telephone Encounter (Signed)
Spoke with patient's brother (and caretaker) who indicated patient and family want to proceed with treatment at [facility], not [place].  Decision relayed to Dr. Roselind Messier.  Young Berry, RN, BSN, [facility] Head & Neck Oncology Navigator [phone]

## 2012-12-31 ENCOUNTER — Ambulatory Visit (INDEPENDENT_AMBULATORY_CARE_PROVIDER_SITE_OTHER): Payer: Medicaid Other | Admitting: Pharmacist

## 2012-12-31 ENCOUNTER — Telehealth: Payer: Self-pay | Admitting: Pharmacist

## 2012-12-31 DIAGNOSIS — I2699 Other pulmonary embolism without acute cor pulmonale: Secondary | ICD-10-CM

## 2012-12-31 NOTE — Progress Notes (Signed)
INR drawn by home health agency - [facility].  No charge for visit.

## 2012-12-31 NOTE — Telephone Encounter (Signed)
Called with instructions for warfarin see anticoag note.

## 2013-01-01 ENCOUNTER — Other Ambulatory Visit: Payer: Self-pay | Admitting: *Deleted

## 2013-01-01 ENCOUNTER — Encounter: Payer: Self-pay | Admitting: Radiation Oncology

## 2013-01-01 MED ORDER — FERROUS SULFATE 300 (60 FE) MG/5ML PO SYRP: 300.0000 mg | ORAL_SOLUTION | Freq: Two times a day (BID) | ORAL | Status: DC

## 2013-01-01 NOTE — Progress Notes (Signed)
   Department of Radiation Oncology  Phone:  [phone] Fax:        [phone]  Progress note  Yesterday I spoke with Dr. Hezzie Bump, Jason Strong's surgeon. I discussed the physical exam findings of exposed plate and mandible. In light of these issues his surgeon agrees with treatment only to the left lymph node recurrence. The patient therefore will not require dental evaluation and can proceed with planning later this week.  -----------------------------------  Billie Lade, PhD, MD

## 2013-01-03 ENCOUNTER — Ambulatory Visit: Payer: Medicaid Other

## 2013-01-03 ENCOUNTER — Ambulatory Visit
Admission: RE | Admit: 2013-01-03 | Discharge: 2013-01-03 | Disposition: A | Discharge status: Home / Self Care | Payer: Medicaid Other | Source: Ambulatory Visit | Attending: Radiation Oncology | Admitting: Radiation Oncology

## 2013-01-03 ENCOUNTER — Telehealth: Payer: Self-pay | Admitting: Oncology

## 2013-01-03 DIAGNOSIS — C023 Malignant neoplasm of anterior two-thirds of tongue, part unspecified: Secondary | ICD-10-CM | POA: Insufficient documentation

## 2013-01-03 DIAGNOSIS — C50919 Malignant neoplasm of unspecified site of unspecified female breast: Secondary | ICD-10-CM | POA: Insufficient documentation

## 2013-01-03 DIAGNOSIS — Z510 Encounter for antineoplastic radiation therapy: Secondary | ICD-10-CM | POA: Insufficient documentation

## 2013-01-03 DIAGNOSIS — Z79899 Other long term (current) drug therapy: Secondary | ICD-10-CM | POA: Insufficient documentation

## 2013-01-03 DIAGNOSIS — Z7901 Long term (current) use of anticoagulants: Secondary | ICD-10-CM | POA: Insufficient documentation

## 2013-01-03 DIAGNOSIS — R220 Localized swelling, mass and lump, head: Secondary | ICD-10-CM | POA: Insufficient documentation

## 2013-01-03 DIAGNOSIS — R5381 Other malaise: Secondary | ICD-10-CM | POA: Insufficient documentation

## 2013-01-03 LAB — BUN AND CREATININE (CC13): BUN: 9.4 mg/dL (ref 7.0–26.0)

## 2013-01-03 NOTE — Progress Notes (Signed)
  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  Identity was confirmed.  All relevant records and images related to the planned course of therapy were reviewed.  The patient freely provided informed written consent to proceed with treatment after reviewing the details related to the planned course of therapy. The consent form was witnessed and verified by the simulation staff.  Then, the patient was set-up in a stable reproducible  supine position for radiation therapy.  CT images were obtained.  Surface markings were placed.  The CT images were loaded into the planning software.  Then the target and avoidance structures were contoured.  Treatment planning then occurred.  The radiation prescription was entered and confirmed.  Then, I designed and supervised the construction of a total of 1 medically necessary complex treatment devices.  I have requested : Intensity Modulated Radiotherapy (IMRT) is medically necessary for this case for the following reason:  Parotid sparing..  I have ordered:dose calc.  PLAN:  The patient will receive 66 in 30 fractions with a simultaneous integrated boost technique using helical intensity modulated radiation therapy.  ________________________________  -----------------------------------  Billie Lade, PhD, MD

## 2013-01-03 NOTE — Telephone Encounter (Signed)
Talked to Jeanie Cooks Adams's caregiver.  Asked her to bring Maisie Fus at 11:30 to have labs drawn before his CT [facility].  She said she would get him here by 11:30.

## 2013-01-03 NOTE — Progress Notes (Signed)
Jason Strong here with his caretaker for an IV start.  Attempted once in right wrist.  Sonda Rumble, RN inserted #22 gauge in right posterier lower arm.  Blood return noted.  Notified Kristi in CT SIM.

## 2013-01-14 ENCOUNTER — Encounter: Payer: Self-pay | Admitting: *Deleted

## 2013-01-14 ENCOUNTER — Ambulatory Visit
Admission: RE | Admit: 2013-01-14 | Discharge: 2013-01-14 | Disposition: A | Discharge status: Home / Self Care | Payer: Medicaid Other | Source: Ambulatory Visit | Attending: Radiation Oncology | Admitting: Radiation Oncology

## 2013-01-14 DIAGNOSIS — C023 Malignant neoplasm of anterior two-thirds of tongue, part unspecified: Secondary | ICD-10-CM

## 2013-01-14 NOTE — Progress Notes (Signed)
   Department of Radiation Oncology  Phone:  [phone] Fax:        [phone]  IMRT Device Note  Today the patient began his radiation therapy directed at the left neck region. The patient will be treated with helical IMRT. He had development of his IMRT treatment device. The patient will be treated with 12.2 sinogram segments. This constitutes 1 IMRT device.  -----------------------------------  Billie Lade, PhD, MD

## 2013-01-14 NOTE — Progress Notes (Signed)
Met with patient and his family during initial RT to provide support, encouragement and care continuity.  Answered questions about upcoming treatments.  Dr. Roselind Messier confirmed with me later that because of tmt area, Speech therapy is not needed; perhaps a Nutrition consult later.  Pt is currently receiving tube feedings at home.  Continuing to navigate as L1 (new) patient.  Young Berry, RN, BSN, [facility] Head & Neck Oncology Navigator [phone]

## 2013-01-15 ENCOUNTER — Ambulatory Visit
Admission: RE | Admit: 2013-01-15 | Discharge: 2013-01-15 | Disposition: A | Discharge status: Home / Self Care | Payer: Medicaid Other | Source: Ambulatory Visit | Attending: Radiation Oncology | Admitting: Radiation Oncology

## 2013-01-15 ENCOUNTER — Encounter: Payer: Self-pay | Admitting: *Deleted

## 2013-01-15 VITALS — BP 114/81 | HR 82 | Temp 97.8°F | Ht 67.0 in | Wt 117.2 lb

## 2013-01-15 DIAGNOSIS — C023 Malignant neoplasm of anterior two-thirds of tongue, part unspecified: Secondary | ICD-10-CM

## 2013-01-15 MED ORDER — BIAFINE EX EMUL
Freq: Two times a day (BID) | CUTANEOUS | Status: DC
  Administered 2013-01-15: 16:00:00 via TOPICAL

## 2013-01-15 NOTE — Progress Notes (Signed)
Jason Strong has had 2 fractions to his left neck.  He is here in a wheelchair.  He denies pain.  He has a peg tube and is taking in 1 can of osmolite every 4 hours.  He is also able to eat soup and mashed potatoes and gravy.  His oral mucosa is intact.  He was given the radiation therapy and you book and discussed pain, mouth changes, skin changes, fatigue and throat changes.  He was given biafine cream and was instructed to apply it to his neck twice a day after treatment and at bedtime.

## 2013-01-15 NOTE — Progress Notes (Signed)
[facility] Psychosocial Distress Screening Clinical Social Work  Clinical Social Work was referred by distress screening protocol.  The patient scored a 5 on the Psychosocial Distress Thermometer which indicates moderate distress. Clinical Social Worker met with patient in radiation oncology exam room to assess for distress and other psychosocial needs. The patient reports no concerns and feels he has adequate support.  CSW briefly explained CSW role and support services at [facility].  CSW encouraged patient/family to call with any questions or concerns.   Clinical Social Worker follow up needed: no  If yes, follow up plan:   Kathrin Penner, MSW, LCSW Clinical Social Worker [facility] [phone]

## 2013-01-15 NOTE — Progress Notes (Signed)
[facility]    Radiation Oncology [address] [place]     Maryln Gottron, M.D. [place], [place] [address]               Billie Lade, M.D., Ph.D. Phone: [phone]      Molli Hazard A. Kathrynn Running, M.D. Fax: [phone]      Radene Gunning, M.D., Ph.D.         Lurline Hare, M.D.         Grayland Jack, M.D Weekly Treatment Management Note  Name: Jason Strong     MRN: [id number]        CSN: [id number] Date: 01/15/2013      DOB: 11/27/1960  CC: Redmond Baseman, MD         Modesto Charon    Status: Outpatient  Diagnosis: The encounter diagnosis was Malignant neoplasm of anterior two-thirds of tongue, part unspecified.  Current Dose: 4.4 Gy  Current Fraction: 2  Planned Dose: 66 GY  Narrative: Jason Strong was seen today for weekly treatment management. The chart was checked and MVCT  were reviewed. He is tolerating his radiation treatments well at this time without any side effects.  He can eat some soft foods and drink liquids.  Bee venom Current Outpatient Prescriptions  Medication Sig Dispense Refill  . clonazePAM (KLONOPIN) 0.5 MG tablet Take 1 tablet (0.5 mg total) by mouth 2 (two) times daily as needed for anxiety.  60 tablet  0  . emollient (BIAFINE) cream Apply topically 2 (two) times daily.      . ferrous sulfate 300 (60 FE) MG/5ML syrup Take 5 mLs (300 mg total) by mouth 2 (two) times daily.  150 mL  3  . folic acid (FOLVITE) 1 MG tablet Take 1 tablet (1 mg total) by mouth daily.  30 tablet  11  . multivitamins-fortified A-D-K (SOURCECF) solution Take 1 mL by mouth daily. 1 mL by Per G Tube route daily.  60 mL  11  . ondansetron (ZOFRAN) 4 MG tablet Take 1 tablet (4 mg total) by mouth every 8 (eight) hours as needed for nausea.  20 tablet  3  . oxyCODONE-acetaminophen (PERCOCET/ROXICET) 5-325 MG per tablet Take 1 tablet by mouth every 4 (four) hours as needed.  90 tablet  0  . QUEtiapine (SEROQUEL) 25 MG tablet Take 1 tablet (25 mg total) by mouth 2 (two)  times daily.  60 tablet  2  . warfarin (COUMADIN) 5 MG tablet Take 1 tablet (5 mg total) by mouth daily.  30 tablet  3  . acetaminophen (TYLENOL) 160 MG/5ML solution 650 mg.      . albuterol (PROVENTIL) (2.5 MG/3ML) 0.083% nebulizer solution 2.5 mg.      . fentaNYL (DURAGESIC - DOSED MCG/HR) 25 MCG/HR patch 1 patch.       Current Facility-Administered Medications  Medication Dose Route Frequency Provider Last Rate Last Dose  . topical emolient (BIAFINE) emulsion   Topical BID Billie Lade, MD       Labs:  Lab Results  Component Value Date   WBC 14.4* 11/12/2012   HGB 12.6 11/12/2012   HCT 37.7 11/12/2012   MCV 100* 11/12/2012   PLT 475* 11/12/2012   Lab Results  Component Value Date   CREATININE 0.8 01/03/2013   BUN 9.4 01/03/2013   NA 137 06/20/2012   K 3.6 06/20/2012   CL 103 06/20/2012   CO2 24 06/20/2012   Lab Results  Component Value Date  ALT 11 06/20/2012   AST 17 06/20/2012   PHOS 3.5 02/21/2012   BILITOT 0.3 06/20/2012    Physical Examination:  Filed Vitals:   01/15/13 1425  BP: 114/81  Pulse: 82  Temp: 97.8 F (36.6 C)    Wt Readings from Last 3 Encounters:  01/15/13 117 lb 3.2 oz (53.162 kg)  01/03/13 117 lb 3.2 oz (53.162 kg)  10/31/12 111 lb (50.349 kg)    The left neck node is unchanged on exam today. There is no drainage noted from the left neck region. Lungs - Normal respiratory effort, chest expands symmetrically. Lungs are clear to auscultation, no crackles or wheezes.  Heart has regular rhythm and rate  Abdomen is soft and non tender with normal bowel sounds  Assessment:  Patient tolerating treatments well  Plan: Continue treatment per original radiation prescription

## 2013-01-16 ENCOUNTER — Ambulatory Visit
Admission: RE | Admit: 2013-01-16 | Discharge: 2013-01-16 | Disposition: A | Discharge status: Home / Self Care | Payer: Medicaid Other | Source: Ambulatory Visit | Attending: Radiation Oncology | Admitting: Radiation Oncology

## 2013-01-18 ENCOUNTER — Ambulatory Visit
Admission: RE | Admit: 2013-01-18 | Discharge: 2013-01-18 | Disposition: A | Discharge status: Home / Self Care | Payer: Medicaid Other | Source: Ambulatory Visit | Attending: Radiation Oncology | Admitting: Radiation Oncology

## 2013-01-21 ENCOUNTER — Telehealth: Payer: Self-pay | Admitting: Family Medicine

## 2013-01-21 ENCOUNTER — Encounter: Payer: Self-pay | Admitting: *Deleted

## 2013-01-21 ENCOUNTER — Ambulatory Visit
Admission: RE | Admit: 2013-01-21 | Discharge: 2013-01-21 | Disposition: A | Discharge status: Home / Self Care | Payer: Medicaid Other | Source: Ambulatory Visit | Attending: Radiation Oncology | Admitting: Radiation Oncology

## 2013-01-22 ENCOUNTER — Ambulatory Visit
Admission: RE | Admit: 2013-01-22 | Discharge: 2013-01-22 | Disposition: A | Discharge status: Home / Self Care | Payer: Medicaid Other | Source: Ambulatory Visit | Attending: Radiation Oncology | Admitting: Radiation Oncology

## 2013-01-22 VITALS — BP 112/78 | HR 83 | Temp 97.7°F | Ht 67.0 in | Wt 118.5 lb

## 2013-01-22 DIAGNOSIS — C023 Malignant neoplasm of anterior two-thirds of tongue, part unspecified: Secondary | ICD-10-CM

## 2013-01-22 MED ORDER — OXYCODONE HCL 5 MG PO TABS: ORAL_TABLET | ORAL | Status: DC

## 2013-01-22 NOTE — Progress Notes (Signed)
Jason Strong has had 6 fractions to his left neck.  He reports pain in his throat at a 6/10.  He was taking percocet twice a day but ran out.  He is requesting a refill.  His oral mucosa is intact.  He reports a dry mouth and a change in taste.  He has a tracheostomy that is clean and intact.  He takes most of his nutrition through his peg tube.  He is getting 1 can of osmolite q 4 hours and 200 ml of water q 4.  He does eat liquids by mouth.  He has gained a pound this week.  The skin on his left neck is red.  He is applying biafine cream twice a day.  He denies fatigue but is having trouble sleeping.  He is currently taking 0.5 mg tablet of clonazepam at bedtime but it is not working.

## 2013-01-22 NOTE — Progress Notes (Signed)
Weekly Management Note Current Dose:13.2 Gy  Projected Dose:66 Gy   Narrative:  The patient presents for routine under treatment assessment.  CBCT/MVCT images/Port film x-rays were reviewed.  The chart was checked. Doing well. Needs pain medications. Taking 1-2 percocet 2 times a day with good pain relief. Most food through his tube. Eating some soft foods. Not sleeping well. Klonopin is not helping.   Physical Findings:  Left neck mass. Skin is red.   Vitals:  Filed Vitals:   01/22/13 1714  BP: 112/78  Pulse: 83  Temp: 97.7 F (36.5 C)   Weight:  Wt Readings from Last 3 Encounters:  01/22/13 118 lb 8 oz (53.751 kg)  01/15/13 117 lb 3.2 oz (53.162 kg)  01/03/13 117 lb 3.2 oz (53.162 kg)   Lab Results  Component Value Date   WBC 14.4* 11/12/2012   HGB 12.6 11/12/2012   HCT 37.7 11/12/2012   MCV 100* 11/12/2012   PLT 475* 11/12/2012   Lab Results  Component Value Date   CREATININE 0.8 01/03/2013   BUN 9.4 01/03/2013   NA 137 06/20/2012   K 3.6 06/20/2012   CL 103 06/20/2012   CO2 24 06/20/2012     Impression:  The patient is tolerating radiation.  Plan:  Continue treatment as planned. Gave oxycodone and instructions on use. See if pain meds and klonpin will help with sleep.

## 2013-01-23 ENCOUNTER — Ambulatory Visit
Admission: RE | Admit: 2013-01-23 | Discharge: 2013-01-23 | Disposition: A | Discharge status: Home / Self Care | Payer: Medicaid Other | Source: Ambulatory Visit | Attending: Radiation Oncology | Admitting: Radiation Oncology

## 2013-01-23 NOTE — Progress Notes (Signed)
Met with patient and family during scheduled RT to provide support and encouragement.  Pt indicated "doing OK"; no expressed concerns from family.  Beginning navigation as L2 (treatments established) patient with this encounter.  Young Berry, RN, BSN, [facility] Navigator [phone]

## 2013-01-25 ENCOUNTER — Ambulatory Visit
Admission: RE | Admit: 2013-01-25 | Discharge: 2013-01-25 | Disposition: A | Discharge status: Home / Self Care | Payer: Medicaid Other | Source: Ambulatory Visit | Attending: Radiation Oncology | Admitting: Radiation Oncology

## 2013-01-25 ENCOUNTER — Other Ambulatory Visit: Payer: Self-pay | Admitting: Family Medicine

## 2013-01-25 MED ORDER — CLONAZEPAM 0.5 MG PO TABS: 0.5000 mg | ORAL_TABLET | Freq: Two times a day (BID) | ORAL | Status: DC | PRN

## 2013-01-25 NOTE — Telephone Encounter (Signed)
Needs to be filled by his specialists.

## 2013-01-25 NOTE — Telephone Encounter (Signed)
Rx ready for nurse to Phone in.Klonipin

## 2013-01-25 NOTE — Telephone Encounter (Signed)
Pt awrare rx called to [facility] for klonopin

## 2013-01-28 ENCOUNTER — Encounter: Payer: Self-pay | Admitting: *Deleted

## 2013-01-28 ENCOUNTER — Ambulatory Visit
Admission: RE | Admit: 2013-01-28 | Discharge: 2013-01-28 | Disposition: A | Discharge status: Home / Self Care | Payer: Medicaid Other | Source: Ambulatory Visit | Attending: Radiation Oncology | Admitting: Radiation Oncology

## 2013-01-29 ENCOUNTER — Ambulatory Visit
Admission: RE | Admit: 2013-01-29 | Discharge: 2013-01-29 | Disposition: A | Discharge status: Home / Self Care | Payer: Medicaid Other | Source: Ambulatory Visit | Attending: Radiation Oncology | Admitting: Radiation Oncology

## 2013-01-29 ENCOUNTER — Ambulatory Visit: Admission: RE | Admit: 2013-01-29 | Payer: Medicaid Other | Source: Ambulatory Visit | Admitting: Radiation Oncology

## 2013-01-29 NOTE — Progress Notes (Signed)
Met with patient and his family during scheduled RT to provide support and care continuity.  Patient recognized increasing dry mouth and thickened saliva.  I provided s-in-law [name] directions for preparing salt/baking soda rinse, encouraged frequent use throughout day.  They indicated understanding.  Continuing to navigate as L2 (treatments established) patient.  Gayleen Orem, RN, BSN, [facility] Navigator [phone]

## 2013-01-30 ENCOUNTER — Ambulatory Visit
Admission: RE | Admit: 2013-01-30 | Discharge: 2013-01-30 | Disposition: A | Discharge status: Home / Self Care | Payer: Medicaid Other | Source: Ambulatory Visit | Attending: Radiation Oncology | Admitting: Radiation Oncology

## 2013-01-30 ENCOUNTER — Encounter: Payer: Self-pay | Admitting: Radiation Oncology

## 2013-01-30 ENCOUNTER — Encounter: Payer: Self-pay | Admitting: *Deleted

## 2013-01-30 VITALS — BP 112/71 | HR 80 | Resp 16 | Wt 120.5 lb

## 2013-01-30 DIAGNOSIS — C023 Malignant neoplasm of anterior two-thirds of tongue, part unspecified: Secondary | ICD-10-CM

## 2013-01-30 NOTE — Progress Notes (Signed)
Called [facility] to request a $50.00 gas card for patient.  LVM with request which is standard procedure.  Gayleen Orem, RN, BSN, [facility] Navigator [phone]

## 2013-01-30 NOTE — Progress Notes (Signed)
Jason     Rexene Strong, M.D. [place], [place] [address]               Jason Strong, M.D., Ph.D. Phone: [phone]      Jason Strong, M.D. Fax: [phone]      Jason Strong, M.D., Ph.D.         Jason Strong, M.D.         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  Current Dose: 24.2 Gy  Current Fraction: 11  Planned Dose: 66 Gy  Narrative: Jason Strong was seen today for weekly treatment management. The chart was checked and MVCT  were reviewed. He continues to tolerate the treatments well. He denies any fatigue breathing issues or swallowing difficulties.  Bee venom  Current Outpatient Prescriptions  Medication Sig Dispense Refill  . acetaminophen (TYLENOL) 160 MG/5ML solution 650 mg.      . albuterol (PROVENTIL) (2.5 MG/3ML) 0.083% nebulizer solution 2.5 mg.      . clonazePAM (KLONOPIN) 0.5 MG tablet Take 1 tablet (0.5 mg total) by mouth 2 (two) times daily as needed for anxiety.  60 tablet  1  . emollient (BIAFINE) cream Apply topically 2 (two) times daily.      . fentaNYL (DURAGESIC - DOSED MCG/HR) 25 MCG/HR patch 1 patch.      . ferrous sulfate 300 (60 FE) MG/5ML syrup Take 5 mLs (300 mg total) by mouth 2 (two) times daily.  194 mL  3  . folic acid (FOLVITE) 1 MG tablet Take 1 tablet (1 mg total) by mouth daily.  30 tablet  11  . multivitamins-fortified A-D-K (SOURCECF) solution Take 1 mL by mouth daily. 1 mL by Per G Tube route daily.  60 mL  11  . ondansetron (ZOFRAN) 4 MG tablet Take 1 tablet (4 mg total) by mouth every 8 (eight) hours as needed for nausea.  20 tablet  3  . oxyCODONE (OXY  IR/ROXICODONE) 5 MG immediate release tablet Take 1-2 pills as needed for pain every 6 hours  90 tablet  0  . oxyCODONE-acetaminophen (PERCOCET/ROXICET) 5-325 MG per tablet Take 1 tablet by mouth every 4 (four) hours as needed.  90 tablet  0  . QUEtiapine (SEROQUEL) 25 MG tablet Take 1 tablet (25 mg total) by mouth 2 (two) times daily.  60 tablet  2  . warfarin (COUMADIN) 5 MG tablet Take 1 tablet (5 mg total) by mouth daily.  30 tablet  3   No current facility-administered medications for this encounter.     Wt Readings from Last 3 Encounters:  01/30/13 120 lb 8 oz (54.658 kg)  01/22/13 118 lb 8 oz (53.751 kg)  01/15/13 117 lb 3.2 oz (53.162 kg)    The left neck area shows mild erythema. The lymph node slightly decreased in size by palpation Lungs - Normal respiratory effort, chest expands symmetrically. Lungs are clear to auscultation, no crackles or wheezes.  Heart has regular rhythm and rate  Abdomen is soft and non tender with normal bowel sounds. Feeding  tube in place.  Assessment:  Patient tolerating treatments well  Plan: Continue treatment per original radiation prescription

## 2013-01-30 NOTE — Progress Notes (Signed)
Patient reports instilling one can of osmolite every four hours into his feeding tube. Patient has gained 2 lb since last week. Hyperpigmentation with dry desquamation of left anterior neck noted without breaks in the skin. Reports using biafine bid as directed. Denies nausea, vomiting, headache or dizziness. Denies difficulty swallowing what little he tries to eat. Denies diarrhea.

## 2013-01-31 ENCOUNTER — Ambulatory Visit
Admission: RE | Admit: 2013-01-31 | Discharge: 2013-01-31 | Disposition: A | Discharge status: Home / Self Care | Payer: Medicaid Other | Source: Ambulatory Visit | Attending: Radiation Oncology | Admitting: Radiation Oncology

## 2013-02-01 ENCOUNTER — Ambulatory Visit
Admission: RE | Admit: 2013-02-01 | Discharge: 2013-02-01 | Disposition: A | Discharge status: Home / Self Care | Payer: Medicaid Other | Source: Ambulatory Visit | Attending: Radiation Oncology | Admitting: Radiation Oncology

## 2013-02-03 ENCOUNTER — Ambulatory Visit: Payer: Medicaid Other

## 2013-02-04 ENCOUNTER — Ambulatory Visit: Payer: Medicaid Other

## 2013-02-04 ENCOUNTER — Ambulatory Visit
Admission: RE | Admit: 2013-02-04 | Discharge: 2013-02-04 | Disposition: A | Discharge status: Home / Self Care | Payer: Medicaid Other | Source: Ambulatory Visit | Attending: Radiation Oncology | Admitting: Radiation Oncology

## 2013-02-05 ENCOUNTER — Encounter: Payer: Self-pay | Admitting: Radiation Oncology

## 2013-02-05 ENCOUNTER — Ambulatory Visit: Payer: Medicaid Other

## 2013-02-05 ENCOUNTER — Ambulatory Visit
Admission: RE | Admit: 2013-02-05 | Discharge: 2013-02-05 | Disposition: A | Discharge status: Home / Self Care | Payer: Medicaid Other | Source: Ambulatory Visit | Attending: Radiation Oncology | Admitting: Radiation Oncology

## 2013-02-05 VITALS — BP 121/84 | HR 81 | Resp 16

## 2013-02-05 DIAGNOSIS — C023 Malignant neoplasm of anterior two-thirds of tongue, part unspecified: Secondary | ICD-10-CM

## 2013-02-05 MED ORDER — OXYCODONE HCL 5 MG PO TABS: ORAL_TABLET | ORAL | Status: DC

## 2013-02-05 NOTE — Progress Notes (Signed)
Reports pain remains at [place] with medication regimen. Reports he was wobbly getting off treatment table because he attempt to first stand with his left leg instead of his right. Patient reports instilling one can of osmolite every four hours into his feeding tube. Hyperpigmentation with dry desquamation of left anterior neck noted without breaks in the skin. Reports using biafine bid as directed. Denies nausea, vomiting, headache or dizziness. Denies difficulty swallowing what little he tries to eat. Denies diarrhea.

## 2013-02-05 NOTE — Progress Notes (Signed)
[facility]     Rexene Edison, M.D. [place], [place] [address]               Blair Promise, M.D., Ph.D. Phone: [phone]      Rodman Key A. Tammi Klippel, M.D. Fax: [phone]      Jodelle Gross, M.D., Ph.D.         Thea Silversmith, M.D.         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  Current Dose: 33 Gy  Current Fraction: 15  Planned Dose: 66 Gy  Narrative: Jason Strong was seen today for weekly treatment management. The chart was checked and MVCT  were reviewed. He is tolerating his treatments well at this time. He has good control of his pain with his oxycodone. He is having some mild fatigue. He denies any itching along the left neck,  breathing problems or swallowing difficulties. I did refill his oxycodone today.  Bee venom  Current Outpatient Prescriptions  Medication Sig Dispense Refill  . acetaminophen (TYLENOL) 160 MG/5ML solution 650 mg.      . albuterol (PROVENTIL) (2.5 MG/3ML) 0.083% nebulizer solution 2.5 mg.      . clonazePAM (KLONOPIN) 0.5 MG tablet Take 1 tablet (0.5 mg total) by mouth 2 (two) times daily as needed for anxiety.  60 tablet  1  . emollient (BIAFINE) cream Apply topically 2 (two) times daily.      . fentaNYL (DURAGESIC - DOSED MCG/HR) 25 MCG/HR patch 1 patch.      . ferrous sulfate 300 (60 FE) MG/5ML syrup Take 5 mLs (300 mg total) by mouth 2 (two) times daily.  242 mL  3  . folic acid (FOLVITE) 1 MG tablet Take 1 tablet (1 mg total) by mouth daily.  30 tablet  11  . multivitamins-fortified A-D-K (SOURCECF) solution Take 1 mL by mouth daily. 1 mL by Per G Tube route daily.  60 mL  11  . ondansetron  (ZOFRAN) 4 MG tablet Take 1 tablet (4 mg total) by mouth every 8 (eight) hours as needed for nausea.  20 tablet  3  . oxyCODONE (OXY IR/ROXICODONE) 5 MG immediate release tablet Take 1-2 pills as needed for pain every 6 hours  90 tablet  0  . oxyCODONE-acetaminophen (PERCOCET/ROXICET) 5-325 MG per tablet Take 1 tablet by mouth every 4 (four) hours as needed.  90 tablet  0  . QUEtiapine (SEROQUEL) 25 MG tablet Take 1 tablet (25 mg total) by mouth 2 (two) times daily.  60 tablet  2  . warfarin (COUMADIN) 5 MG tablet Take 1 tablet (5 mg total) by mouth daily.  30 tablet  3   No current facility-administered medications for this encounter.   Labs:  Lab Results  Component Value Date   WBC 14.4* 11/12/2012   HGB 12.6 11/12/2012   HCT 37.7 11/12/2012   MCV 100* 11/12/2012   PLT 475* 11/12/2012   Lab Results  Component Value Date   CREATININE 0.8 01/03/2013   BUN 9.4 01/03/2013   NA 137 06/20/2012  K 3.6 06/20/2012   CL 103 06/20/2012   CO2 24 06/20/2012   Lab Results  Component Value Date   ALT 11 06/20/2012   AST 17 06/20/2012   PHOS 3.5 02/21/2012   BILITOT 0.3 06/20/2012    Physical Examination:  Filed Vitals:   02/05/13 1832  BP: 121/84  Pulse: 81  Resp: 16    Wt Readings from Last 3 Encounters:  01/30/13 120 lb 8 oz (54.658 kg)  01/22/13 118 lb 8 oz (53.751 kg)  01/15/13 117 lb 3.2 oz (53.162 kg)    The left neck node has decreased some in size. It is also softer on exam. There is erythema along the left neck but no skin breakdown. Lungs - Normal respiratory effort, chest expands symmetrically. Lungs are clear to auscultation, no crackles or wheezes.  Heart has regular rhythm and rate  Abdomen is soft and non tender with normal bowel sounds  Assessment:  Patient tolerating treatments well  Plan: Continue treatment per original radiation prescription

## 2013-02-06 ENCOUNTER — Ambulatory Visit: Payer: Medicaid Other

## 2013-02-06 ENCOUNTER — Ambulatory Visit
Admission: RE | Admit: 2013-02-06 | Discharge: 2013-02-06 | Disposition: A | Discharge status: Home / Self Care | Payer: Medicaid Other | Source: Ambulatory Visit | Attending: Radiation Oncology | Admitting: Radiation Oncology

## 2013-02-07 ENCOUNTER — Other Ambulatory Visit: Payer: Self-pay | Admitting: Family Medicine

## 2013-02-07 ENCOUNTER — Ambulatory Visit: Payer: Medicaid Other

## 2013-02-07 ENCOUNTER — Ambulatory Visit
Admission: RE | Admit: 2013-02-07 | Discharge: 2013-02-07 | Disposition: A | Discharge status: Home / Self Care | Payer: Medicaid Other | Source: Ambulatory Visit | Attending: Radiation Oncology | Admitting: Radiation Oncology

## 2013-02-08 ENCOUNTER — Ambulatory Visit: Payer: Medicaid Other

## 2013-02-08 ENCOUNTER — Ambulatory Visit
Admission: RE | Admit: 2013-02-08 | Discharge: 2013-02-08 | Disposition: A | Discharge status: Home / Self Care | Payer: Medicaid Other | Source: Ambulatory Visit | Attending: Radiation Oncology | Admitting: Radiation Oncology

## 2013-02-08 ENCOUNTER — Telehealth: Payer: Self-pay | Admitting: *Deleted

## 2013-02-08 MED ORDER — WARFARIN SODIUM 3 MG PO TABS
3.0000 mg | ORAL_TABLET | ORAL | Status: DC
Start: 2013-02-08 — End: 2013-04-24

## 2013-02-08 NOTE — Telephone Encounter (Signed)
Per Luna Kitchens from [facility] called to report pt has no 3 mg coumadin tablet  rx sent in for 3mg  tablets per order below ( Dr Jacelyn Grip )

## 2013-02-08 NOTE — Telephone Encounter (Signed)
Cheron Every to increase to 3 mg on Mondays and Fridays. 5 mg all other days. Recheck the PT in 1 week. Patient is thin and weak. And in radiation therapy for cancer.  Daryan Cagley P. Jacelyn Grip, M.D.

## 2013-02-08 NOTE — Telephone Encounter (Signed)
INR 1.9 Takes 2.5 mg on Mon and Fri. Takes 5mg  all other days. Call Abigail Butts to give new orders [phone]

## 2013-02-09 ENCOUNTER — Ambulatory Visit: Payer: Medicaid Other

## 2013-02-10 ENCOUNTER — Ambulatory Visit: Payer: Medicaid Other

## 2013-02-11 ENCOUNTER — Ambulatory Visit
Admission: RE | Admit: 2013-02-11 | Discharge: 2013-02-11 | Disposition: A | Discharge status: Home / Self Care | Payer: Medicaid Other | Source: Ambulatory Visit | Attending: Radiation Oncology | Admitting: Radiation Oncology

## 2013-02-11 ENCOUNTER — Ambulatory Visit: Payer: Medicaid Other

## 2013-02-12 ENCOUNTER — Ambulatory Visit
Admission: RE | Admit: 2013-02-12 | Discharge: 2013-02-12 | Disposition: A | Discharge status: Home / Self Care | Payer: Medicaid Other | Source: Ambulatory Visit | Attending: Radiation Oncology | Admitting: Radiation Oncology

## 2013-02-12 ENCOUNTER — Ambulatory Visit: Payer: Medicaid Other

## 2013-02-12 ENCOUNTER — Encounter: Payer: Self-pay | Admitting: Radiation Oncology

## 2013-02-12 ENCOUNTER — Encounter: Payer: Self-pay | Admitting: *Deleted

## 2013-02-12 VITALS — BP 109/72 | HR 86 | Temp 97.3°F | Resp 20 | Wt 121.7 lb

## 2013-02-12 DIAGNOSIS — C023 Malignant neoplasm of anterior two-thirds of tongue, part unspecified: Secondary | ICD-10-CM

## 2013-02-12 MED ORDER — BIAFINE EX EMUL
CUTANEOUS | Status: DC | PRN
  Administered 2013-02-12: 16:00:00 via TOPICAL

## 2013-02-12 NOTE — Progress Notes (Signed)
[place]     Rexene Edison, M.D. [place], [place] [address]               Blair Promise, M.D., Ph.D. Phone: [phone]      Rodman Key A. Tammi Klippel, M.D. Fax: [phone]      Jodelle Gross, M.D., Ph.D.         Thea Silversmith, M.D.         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  Current Dose: 41.8 Gy  Current Fraction: 19  Planned Dose: 66 Gy  Narrative: Jason Strong was seen today for weekly treatment management. The chart was checked and MVCT  were reviewed. He continues to tolerate her treatments well. He denies any pain with swallowing or voice difficulties. his skin in the treatment area is not bothering him.  Bee venom  Current Outpatient Prescriptions  Medication Sig Dispense Refill  . acetaminophen (TYLENOL) 160 MG/5ML solution 650 mg.      . albuterol (PROVENTIL) (2.5 MG/3ML) 0.083% nebulizer solution 2.5 mg.      . clonazePAM (KLONOPIN) 0.5 MG tablet Take 1 tablet (0.5 mg total) by mouth 2 (two) times daily as needed for anxiety.  60 tablet  1  . emollient (BIAFINE) cream Apply topically 2 (two) times daily.      . ferrous sulfate 300 (60 FE) MG/5ML syrup Take 5 mLs (300 mg total) by mouth 2 (two) times daily.  650 mL  3  . folic acid (FOLVITE) 1 MG tablet Take 1 tablet (1 mg total) by mouth daily.  30 tablet  11  . multivitamins-fortified A-D-K (SOURCECF) solution Take 1 mL by mouth daily. 1 mL by Per G Tube route daily.  60 mL  11  . ondansetron (ZOFRAN) 4 MG tablet Take 1 tablet (4 mg total) by mouth every 8 (eight) hours as needed for nausea.  20 tablet  3  . oxyCODONE (OXY IR/ROXICODONE) 5 MG immediate release  tablet Take 1-2 pills as needed for pain every 6 hours  90 tablet  0  . QUEtiapine (SEROQUEL) 25 MG tablet TAKE 1 TABLET BY MOUTH TWICE DAILY  60 tablet  0  . warfarin (COUMADIN) 3 MG tablet Take 1 tablet (3 mg total) by mouth 2 (two) times a week. Take 3 mg tablet on Mondays and Fridays  30 tablet  0  . warfarin (COUMADIN) 5 MG tablet Take 1 tablet (5 mg total) by mouth daily.  30 tablet  3  . fentaNYL (DURAGESIC - DOSED MCG/HR) 25 MCG/HR patch 1 patch.       Current Facility-Administered Medications  Medication Dose Route Frequency Provider Last Rate Last Dose  . topical emolient (BIAFINE) emulsion   Topical PRN Blair Promise, MD        Physical Examination:  Filed Vitals:   02/12/13 1621  BP: 109/72  Pulse: 86  Temp: 97.3 F (36.3 C)  Resp: 20    Wt Readings from Last 3 Encounters:  02/12/13 121 lb 11.2 oz (55.203 kg)  01/30/13 120 lb 8 oz (54.658  kg)  01/22/13 118 lb 8 oz (53.751 kg)    The skin of the treatment area shows some erythema and hyperpigmentation changes. No moist desquamation. The lymph node in the left neck has decreased in size since start of treatment Lungs - Normal respiratory effort, chest expands symmetrically. Lungs are clear to auscultation, no crackles or wheezes.  Heart has regular rhythm and rate  Abdomen is soft and non tender with normal bowel sounds  Assessment:  Patient tolerating treatments well  Plan: Continue treatment per original radiation prescription

## 2013-02-12 NOTE — Progress Notes (Addendum)
weekly rad txs, 19/30 completed,  Left neck,  Erythema, dry skin, but intact, no c/o pain, 1 can osmolite every 4 hours via peg, flush before and after with free water flushes, still eating pureed food in the evenings, fatigued, in w/c,  98% room air, using biafine cream bid, gave 2nd tube per pt request 4:21 PM

## 2013-02-12 NOTE — Progress Notes (Signed)
Met with patient and his caregiver during UT appt with Dr. Earney Hamburg to provide support and care continuity.  Patient indicated mouth is becoming drier and saliva thicker,  I encourage him to use salt/baking soda rinse to help with saliva, he indicated understanding.  Otherwise, patient did not express any concerns/complaints.  Continuing to navigate as L2 (treatments established) patient.  Gayleen Orem, RN, BSN, [facility] Navigator [phone]

## 2013-02-13 ENCOUNTER — Ambulatory Visit
Admission: RE | Admit: 2013-02-13 | Discharge: 2013-02-13 | Disposition: A | Discharge status: Home / Self Care | Payer: Medicaid Other | Source: Ambulatory Visit | Attending: Radiation Oncology | Admitting: Radiation Oncology

## 2013-02-13 ENCOUNTER — Ambulatory Visit: Payer: Medicaid Other

## 2013-02-14 ENCOUNTER — Ambulatory Visit
Admission: RE | Admit: 2013-02-14 | Discharge: 2013-02-14 | Disposition: A | Discharge status: Home / Self Care | Payer: Medicaid Other | Source: Ambulatory Visit | Attending: Radiation Oncology | Admitting: Radiation Oncology

## 2013-02-14 ENCOUNTER — Ambulatory Visit: Payer: Medicaid Other

## 2013-02-15 ENCOUNTER — Ambulatory Visit: Payer: Medicaid Other

## 2013-02-15 ENCOUNTER — Ambulatory Visit
Admission: RE | Admit: 2013-02-15 | Discharge: 2013-02-15 | Disposition: A | Discharge status: Home / Self Care | Payer: Medicaid Other | Source: Ambulatory Visit | Attending: Radiation Oncology | Admitting: Radiation Oncology

## 2013-02-18 ENCOUNTER — Ambulatory Visit
Admission: RE | Admit: 2013-02-18 | Discharge: 2013-02-18 | Disposition: A | Discharge status: Home / Self Care | Payer: Medicaid Other | Source: Ambulatory Visit | Attending: Radiation Oncology | Admitting: Radiation Oncology

## 2013-02-19 ENCOUNTER — Ambulatory Visit
Admission: RE | Admit: 2013-02-19 | Discharge: 2013-02-19 | Disposition: A | Discharge status: Home / Self Care | Payer: Medicaid Other | Source: Ambulatory Visit | Attending: Radiation Oncology | Admitting: Radiation Oncology

## 2013-02-19 VITALS — BP 116/68 | HR 81 | Temp 98.7°F | Ht 67.0 in | Wt 129.6 lb

## 2013-02-19 DIAGNOSIS — C023 Malignant neoplasm of anterior two-thirds of tongue, part unspecified: Secondary | ICD-10-CM

## 2013-02-19 NOTE — Progress Notes (Signed)
Jason Strong has had 25 fractions to his left neck.  He denies pain today.  He reports a dry mouth.  He has a white coating by his bottom lip.  He reports that he is able to eat pureed foods.  He is also taking in 1 can of Osmolite every 4 hours into his peg tube.  He has gained 8 lbs since last week.  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  The skin on his neck is red and peeling over his incision area.  He is using biafine twice a day.

## 2013-02-19 NOTE — Progress Notes (Signed)
[place]     Rexene Edison, M.D. [place], [place] [address]               Blair Promise, M.D., Ph.D. Phone: [phone]      Rodman Key A. Tammi Klippel, M.D. Fax: [phone]      Jodelle Gross, M.D., Ph.D.         Thea Silversmith, M.D.         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  Current Dose: 55 GY  Current Fraction: 25  Planned Dose: 66 Gy  Narrative: [name] was seen today for weekly treatment management. The chart was checked and MVCT  were reviewed. He continues to tolerate his treatments well without any significant fatigue itching or discomfort in the neck area. He denies any breathing problems or swallowing difficulties.  Bee venom Current Outpatient Prescriptions  Medication Sig Dispense Refill  . acetaminophen (TYLENOL) 160 MG/5ML solution 650 mg.      . albuterol (PROVENTIL) (2.5 MG/3ML) 0.083% nebulizer solution 2.5 mg.      . clonazePAM (KLONOPIN) 0.5 MG tablet Take 1 tablet (0.5 mg total) by mouth 2 (two) times daily as needed for anxiety.  60 tablet  1  . emollient (BIAFINE) cream Apply topically 2 (two) times daily.      . fentaNYL (DURAGESIC - DOSED MCG/HR) 25 MCG/HR patch 1 patch.      . ferrous sulfate 300 (60 FE) MG/5ML syrup Take 5 mLs (300 mg total) by mouth 2 (two) times daily.  166 mL  3  . folic acid (FOLVITE) 1 MG tablet Take 1 tablet (1 mg total) by mouth daily.  30 tablet  11  . multivitamins-fortified A-D-K (SOURCECF) solution Take 1 mL by mouth daily. 1 mL by Per G Tube route daily.  60 mL  11  . ondansetron (ZOFRAN) 4 MG tablet Take 1 tablet (4 mg total) by mouth every 8 (eight) hours as  needed for nausea.  20 tablet  3  . oxyCODONE (OXY IR/ROXICODONE) 5 MG immediate release tablet Take 1-2 pills as needed for pain every 6 hours  90 tablet  0  . QUEtiapine (SEROQUEL) 25 MG tablet TAKE 1 TABLET BY MOUTH TWICE DAILY  60 tablet  0  . warfarin (COUMADIN) 3 MG tablet Take 1 tablet (3 mg total) by mouth 2 (two) times a week. Take 3 mg tablet on Mondays and Fridays  30 tablet  0  . warfarin (COUMADIN) 5 MG tablet Take 1 tablet (5 mg total) by mouth daily.  30 tablet  3   No current facility-administered medications for this encounter.   Labs:  Lab Results  Component Value Date   WBC 14.4* 11/12/2012   HGB 12.6 11/12/2012   HCT 37.7 11/12/2012   MCV 100* 11/12/2012   PLT 475* 11/12/2012   Lab Results  Component Value Date   CREATININE 0.8 01/03/2013   BUN 9.4 01/03/2013   NA 137 06/20/2012   K 3.6 06/20/2012   CL 103 06/20/2012   CO2 24 06/20/2012   Lab  Results  Component Value Date   ALT 11 06/20/2012   AST 17 06/20/2012   PHOS 3.5 02/21/2012   BILITOT 0.3 06/20/2012    Physical Examination:  Filed Vitals:   02/19/13 1804  BP: 116/68  Pulse: 81  Temp: 98.7 F (37.1 C)    Wt Readings from Last 3 Encounters:  02/19/13 129 lb 9.6 oz (58.786 kg)  02/12/13 121 lb 11.2 oz (55.203 kg)  01/30/13 120 lb 8 oz (54.658 kg)    The left neck mass is regressing nicely at this point. Erythema and hyperpigmentation changes noted in the skin but no skin breakdown Lungs - Normal respiratory effort, chest expands symmetrically. Lungs are clear to auscultation, no crackles or wheezes.  Heart has regular rhythm and rate  Abdomen is soft and non tender with normal bowel sounds  Assessment:  Patient tolerating treatments well  Plan: Continue treatment per original radiation prescription

## 2013-02-20 ENCOUNTER — Ambulatory Visit
Admission: RE | Admit: 2013-02-20 | Discharge: 2013-02-20 | Disposition: A | Discharge status: Home / Self Care | Payer: Medicaid Other | Source: Ambulatory Visit | Attending: Radiation Oncology | Admitting: Radiation Oncology

## 2013-02-21 ENCOUNTER — Ambulatory Visit
Admission: RE | Admit: 2013-02-21 | Discharge: 2013-02-21 | Disposition: A | Discharge status: Home / Self Care | Payer: Medicaid Other | Source: Ambulatory Visit | Attending: Radiation Oncology | Admitting: Radiation Oncology

## 2013-02-22 ENCOUNTER — Ambulatory Visit
Admission: RE | Admit: 2013-02-22 | Discharge: 2013-02-22 | Disposition: A | Discharge status: Home / Self Care | Payer: Medicaid Other | Source: Ambulatory Visit | Attending: Radiation Oncology | Admitting: Radiation Oncology

## 2013-02-23 DIAGNOSIS — C021 Malignant neoplasm of border of tongue: Secondary | ICD-10-CM

## 2013-02-23 DIAGNOSIS — Z931 Gastrostomy status: Secondary | ICD-10-CM

## 2013-02-23 DIAGNOSIS — J9500 Unspecified tracheostomy complication: Secondary | ICD-10-CM

## 2013-02-25 ENCOUNTER — Ambulatory Visit
Admission: RE | Admit: 2013-02-25 | Discharge: 2013-02-25 | Disposition: A | Discharge status: Home / Self Care | Payer: Medicaid Other | Source: Ambulatory Visit | Attending: Radiation Oncology | Admitting: Radiation Oncology

## 2013-02-26 ENCOUNTER — Ambulatory Visit
Admission: RE | Admit: 2013-02-26 | Discharge: 2013-02-26 | Disposition: A | Discharge status: Home / Self Care | Payer: Medicaid Other | Source: Ambulatory Visit | Attending: Radiation Oncology | Admitting: Radiation Oncology

## 2013-02-26 ENCOUNTER — Encounter: Payer: Self-pay | Admitting: Radiation Oncology

## 2013-02-26 ENCOUNTER — Encounter: Payer: Self-pay | Admitting: *Deleted

## 2013-02-26 VITALS — BP 121/82 | HR 76 | Temp 98.2°F | Ht 67.0 in | Wt 124.8 lb

## 2013-02-26 DIAGNOSIS — C023 Malignant neoplasm of anterior two-thirds of tongue, part unspecified: Secondary | ICD-10-CM

## 2013-02-26 MED ORDER — OXYCODONE HCL 5 MG PO TABS: ORAL_TABLET | ORAL | Status: DC

## 2013-02-26 MED ORDER — BIAFINE EX EMUL
CUTANEOUS | Status: DC | PRN
  Administered 2013-02-26: 18:00:00 via TOPICAL

## 2013-02-26 NOTE — Progress Notes (Signed)
Met with patient has his family during final RT and during UT appt with Dr. Sondra Come to provide support and care continuity.  Initiating L3 navigation (treatments completed) with this encounter.  Gayleen Orem, RN, BSN, [facility] Navigator [phone]

## 2013-02-26 NOTE — Progress Notes (Signed)
  Radiation Oncology         [phone] ________________________________  Name: Jason Strong MRN: [id number]  Date: 02/26/2013  DOB: [birth year]  Weekly Radiation Therapy Management  Current Dose: 66 Gy     Planned Dose:  66 Gy  Narrative . . . . . . . . The patient presents for routine under treatment assessment.                                   The patient is without complaint. He is happy to complete his radiation therapy today he denies any itching or discomfort along the left neck or drainage he does need a refill on his oxycodone which we approved today                                 Set-up films were reviewed.                                 The chart was checked. Physical Findings. . .  height is 5\' 7"  (1.702 m) and weight is 124 lb 12.8 oz (56.609 kg). His temperature is 98.2 F (36.8 C). His blood pressure is 121/82 and his pulse is 76. . Weight essentially stable.  The lymph node mass has regressed since exam last week.  there is erythema in the left neck but no skin breakdown. Impression . . . . . . . The patient is tolerating radiation. Plan . . . . . . . . . . . . Continue treatment as planned.  ________________________________  -----------------------------------  Blair Promise, PhD, MD

## 2013-02-26 NOTE — Progress Notes (Signed)
Jason Strong has completed treatment to his left neck.   His left neck is red with dryness, but the skin remains intact.  He denies any pain in this area.  His mouth is moist with whitish exudate along the left tongue. He has a peg tube with instillation of 1 can of Osmolite Q 4 hrs.  He is also eating soft foods such as stewed tomatoes and potatoes and denies any pain or difficulty swallowing.

## 2013-02-26 NOTE — Progress Notes (Signed)
  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   His left neck mass regressed nicely. He did not experience any skin breakdown on the last day of treatment.  Plan: The patient has completed radiation treatment. The patient will return to radiation oncology clinic for routine followup in one month. I advised them to call or return sooner if they have any questions or concerns related to their recovery or treatment.  -----------------------------------  Blair Promise, PhD, MD

## 2013-03-07 ENCOUNTER — Telehealth: Payer: Self-pay | Admitting: *Deleted

## 2013-03-07 NOTE — Telephone Encounter (Signed)
Called patient s/p final RT on 02/26/13, spoke with his caretaker brother.  He stated Alois is eating well, not having any symptoms of concern.  .  Pt having occasional HAs but they resolve with Oxycodone.  Encouraged them to call me should needs arise; he indicated understanding.  Continuing to navigate as L3 (treatments completed) patient.  Gayleen Orem, RN, BSN, [facility] Neck Oncology Navigator [phone]

## 2013-03-18 ENCOUNTER — Other Ambulatory Visit: Payer: Self-pay | Admitting: Family Medicine

## 2013-03-19 ENCOUNTER — Telehealth: Payer: Self-pay | Admitting: Oncology

## 2013-03-19 ENCOUNTER — Other Ambulatory Visit: Payer: Self-pay | Admitting: Radiation Oncology

## 2013-03-19 MED ORDER — OXYCODONE HCL 5 MG PO TABS: ORAL_TABLET | ORAL | Status: DC

## 2013-03-19 NOTE — Telephone Encounter (Signed)
Called and let [name] know that the refill for oxycodone is ready for pick up in the nursing station.

## 2013-03-20 ENCOUNTER — Telehealth: Payer: Self-pay | Admitting: Oncology

## 2013-03-20 NOTE — Telephone Encounter (Signed)
Jason Strong is having trouble getting his medications refilled from his PCP.  He needs refills on Seroquel, klonopin and warfarin.  He has been told he needs to come in for follow up but can't get an appointment for a week and a half.  Maudry Mayhew that I would call his PCP, Dr. Jacelyn Grip, in the morning.

## 2013-03-21 ENCOUNTER — Other Ambulatory Visit: Payer: Self-pay | Admitting: *Deleted

## 2013-03-22 ENCOUNTER — Telehealth: Payer: Self-pay | Admitting: Family Medicine

## 2013-03-22 ENCOUNTER — Telehealth: Payer: Self-pay | Admitting: Oncology

## 2013-03-22 ENCOUNTER — Other Ambulatory Visit: Payer: Self-pay | Admitting: Family Medicine

## 2013-03-22 MED ORDER — FERROUS SULFATE 300 (60 FE) MG/5ML PO SYRP: 300.0000 mg | ORAL_SOLUTION | Freq: Two times a day (BID) | ORAL | Status: DC

## 2013-03-22 MED ORDER — CLONAZEPAM 0.5 MG PO TABS: ORAL_TABLET | ORAL | Status: DC

## 2013-03-22 MED ORDER — CLONAZEPAM 0.5 MG PO TABS: 60.0000 mg | ORAL_TABLET | Freq: Two times a day (BID) | ORAL | Status: DC | PRN

## 2013-03-22 NOTE — Telephone Encounter (Signed)
Called Dr. Jodi Mourning office at [facility]  Talked to Alverda.  Asked that a nurse give Mr. Mccaskey a call regarding the refills needed for Seroquel, Klonopin, liquid iron and warfarin.  Lattie Haw will have a nurse call him.

## 2013-04-03 ENCOUNTER — Telehealth: Payer: Self-pay | Admitting: General Practice

## 2013-04-03 NOTE — Telephone Encounter (Signed)
Called received from Home health, INR was 5.8. Contacted patient's wife to inform patient should hold coumadin tonight, home health nurse ([facility]) would follow up tomorrow with further instructions. [name] (Patient's wife) verbalized understanding and in agreement.

## 2013-04-04 ENCOUNTER — Ambulatory Visit (INDEPENDENT_AMBULATORY_CARE_PROVIDER_SITE_OTHER): Payer: Medicaid Other | Admitting: Pharmacist

## 2013-04-04 ENCOUNTER — Telehealth: Payer: Self-pay | Admitting: Pharmacist

## 2013-04-04 DIAGNOSIS — I2699 Other pulmonary embolism without acute cor pulmonale: Secondary | ICD-10-CM

## 2013-04-04 LAB — POCT INR: INR: 5.8

## 2013-04-04 NOTE — Progress Notes (Signed)
No charge for labs or visit - INR check through home health

## 2013-04-04 NOTE — Telephone Encounter (Signed)
Called [facility] spoke with Elmyra Ricks, gave order from [place]. To reck. PT 04-09-13

## 2013-04-04 NOTE — Telephone Encounter (Signed)
Please see anticoagulation note regarding INR of 5.8 from [facility] home [facility].  INR was called to [facility] after hours on 04/03/13.  She instructed patient to hold warfarin on 04/03/13 and that anticoagulation clinic / clinical pharmacist would follow up 04/04/13 with further instructions.  Patient told to hold warfarin for 3 days (3/11 thru 3/13) and then restart at lower dose of 3mg  daily except 5mg  Mondays, Wednesdays and Fridays.  Recheck INR either 04/09/13 or 04/10/13.

## 2013-04-12 ENCOUNTER — Telehealth: Payer: Self-pay | Admitting: Oncology

## 2013-04-12 ENCOUNTER — Other Ambulatory Visit: Payer: Self-pay | Admitting: Radiation Oncology

## 2013-04-12 ENCOUNTER — Telehealth: Payer: Self-pay | Admitting: *Deleted

## 2013-04-12 MED ORDER — OXYCODONE HCL 5 MG PO TABS: ORAL_TABLET | ORAL | Status: DC

## 2013-04-12 NOTE — Telephone Encounter (Signed)
Caren Griffins called and said Clark is out of his pain medication oxyCODONE (OXY IR/ROXICODONE) 5 MG immediate release tablet.  She said he is having pain/stiffness in his left neck that is giving him headaches.  The oxycodone releives the pain.  She said he is does not have any left.  Oxycodone refilled by Dr. Lisbeth Renshaw.  Called Caren Griffins back to let her know that it is ready for pick up in the Radiation nursing station.

## 2013-04-12 NOTE — Telephone Encounter (Signed)
Abigail Butts called from [facility] with a protime result that was drawn on 04/09/13, per Tammy, I told her this was of no use to [place] since it had been 3 day, she will draw a stat PT/INR on 04/15/13. Pt aware to continue taking same as before

## 2013-04-16 ENCOUNTER — Telehealth: Payer: Self-pay | Admitting: Family Medicine

## 2013-04-17 NOTE — Telephone Encounter (Signed)
Pt is coming tomorrow at 11 am for protime

## 2013-04-18 ENCOUNTER — Telehealth: Payer: Self-pay | Admitting: Pharmacist

## 2013-04-18 ENCOUNTER — Ambulatory Visit (INDEPENDENT_AMBULATORY_CARE_PROVIDER_SITE_OTHER): Payer: Medicaid Other | Admitting: Pharmacist

## 2013-04-18 DIAGNOSIS — I2699 Other pulmonary embolism without acute cor pulmonale: Secondary | ICD-10-CM

## 2013-04-18 LAB — POCT INR: INR: 1.3

## 2013-04-18 NOTE — Progress Notes (Signed)
See anticoagulation notes - appt made to establish care with a PCP.

## 2013-04-18 NOTE — Patient Instructions (Signed)
Anticoagulation Dose Instructions as of 04/18/2013     Jason Strong Tue Wed Thu Fri Sat   New Dose 3 mg 5 mg 3 mg 5 mg 3 mg 5 mg 3 mg    Description       Take 5mg  tablet today and then start new dose of 3mg  daily except 5mg  Mondays, Wednesdays and Fridays      INR was 1.3 today (goal is 2.0 to 3.0)

## 2013-04-19 ENCOUNTER — Encounter: Payer: Self-pay | Admitting: Oncology

## 2013-04-22 ENCOUNTER — Other Ambulatory Visit: Payer: Self-pay | Admitting: Family Medicine

## 2013-04-22 MED ORDER — CLONAZEPAM 0.5 MG PO TABS: ORAL_TABLET | ORAL | Status: DC

## 2013-04-22 MED ORDER — QUETIAPINE FUMARATE 25 MG PO TABS: ORAL_TABLET | ORAL | Status: DC

## 2013-04-22 NOTE — Telephone Encounter (Signed)
Rx ready for nurse to Phone in. 

## 2013-04-22 NOTE — Telephone Encounter (Signed)
Left message on pt vm that rx for klonopin rx ready for pick up

## 2013-04-24 ENCOUNTER — Ambulatory Visit (INDEPENDENT_AMBULATORY_CARE_PROVIDER_SITE_OTHER): Payer: Medicaid Other | Admitting: Family Medicine

## 2013-04-24 ENCOUNTER — Ambulatory Visit: Payer: Self-pay | Admitting: Pharmacist

## 2013-04-24 ENCOUNTER — Encounter: Payer: Self-pay | Admitting: Family Medicine

## 2013-04-24 VITALS — BP 128/76 | HR 65 | Temp 96.9°F | Ht 67.0 in

## 2013-04-24 DIAGNOSIS — J069 Acute upper respiratory infection, unspecified: Secondary | ICD-10-CM

## 2013-04-24 DIAGNOSIS — I2699 Other pulmonary embolism without acute cor pulmonale: Secondary | ICD-10-CM

## 2013-04-24 DIAGNOSIS — G8190 Hemiplegia, unspecified affecting unspecified side: Secondary | ICD-10-CM

## 2013-04-24 DIAGNOSIS — I6350 Cerebral infarction due to unspecified occlusion or stenosis of unspecified cerebral artery: Secondary | ICD-10-CM

## 2013-04-24 DIAGNOSIS — R350 Frequency of micturition: Secondary | ICD-10-CM

## 2013-04-24 DIAGNOSIS — R5381 Other malaise: Secondary | ICD-10-CM

## 2013-04-24 DIAGNOSIS — R5383 Other fatigue: Secondary | ICD-10-CM

## 2013-04-24 DIAGNOSIS — G8194 Hemiplegia, unspecified affecting left nondominant side: Secondary | ICD-10-CM

## 2013-04-24 DIAGNOSIS — R739 Hyperglycemia, unspecified: Secondary | ICD-10-CM

## 2013-04-24 DIAGNOSIS — I639 Cerebral infarction, unspecified: Secondary | ICD-10-CM

## 2013-04-24 DIAGNOSIS — C760 Malignant neoplasm of head, face and neck: Secondary | ICD-10-CM

## 2013-04-24 DIAGNOSIS — E785 Hyperlipidemia, unspecified: Secondary | ICD-10-CM

## 2013-04-24 DIAGNOSIS — H109 Unspecified conjunctivitis: Secondary | ICD-10-CM

## 2013-04-24 DIAGNOSIS — R7309 Other abnormal glucose: Secondary | ICD-10-CM

## 2013-04-24 LAB — POCT GLYCOSYLATED HEMOGLOBIN (HGB A1C): Hemoglobin A1C: 5.4

## 2013-04-24 LAB — POCT CBC
Granulocyte percent: 82 %G — AB (ref 37–80)
HCT, POC: 43.8 % (ref 43.5–53.7)
Hemoglobin: 14.7 g/dL (ref 14.1–18.1)
Lymph, poc: 1.6 (ref 0.6–3.4)
MCH, POC: 33.1 pg — AB (ref 27–31.2)
MCHC: 33.6 g/dL (ref 31.8–35.4)
MCV: 98.4 fL — AB (ref 80–97)
MPV: 9 fL (ref 0–99.8)
POC Granulocyte: 8 — AB (ref 2–6.9)
POC LYMPH PERCENT: 16.1 %L (ref 10–50)
Platelet Count, POC: 269 10*3/uL (ref 142–424)
RBC: 4.5 M/uL — AB (ref 4.69–6.13)
RDW, POC: 13.2 %
WBC: 9.7 10*3/uL (ref 4.6–10.2)

## 2013-04-24 LAB — POCT INR: INR: 2.7

## 2013-04-24 MED ORDER — AZITHROMYCIN 250 MG PO TABS: ORAL_TABLET | ORAL | Status: DC

## 2013-04-24 MED ORDER — MONTELUKAST SODIUM 10 MG PO TABS: 10.0000 mg | ORAL_TABLET | Freq: Every day | ORAL | Status: DC

## 2013-04-24 MED ORDER — WARFARIN SODIUM 3 MG PO TABS: 3.0000 mg | ORAL_TABLET | Freq: Every day | ORAL | Status: DC

## 2013-04-24 MED ORDER — AMOXICILLIN 875 MG PO TABS: 875.0000 mg | ORAL_TABLET | Freq: Two times a day (BID) | ORAL | Status: DC

## 2013-04-24 MED ORDER — POLYMYXIN B-TRIMETHOPRIM 10000-0.1 UNIT/ML-% OP SOLN: 2.0000 [drp] | OPHTHALMIC | Status: DC

## 2013-04-24 NOTE — Patient Instructions (Addendum)
Anticoagulation Dose Instructions as of 04/24/2013     Jason Strong Tue Wed Thu Fri Sat   New Dose 3 mg 5 mg 3 mg 3 mg 3 mg 3 mg 3 mg    Description       Decrease of 3mg  daily except 5mg  Mondays.      INR was 2.7 today

## 2013-04-24 NOTE — Progress Notes (Signed)
Subjective:    Patient ID: Jason Strong, male    DOB: November 04, 1960, 53 y.o.   MRN: [id number]  HPI This 53 y.o. male presents for evaluation of routine follow up.  He is accompanied by his sister in law and lives with brother and sister in law who help take care of him.  He has recently in hospital 1/15 for CVA and was dx with head and neck Cancer.  He underwent head and neck surgery and had trach which was just removed and he has healing tracheostomy site.  He still has G tube and is still having To use this for meds and diet.  He is getting a can of osmolite every 4 hours bolus and he eats some pureed diet orally and he is swallowing fine according to family. He has hx of PE, HTN, GERD, and tobacco abuse.  He is still smoking occasionally he states.  He has home health services.   .   Review of Systems No chest pain, SOB, HA, dizziness, vision change, N/V, diarrhea, constipation, dysuria, urinary urgency or frequency, myalgias, arthralgias or rash.     Objective:   Physical Exam  Vital signs noted  Chronically ill appearing male in wheelchair.  HEENT - Head atraumatic Normocephalic                Eyes - PERRLA, Conjuctiva - injected Sclera- Clear EOMI                Ears - EAC's Wnl TM's Wnl Gross Hearing WNL                Nose - Nares patent                 Throat - oropharanx wnl, trach site healing well Respiratory - Lungs CTA bilateral Cardiac - RRR S1 and S2 without murmur GI - Abdomen soft Nontender and bowel sounds active x 4 Extremities - No edema. Neuro - Left hemiplegia  Results for orders placed in visit on 04/24/13  CMP14+EGFR      Result Value Ref Range   Glucose 108 (*) 65 - 99 mg/dL   BUN 7  6 - 24 mg/dL   Creatinine, Ser 0.76  0.76 - 1.27 mg/dL   GFR calc non Af Amer 105  >59 mL/min/1.73   GFR calc Af Amer 121  >59 mL/min/1.73   BUN/Creatinine Ratio 9  9 - 20   Sodium 141  134 - 144 mmol/L   Potassium 4.3  3.5 - 5.2 mmol/L   Chloride 98  97 - 108  mmol/L   CO2 24  18 - 29 mmol/L   Calcium 9.6  8.7 - 10.2 mg/dL   Total Protein 7.5  6.0 - 8.5 g/dL   Albumin 4.2  3.5 - 5.5 g/dL   Globulin, Total 3.3  1.5 - 4.5 g/dL   Albumin/Globulin Ratio 1.3  1.1 - 2.5   Total Bilirubin 0.3  0.0 - 1.2 mg/dL   Alkaline Phosphatase 79  39 - 117 IU/L   AST 18  0 - 40 IU/L   ALT 18  0 - 44 IU/L  VITAMIN D 25 HYDROXY      Result Value Ref Range   Vit D, 25-Hydroxy 31.5  30.0 - 100.0 ng/mL  VITAMIN B12      Result Value Ref Range   Vitamin B-12 632  211 - 946 pg/mL  TSH      Result Value Ref Range  TSH 1.330  0.450 - 4.500 uIU/mL  LIPID PANEL      Result Value Ref Range   Cholesterol, Total 139  100 - 199 mg/dL   Triglycerides 174 (*) 0 - 149 mg/dL   HDL 32 (*) >39 mg/dL   VLDL Cholesterol Cal 35  5 - 40 mg/dL   LDL Calculated 72  0 - 99 mg/dL   Chol/HDL Ratio 4.3  0.0 - 5.0 ratio units  PSA, TOTAL AND FREE      Result Value Ref Range   PSA 0.5  0.0 - 4.0 ng/mL   PSA, Free 0.14  N/A ng/mL   PSA, Free Pct 28.0    POCT CBC      Result Value Ref Range   WBC 9.7  4.6 - 10.2 K/uL   Lymph, poc 1.6  0.6 - 3.4   POC LYMPH PERCENT 16.1  10 - 50 %L   POC Granulocyte 8.0 (*) 2 - 6.9   Granulocyte percent 82.0 (*) 37 - 80 %G   RBC 4.5 (*) 4.69 - 6.13 M/uL   Hemoglobin 14.7  14.1 - 18.1 g/dL   HCT, POC 43.8  43.5 - 53.7 %   MCV 98.4 (*) 80 - 97 fL   MCH, POC 33.1 (*) 27 - 31.2 pg   MCHC 33.6  31.8 - 35.4 g/dL   RDW, POC 13.2     Platelet Count, POC 269.0  142 - 424 K/uL   MPV 9.0  0 - 99.8 fL  POCT INR      Result Value Ref Range   INR 2.7    POCT GLYCOSYLATED HEMOGLOBIN (HGB A1C)      Result Value Ref Range   Hemoglobin A1C 5.4%        Assessment & Plan:  URI (upper respiratory infection) - Plan: montelukast (SINGULAIR) 10 MG tablet, amoxicillin (AMOXIL) 875 MG tablet, POCT glycosylated hemoglobin (Hb A1C), DISCONTINUED: azithromycin (ZITHROMAX) 250 MG tablet  Conjunctivitis - Plan: trimethoprim-polymyxin b (POLYTRIM) ophthalmic  solution, POCT glycosylated hemoglobin (Hb A1C)  Left hemiparesis - Plan: Ambulatory referral to Physical Therapy, POCT glycosylated hemoglobin (Hb A1C)  Urine frequency - Plan: PSA, total and free, POCT glycosylated hemoglobin (Hb A1C)  Other malaise and fatigue - Plan: Ambulatory referral to Physical Therapy, POCT CBC, CMP14+EGFR, Vit D  25 hydroxy (rtn osteoporosis monitoring), Vitamin B12, TSH, Lipid panel, POCT glycosylated hemoglobin (Hb A1C)  Other and unspecified hyperlipidemia - Plan: Lipid panel, POCT glycosylated hemoglobin (Hb A1C)  CVA (cerebral infarction) - Plan: amoxicillin (AMOXIL) 875 MG tablet, POCT glycosylated hemoglobin (Hb A1C)  Head and neck cancer - Plan: amoxicillin (AMOXIL) 875 MG tablet, POCT glycosylated hemoglobin (Hb A1C)  Hyperglycemia - Plan: POCT glycosylated hemoglobin (Hb A1C), CANCELED: Hemoglobin A1c  Other pulmonary embolism and infarction - Plan: POCT INR, POCT glycosylated hemoglobin (Hb A1C)  Lysbeth Penner FNP

## 2013-04-25 ENCOUNTER — Ambulatory Visit: Admission: RE | Admit: 2013-04-25 | Payer: Medicaid Other | Source: Ambulatory Visit | Admitting: Radiation Oncology

## 2013-04-25 LAB — LIPID PANEL
Chol/HDL Ratio: 4.3 ratio units (ref 0.0–5.0)
Cholesterol, Total: 139 mg/dL (ref 100–199)
HDL: 32 mg/dL — ABNORMAL LOW (ref >39)
LDL Calculated: 72 mg/dL (ref 0–99)
Triglycerides: 174 mg/dL — ABNORMAL HIGH (ref 0–149)
VLDL Cholesterol Cal: 35 mg/dL (ref 5–40)

## 2013-04-25 LAB — CMP14+EGFR
ALT: 18 IU/L (ref 0–44)
AST: 18 IU/L (ref 0–40)
Albumin/Globulin Ratio: 1.3 (ref 1.1–2.5)
Albumin: 4.2 g/dL (ref 3.5–5.5)
Alkaline Phosphatase: 79 IU/L (ref 39–117)
BUN/Creatinine Ratio: 9 (ref 9–20)
BUN: 7 mg/dL (ref 6–24)
CO2: 24 mmol/L (ref 18–29)
Calcium: 9.6 mg/dL (ref 8.7–10.2)
Chloride: 98 mmol/L (ref 97–108)
Creatinine, Ser: 0.76 mg/dL (ref 0.76–1.27)
GFR calc Af Amer: 121 mL/min/{1.73_m2} (ref >59)
GFR calc non Af Amer: 105 mL/min/{1.73_m2} (ref >59)
Globulin, Total: 3.3 g/dL (ref 1.5–4.5)
Glucose: 108 mg/dL — ABNORMAL HIGH (ref 65–99)
Potassium: 4.3 mmol/L (ref 3.5–5.2)
Sodium: 141 mmol/L (ref 134–144)
Total Bilirubin: 0.3 mg/dL (ref 0.0–1.2)
Total Protein: 7.5 g/dL (ref 6.0–8.5)

## 2013-04-25 LAB — PSA, TOTAL AND FREE
PSA, Free Pct: 28 %
PSA, Free: 0.14 ng/mL
PSA: 0.5 ng/mL (ref 0.0–4.0)

## 2013-04-25 LAB — VITAMIN D 25 HYDROXY (VIT D DEFICIENCY, FRACTURES): Vit D, 25-Hydroxy: 31.5 ng/mL (ref 30.0–100.0)

## 2013-04-25 LAB — TSH: TSH: 1.33 u[IU]/mL (ref 0.450–4.500)

## 2013-04-25 LAB — VITAMIN B12: Vitamin B-12: 632 pg/mL (ref 211–946)

## 2013-05-09 ENCOUNTER — Ambulatory Visit
Admission: RE | Admit: 2013-05-09 | Discharge: 2013-05-09 | Disposition: A | Discharge status: Home / Self Care | Payer: Medicaid Other | Source: Ambulatory Visit | Attending: Radiation Oncology | Admitting: Radiation Oncology

## 2013-05-09 ENCOUNTER — Encounter: Payer: Self-pay | Admitting: Radiation Oncology

## 2013-05-09 VITALS — BP 131/86 | HR 99 | Temp 97.9°F | Ht 67.0 in | Wt 117.5 lb

## 2013-05-09 DIAGNOSIS — C021 Malignant neoplasm of border of tongue: Secondary | ICD-10-CM

## 2013-05-09 MED ORDER — OXYCODONE HCL 5 MG PO TABS: ORAL_TABLET | ORAL | Status: DC

## 2013-05-09 NOTE — Progress Notes (Signed)
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  Interval history is significant for the patient having his trach removed. Marland Kitchen He denies any significant pain along the left neck area. He takes 1-2 oxycodone per day. I did refill his pain medication today. He has had some irritation along his PEG site.                             ALLERGIES:  is allergic to bee venom.  Meds: Current Outpatient Prescriptions  Medication Sig Dispense Refill  . acetaminophen (TYLENOL) 160 MG/5ML solution 650 mg.      . albuterol (PROVENTIL) (2.5 MG/3ML) 0.083% nebulizer solution 2.5 mg.      . clonazePAM (KLONOPIN) 0.5 MG tablet One po bid prn anxiety  60 tablet  1  . ferrous sulfate 300 (60 FE) MG/5ML syrup Take 5 mLs (300 mg total) by mouth 2 (two) times daily.  150 mL  3  . montelukast (SINGULAIR) 10 MG tablet Take 1 tablet (10 mg total) by mouth at bedtime.  30 tablet  3  . multivitamins-fortified A-D-K (SOURCECF) solution Take 1 mL by mouth daily. 1 mL by Per G Tube route daily.  60 mL  11  . omeprazole (PRILOSEC OTC) 20 MG tablet Take 20 mg by mouth.      . ondansetron (ZOFRAN) 4 MG tablet Take 1 tablet (4 mg total) by mouth every 8 (eight) hours as needed for nausea.  20 tablet  3  . oxyCODONE (OXY IR/ROXICODONE) 5 MG immediate release tablet Take 1-2 pills as needed for pain every 6 hours  90 tablet  0  . QUEtiapine (SEROQUEL) 25 MG tablet TAKE 1 TABLET BY MOUTH TWICE DAILY  60 tablet  1  . trimethoprim-polymyxin b (POLYTRIM) ophthalmic solution Place 2 drops into both eyes every 4 (four) hours.  10 mL  0  . warfarin (COUMADIN) 3 MG tablet  Take 1 tablet (3 mg total) by mouth daily. Take 3 mg tablet on Mondays and Fridays  30 tablet  2  . emollient (BIAFINE) cream Apply topically 2 (two) times daily.      . folic acid (FOLVITE) 1 MG tablet Take 1 tablet (1 mg total) by mouth daily.  30 tablet  11   No current facility-administered medications for this encounter.    Physical Findings: The patient is in no acute distress. Patient is alert and oriented.  height is 5\' 7"  (1.702 m) and weight is 117 lb 8 oz (53.298 kg). His temperature is 97.9 F (36.6 C). His blood pressure is 120/75 and his pulse is 93. His oxygen saturation is 98%. .  The left neck mass has regressed nicely. There continues to be some thickening in this area. No skin breakdown or drainage noted. The PEG site shows some erythema without obvious infection.  The patient is a small opening in  anterior lower neck where his trach previously was located.  Lab Findings: Lab Results  Component Value Date   WBC 9.7 04/24/2013   HGB 14.7 04/24/2013   HCT  43.8 04/24/2013   MCV 98.4* 04/24/2013   PLT 475* 11/12/2012      Radiographic Findings: No results found.  Impression:  Favorable response to palliative radiation therapy for recurrent disease to the left neck  Plan:  Routine followup in 3 months.  ____________________________________ Blair Promise, MD

## 2013-05-09 NOTE — Addendum Note (Signed)
Encounter addended by: Jacqulyn Liner, RN on: 05/09/2013 11:59 AM<BR>     Documentation filed: Vitals Section

## 2013-05-09 NOTE — Progress Notes (Signed)
Staton Markey here for follow up after treatment to his left neck.  He denies pain but has occasional headaches.  He is asking for a refill on his oxycodone.  He has been taking about 2 a day.  He has lost 7 lbs since 02/26/13.  He is taking in about 4 cans of Osmolite per day.  Orthostatic vitals done bp sitting 120/75, hr 93, bp standing 131/86, hr 99.  He has pinkness/redness around his peg tube.  His sister in law reports seeing a small amount of bleeding occasionally.  He had his trach removed last week.  The site has is open.  He has some white spots on the back of his tongue.  The skin on his left neck is pink and intact.  He reports fatigue with activity.  He has weakness in his left arm and leg from a CVA in January.

## 2013-05-28 ENCOUNTER — Telehealth: Payer: Self-pay | Admitting: Dietician

## 2013-05-28 NOTE — Telephone Encounter (Signed)
Brief Outpatient Oncology Nutrition Note  Patient has been identified to be at risk on malnutrition screen.  Wt Readings from Last 10 Encounters:  05/09/13 117 lb 8 oz (53.298 kg)  02/26/13 124 lb 12.8 oz (56.609 kg)  02/19/13 129 lb 9.6 oz (58.786 kg)  02/12/13 121 lb 11.2 oz (55.203 kg)  01/30/13 120 lb 8 oz (54.658 kg)  01/22/13 118 lb 8 oz (53.751 kg)  01/15/13 117 lb 3.2 oz (53.162 kg)  01/03/13 117 lb 3.2 oz (53.162 kg)  10/31/12 111 lb (50.349 kg)  06/19/12 121 lb 11.1 oz (55.2 kg)      Dx:  Head and Neck Cancer  Called patient due to weight loss.  Patient recently admitted to the hospital with a CVA.  Continues to use PEG (Osmolite 1 can every 4 hours and eats small amounts of pureed diet per chart.)    Chart reviewed.  Last seen by [facility] about a year ago.  Diagnosed with malnutrition at that time.  Weighed 121 lbs at that time with a UBW of 140lbs in January of 2014.  Spoke with patient's brother.  Patient is currently at MD appointment at [facility].  Will follow up at a later date.  Antonieta Iba, RD, LDN

## 2013-05-29 ENCOUNTER — Telehealth: Payer: Self-pay | Admitting: Dietician

## 2013-05-29 NOTE — Telephone Encounter (Signed)
Patient is unavailable.  Will continue to try to contact regarding weight.  (on TF)  Antonieta Iba, RD, LDN

## 2013-05-31 ENCOUNTER — Telehealth: Payer: Self-pay | Admitting: Dietician

## 2013-05-31 NOTE — Telephone Encounter (Signed)
Continue to be unable to reach patient.  (pureed diet, PEG, weight loss.)  Message left with [facility] RD's contact information.  Antonieta Iba, RD, LDN

## 2013-06-03 ENCOUNTER — Telehealth: Payer: Self-pay | Admitting: Dietician

## 2013-06-03 NOTE — Telephone Encounter (Signed)
Continue to be unable to reach patient.  Message left with [place] RD contact information.  Antonieta Iba, RD, LDN

## 2013-06-04 ENCOUNTER — Telehealth: Payer: Self-pay | Admitting: Oncology

## 2013-06-04 ENCOUNTER — Other Ambulatory Visit: Payer: Self-pay | Admitting: Radiation Oncology

## 2013-06-04 MED ORDER — OXYCODONE HCL 5 MG PO TABS: ORAL_TABLET | ORAL | Status: DC

## 2013-06-04 NOTE — Telephone Encounter (Signed)
[name] called and said that Jason Strong needs a refill on his oxycodone 5 mg tablets.  He continues to have headaches and lower back pain.  She said he does not have any left.

## 2013-06-24 ENCOUNTER — Telehealth: Payer: Self-pay | Admitting: Family Medicine

## 2013-06-24 ENCOUNTER — Other Ambulatory Visit: Payer: Self-pay | Admitting: Family Medicine

## 2013-06-24 MED ORDER — QUETIAPINE FUMARATE 25 MG PO TABS: ORAL_TABLET | ORAL | Status: DC

## 2013-06-24 NOTE — Telephone Encounter (Signed)
rx sent to pharmacy

## 2013-06-27 ENCOUNTER — Telehealth: Payer: Self-pay | Admitting: Oncology

## 2013-06-27 ENCOUNTER — Other Ambulatory Visit: Payer: Self-pay | Admitting: Radiation Oncology

## 2013-06-27 MED ORDER — OXYCODONE HCL 5 MG PO TABS: ORAL_TABLET | ORAL | Status: DC

## 2013-06-27 NOTE — Telephone Encounter (Signed)
Jason Strong left a message asking for a refill on his oxycodone.  She said he had 2 talbets left.

## 2013-07-31 ENCOUNTER — Other Ambulatory Visit: Payer: Self-pay | Admitting: Radiation Oncology

## 2013-07-31 ENCOUNTER — Telehealth: Payer: Self-pay | Admitting: Oncology

## 2013-07-31 MED ORDER — OXYCODONE HCL 5 MG PO TABS: ORAL_TABLET | ORAL | Status: DC

## 2013-07-31 NOTE — Telephone Encounter (Signed)
Caren Griffins called and requested a refill for Jason Strong on oxycodone 5 mg.  She said that he is having headaches and also upper back pain.  She said the upper back pain started about a month ago.  She said he is taking 4-6 tablets per day and is taking 2-3 tablets at a time.  Advised her that the script is for 1-2 tablets.  His last script was filled on 06/27/13.  Also advised her that his next follow up with Dr. Sondra Come is on 08/15/13.  Caren Griffins verbalized agreement.  She also asked if Dr. Sondra Come can fill Jason Strong's other medications like coumadin. Advised her that his primary care doctor would need to fill them.

## 2013-08-01 ENCOUNTER — Telehealth: Payer: Self-pay | Admitting: Oncology

## 2013-08-01 NOTE — Telephone Encounter (Signed)
Called and let [name] know that Jason Strong's script for oxycodone is available for pick up in the Radiation Oncology nursing station.

## 2013-08-15 ENCOUNTER — Encounter: Payer: Self-pay | Admitting: Radiation Oncology

## 2013-08-15 ENCOUNTER — Ambulatory Visit
Admission: RE | Admit: 2013-08-15 | Discharge: 2013-08-15 | Disposition: A | Discharge status: Home / Self Care | Payer: Medicaid Other | Source: Ambulatory Visit | Attending: Radiation Oncology | Admitting: Radiation Oncology

## 2013-08-15 VITALS — BP 130/81 | HR 65 | Resp 16 | Wt 106.2 lb

## 2013-08-15 DIAGNOSIS — C023 Malignant neoplasm of anterior two-thirds of tongue, part unspecified: Secondary | ICD-10-CM

## 2013-08-15 MED ORDER — OXYCODONE HCL 5 MG PO TABS: 5.0000 mg | ORAL_TABLET | Freq: Four times a day (QID) | ORAL | Status: DC | PRN

## 2013-08-15 NOTE — Progress Notes (Signed)
  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  He is doing well and without complaints. Having less pain in the neck region. He takes his oxycodone approximately twice a day. He continues to follow up approximately every month at [facility]. Interval history is significant for having his trach removed, his stoma was sewn shut. According to family members he still gets some slight drainage from this area.   He is able to eat soft foods at this time.                           ALLERGIES:  is allergic to bee venom.  Meds: Current Outpatient Prescriptions  Medication Sig Dispense Refill  . oxyCODONE (OXY IR/ROXICODONE) 5 MG immediate release tablet Take 1 tablet (5 mg total) by mouth every 6 (six) hours as needed for severe pain.  90 tablet  0   No current facility-administered medications for this encounter.    Physical Findings: The patient is in no acute distress. Patient is alert and oriented.  weight is 106 lb 3.2 oz (48.172 kg). His blood pressure is 130/81 and his pulse is 65. His respiration is 16. .  The lungs are clear. The heart has regular rhythm and rate. No palpable adenopathy is noted in the neck or supraclavicular region. He has some induration in his prior lymph node mass was located in the left midneck. The oral cavity shows significant surgical changes related to his floor of mouth resection. No obvious infection.  Lab Findings: Lab Results  Component Value Date   WBC 9.7 04/24/2013   HGB 14.7 04/24/2013   HCT 43.8 04/24/2013   MCV 98.4* 04/24/2013   PLT 475* 11/12/2012      Radiographic  Findings: No results found.  Impression:  The patient has had a good response to his radiation directed at his left neck mass.  Plan:  When necessary followup in radiation oncology. Patient will continue close followup with his surgical team at [facility].  ____________________________________ Blair Promise, MD

## 2013-08-15 NOTE — Progress Notes (Signed)
Reports eating only soft foods twice per day when hungry. Reports drinking 2 cans of Boost per day. Ten lb weight loss noted since April. Denies pain. Left side remains weak from effects of stroke. Old trach site noted with two stitches in an attempt to close. Denies headache, dizziness, nausea, vomiting, or diarrhea.

## 2013-09-02 ENCOUNTER — Other Ambulatory Visit: Payer: Self-pay | Admitting: *Deleted

## 2013-09-02 NOTE — Telephone Encounter (Signed)
Last ov 04/24/13. Last refill 07/31/13.

## 2013-09-02 NOTE — Telephone Encounter (Signed)
Last seen 04/24/13  B [place]  This med is not on EPIC med list

## 2013-09-03 MED ORDER — MONTELUKAST SODIUM 10 MG PO TABS: 10.0000 mg | ORAL_TABLET | Freq: Every day | ORAL | Status: DC

## 2013-09-03 MED ORDER — WARFARIN SODIUM 3 MG PO TABS: 3.0000 mg | ORAL_TABLET | Freq: Every day | ORAL | Status: DC

## 2013-09-10 ENCOUNTER — Telehealth: Payer: Self-pay | Admitting: Family Medicine

## 2013-09-11 ENCOUNTER — Other Ambulatory Visit: Payer: Self-pay | Admitting: *Deleted

## 2013-09-17 NOTE — Telephone Encounter (Signed)
appt given for 9/1 with Dietrich Pates

## 2013-09-24 ENCOUNTER — Ambulatory Visit: Payer: Medicaid Other | Admitting: Family Medicine

## 2013-10-09 ENCOUNTER — Other Ambulatory Visit: Payer: Self-pay

## 2013-10-09 MED ORDER — WARFARIN SODIUM 3 MG PO TABS
3.0000 mg | ORAL_TABLET | Freq: Every day | ORAL | Status: DC
Start: 2013-10-09 — End: 2013-11-18

## 2013-10-09 NOTE — Telephone Encounter (Signed)
Last seen 04/24/13  B [place]

## 2013-11-13 ENCOUNTER — Telehealth: Payer: Self-pay | Admitting: Family Medicine

## 2013-11-13 NOTE — Telephone Encounter (Signed)
protime appointment given for tomorrow with Tammy and then follow up on Monday with [facility]

## 2013-11-14 ENCOUNTER — Other Ambulatory Visit: Payer: Self-pay | Admitting: *Deleted

## 2013-11-14 ENCOUNTER — Ambulatory Visit: Payer: Medicaid Other | Admitting: Pharmacist

## 2013-11-14 DIAGNOSIS — Z23 Encounter for immunization: Secondary | ICD-10-CM

## 2013-11-14 DIAGNOSIS — I2699 Other pulmonary embolism without acute cor pulmonale: Secondary | ICD-10-CM

## 2013-11-14 LAB — POCT INR: INR: 4.5

## 2013-11-14 NOTE — Progress Notes (Signed)
Patient ID: Jason Strong, male   DOB: 1961-01-11, 53 y.o.   MRN: [id number]  See anticoagulation note

## 2013-11-14 NOTE — Patient Instructions (Signed)
Anticoagulation Dose Instructions as of 11/14/2013     Jason Strong Tue Wed Thu Fri Sat   New Dose 1.5 mg 3 mg 3 mg 3 mg 1.5 mg 3 mg 3 mg    Description       No warfarin today or tomorrow.  Then decrease warfarin dose to 1/2 table sundays and thursdays and 1 tablet all other days.       INR was 4.5 today

## 2013-11-18 ENCOUNTER — Ambulatory Visit (INDEPENDENT_AMBULATORY_CARE_PROVIDER_SITE_OTHER): Payer: Medicaid Other | Admitting: Family Medicine

## 2013-11-18 ENCOUNTER — Encounter: Payer: Self-pay | Admitting: Family Medicine

## 2013-11-18 VITALS — BP 127/91 | HR 86 | Temp 97.8°F

## 2013-11-18 DIAGNOSIS — I2699 Other pulmonary embolism without acute cor pulmonale: Secondary | ICD-10-CM

## 2013-11-18 DIAGNOSIS — J302 Other seasonal allergic rhinitis: Secondary | ICD-10-CM

## 2013-11-18 DIAGNOSIS — I2782 Chronic pulmonary embolism: Secondary | ICD-10-CM

## 2013-11-18 DIAGNOSIS — F411 Generalized anxiety disorder: Secondary | ICD-10-CM

## 2013-11-18 DIAGNOSIS — G894 Chronic pain syndrome: Secondary | ICD-10-CM

## 2013-11-18 LAB — POCT INR: INR: 1.2

## 2013-11-18 MED ORDER — CLONAZEPAM 0.5 MG PO TABS: 0.5000 mg | ORAL_TABLET | Freq: Every day | ORAL | Status: DC

## 2013-11-18 MED ORDER — WARFARIN SODIUM 3 MG PO TABS: 3.0000 mg | ORAL_TABLET | Freq: Every day | ORAL | Status: DC

## 2013-11-18 MED ORDER — QUETIAPINE FUMARATE 25 MG PO TABS: 25.0000 mg | ORAL_TABLET | Freq: Two times a day (BID) | ORAL | Status: AC

## 2013-11-18 MED ORDER — MONTELUKAST SODIUM 10 MG PO TABS: 10.0000 mg | ORAL_TABLET | Freq: Every day | ORAL | Status: AC

## 2013-11-18 MED ORDER — OXYCODONE HCL 5 MG PO TABS: 5.0000 mg | ORAL_TABLET | Freq: Four times a day (QID) | ORAL | Status: DC | PRN

## 2013-11-18 NOTE — Progress Notes (Signed)
   Subjective:    Patient ID: Jason Strong, male    DOB: 03/20/1960, 53 y.o.   MRN: [id number]  HPI C/o routine follow up.  He has hx of pulmonary embolism, GAD, allergies, and cva.  He has chronic pain and was having oxycodone rx'd by his cancer doctor but he states they told him to follow up with his PCP.  He is having chronic jaw pain.  He has hx of head and neck cancer and has s/p head and neck surgery.  He suffered CVA.  He has chronic back pain.  Review of Systems  Constitutional: Negative for fever.  HENT: Negative for ear pain.   Eyes: Negative for discharge.  Respiratory: Negative for cough.   Cardiovascular: Negative for chest pain.  Gastrointestinal: Negative for abdominal distention.  Endocrine: Negative for polyuria.  Genitourinary: Negative for difficulty urinating.  Musculoskeletal: Negative for gait problem and neck pain.  Skin: Negative for color change and rash.  Neurological: Negative for speech difficulty and headaches.  Psychiatric/Behavioral: Negative for agitation.       Objective:    BP 127/91  Pulse 86  Temp(Src) 97.8 F (36.6 C) (Oral) Physical Exam  Constitutional: He is oriented to person, place, and time.  Chronically ill appearing wheelchair bound male in NAD.  HENT:  Head: Normocephalic and atraumatic.  Mouth/Throat: Oropharynx is clear and moist.  Eyes: Pupils are equal, round, and reactive to light.  Neck: Normal range of motion. Neck supple.  Cardiovascular: Normal rate and regular rhythm.   No murmur heard. Pulmonary/Chest: Effort normal and breath sounds normal.  Abdominal: Soft. Bowel sounds are normal. There is no tenderness.  Neurological: He is alert and oriented to person, place, and time.  Skin: Skin is warm and dry.  Psychiatric: He has a normal mood and affect.          Assessment & Plan:     ICD-9-CM ICD-10-CM   1. GAD (generalized anxiety disorder) 300.02 F41.1 clonazePAM (KLONOPIN) 0.5 MG tablet     QUEtiapine  (SEROQUEL) 25 MG tablet  2. Chronic pulmonary embolism 416.2 I27.82 warfarin (COUMADIN) 3 MG tablet  3. Seasonal allergies 477.9 J30.2 montelukast (SINGULAIR) 10 MG tablet  4. Chronic pain syndrome 338.4 G89.4 oxyCODONE (OXY IR/ROXICODONE) 5 MG immediate release tablet     No Follow-up on file.  Lysbeth Penner FNP

## 2013-11-28 ENCOUNTER — Ambulatory Visit (INDEPENDENT_AMBULATORY_CARE_PROVIDER_SITE_OTHER): Payer: Self-pay | Admitting: Pharmacist

## 2013-11-28 DIAGNOSIS — I2782 Chronic pulmonary embolism: Secondary | ICD-10-CM

## 2013-11-28 LAB — POCT INR: INR: 3.5

## 2013-11-28 NOTE — Patient Instructions (Signed)
Anticoagulation Dose Instructions as of 11/28/2013      Jason Strong Tue Wed Thu Fri Sat   New Dose 1.5 mg 3 mg 1.5 mg 3 mg 1.5 mg 3 mg 1.5 mg    Description        No warfarin today - Thursday, November 5th, then decrease dose to 1 tablet mondays, wednesdays and fridays and 1/2 tablet all other days.       INR was 3.5 today

## 2013-12-12 ENCOUNTER — Telehealth: Payer: Self-pay | Admitting: Family Medicine

## 2013-12-16 NOTE — Telephone Encounter (Signed)
Checking on refill request for hydrocodone from 11/19.  Call [phone]

## 2013-12-17 NOTE — Telephone Encounter (Signed)
Pt notified to continue getting pain meds from Dr at [facility] since they are treating this problem Verbalizes understanding

## 2014-01-01 ENCOUNTER — Telehealth: Payer: Self-pay | Admitting: Pharmacist

## 2014-01-01 NOTE — Telephone Encounter (Signed)
Patient missed last protime appt.  Called his brother and made appt for 01/06/14 at 3:30pm.

## 2014-01-13 ENCOUNTER — Encounter (HOSPITAL_COMMUNITY): Payer: Self-pay

## 2014-01-13 ENCOUNTER — Telehealth: Payer: Self-pay | Admitting: Pharmacist

## 2014-01-13 ENCOUNTER — Emergency Department (HOSPITAL_COMMUNITY)
Admission: EM | Admit: 2014-01-13 | Discharge: 2014-01-13 | Disposition: A | Discharge status: Home / Self Care | Payer: Medicaid Other | Attending: Emergency Medicine | Admitting: Emergency Medicine

## 2014-01-13 DIAGNOSIS — Z720 Tobacco use: Secondary | ICD-10-CM | POA: Diagnosis not present

## 2014-01-13 DIAGNOSIS — F419 Anxiety disorder, unspecified: Secondary | ICD-10-CM | POA: Diagnosis not present

## 2014-01-13 DIAGNOSIS — Z9089 Acquired absence of other organs: Secondary | ICD-10-CM | POA: Insufficient documentation

## 2014-01-13 DIAGNOSIS — Z79899 Other long term (current) drug therapy: Secondary | ICD-10-CM | POA: Diagnosis not present

## 2014-01-13 DIAGNOSIS — K9423 Gastrostomy malfunction: Secondary | ICD-10-CM | POA: Insufficient documentation

## 2014-01-13 DIAGNOSIS — I10 Essential (primary) hypertension: Secondary | ICD-10-CM | POA: Diagnosis not present

## 2014-01-13 DIAGNOSIS — Z8673 Personal history of transient ischemic attack (TIA), and cerebral infarction without residual deficits: Secondary | ICD-10-CM | POA: Insufficient documentation

## 2014-01-13 DIAGNOSIS — Z9889 Other specified postprocedural states: Secondary | ICD-10-CM | POA: Insufficient documentation

## 2014-01-13 DIAGNOSIS — Z8701 Personal history of pneumonia (recurrent): Secondary | ICD-10-CM | POA: Insufficient documentation

## 2014-01-13 DIAGNOSIS — Z86711 Personal history of pulmonary embolism: Secondary | ICD-10-CM | POA: Insufficient documentation

## 2014-01-13 DIAGNOSIS — Z923 Personal history of irradiation: Secondary | ICD-10-CM | POA: Diagnosis not present

## 2014-01-13 DIAGNOSIS — Z7901 Long term (current) use of anticoagulants: Secondary | ICD-10-CM | POA: Diagnosis not present

## 2014-01-13 NOTE — ED Notes (Signed)
Dr. Reather Converse in to replace button g-tube with a 20FR foley.  Patient tolerated procedure well.

## 2014-01-13 NOTE — ED Notes (Signed)
Patient states he placed the feeding tube back in after it fell out

## 2014-01-13 NOTE — ED Provider Notes (Signed)
CSN: [id number]     Arrival date & time 01/13/14  1928 History   First MD Initiated Contact with Patient 01/13/14 2047     This chart was scribed for No att. providers found by Forrestine Him, ED Scribe. This patient was seen in room APOTF/OTF and the patient's care was started 1:48 AM.   Chief Complaint  Patient presents with  . GI Problem   The history is provided by the patient. No language interpreter was used.    HPI Comments: Jason Strong is a 53 y.o. male who presents to the Emergency Department here for a GI problem today. Pt states his feeding tube fell out at approximately 4:30 PM this afternoon. States he placed the tube back into his stomach after falling out. Tube was placed approximately 1 year ago for cancer diagnosis. No recent fever or chills.  Past Medical History  Diagnosis Date  . History of stomach ulcers   . Hypertension   . GERD (gastroesophageal reflux disease)   . Stroke 01/2012    "left side is nervous; I'm taking PT" (06/19/2012)  . ICH (intracerebral hemorrhage) 02/20/2012  . Pulmonary embolism   . Anxiety   . Tongue cancer   . Pneumonia   . History of radiation therapy 01/14/2013-02/26/2013    66 gray to left neck   Past Surgical History  Procedure Laterality Date  . Craniectomy  02/20/2012    Procedure: CRANIECTOMY POSTERIOR FOSSA DECOMPRESSION;  Surgeon: Charlie Pitter, MD;  Location: [place] [facility];  Service: Neurosurgery;  Laterality: N/A;  Craniectomy Posterior Fossa Decompression   . Appendectomy      "when I was young" (06/19/2012)  . Hemiglossectomy Left 08/08/2012  . Mandibulectomy  08/08/2012    with plating, left side  . Tracheostomy  08/08/2012  . Selective neck dissection  08/08/2012  . Free flap radial forearm  08/08/2012  . Skin graft split thickness leg / foot  08/08/2012  . Gastrostomy tube placement  09/03/2012   Family History  Problem Relation Age of Onset  . Ovarian cancer Mother   . Stroke Father    History  Substance Use Topics   . Smoking status: Current Every Day Smoker -- 0.25 packs/day for 35 years    Types: Cigarettes  . Smokeless tobacco: Never Used  . Alcohol Use: No     Comment: 06/19/2012 "4-5 beers/day; probably 3 days/wk"    Review of Systems  All other systems reviewed and are negative.     Allergies  Bee venom  Home Medications   Prior to Admission medications   Medication Sig Start Date End Date Taking? Authorizing Provider  clonazePAM (KLONOPIN) 0.5 MG tablet Take 1 tablet (0.5 mg total) by mouth at bedtime. 11/18/13  Yes Lysbeth Penner, FNP  ferrous sulfate 325 (65 FE) MG tablet Take 325 mg by mouth 2 (two) times daily.   Yes Historical Provider, MD  folic acid (FOLVITE) 1 MG tablet Take 1 mg by mouth 2 (two) times daily.  09/28/12  Yes Historical Provider, MD  montelukast (SINGULAIR) 10 MG tablet Take 1 tablet (10 mg total) by mouth at bedtime. 11/18/13  Yes Lysbeth Penner, FNP  omeprazole (PRILOSEC) 20 MG capsule Take 20 mg by mouth every morning.   Yes Historical Provider, MD  Oxycodone HCl 10 MG TABS Take 10 mg by mouth every 4 (four) hours. 01/08/14 02/07/14 Yes Historical Provider, MD  Pediatric Multiple Vit-C-FA (FLINTSTONES/MY FIRST) WITH C & FA CHEW Chew 1 tablet by mouth  daily.   Yes Historical Provider, MD  QUEtiapine (SEROQUEL) 25 MG tablet Take 1 tablet (25 mg total) by mouth 2 (two) times daily. 11/18/13  Yes Lysbeth Penner, FNP  warfarin (COUMADIN) 3 MG tablet Take 1 tablet (3 mg total) by mouth daily. 11/18/13  Yes Lysbeth Penner, FNP  oxyCODONE (OXY IR/ROXICODONE) 5 MG immediate release tablet Take 1 tablet (5 mg total) by mouth every 6 (six) hours as needed for severe pain. Patient not taking: Reported on 01/13/2014 11/18/13   Lysbeth Penner, FNP  Prenatal Vit-Fe Fumarate-FA (M-VIT PO) Take 1 tablet by mouth daily.    Historical Provider, MD   Triage Vitals: BP 140/81 mmHg  Pulse 95  Temp(Src) 98.4 F (36.9 C) (Oral)  Resp 20  Ht 5' 7.5" (1.715 m)  Wt 105 lb  (47.628 kg)  BMI 16.19 kg/m2  SpO2 96%   Physical Exam  Constitutional: He is oriented to person, place, and time. He appears well-developed and well-nourished. No distress.  HENT:  Head: Normocephalic and atraumatic.  Eyes: EOM are normal.  Neck: Normal range of motion.  Cardiovascular: Normal rate, regular rhythm, normal heart sounds and intact distal pulses.   Pulmonary/Chest: Effort normal and breath sounds normal. No respiratory distress.  Abdominal: Soft. He exhibits no distension. There is no tenderness.  G Tube button to upper quadrant No cellulitis Dry skin noted around tube  Musculoskeletal: Normal range of motion.  Neurological: He is alert and oriented to person, place, and time.  Skin: Skin is warm and dry. He is not diaphoretic.  Psychiatric: He has a normal mood and affect. Judgment normal.  Nursing note and vitals reviewed.   ED Course  Procedures (including critical care time) G-tube button replacement. The hospital does not have the specific G-tube button the patient uses. Patient's G-tube malfunctioning, broken, removed and replaced with similar size Foley catheter and clamped. Patient tolerated well, area cleaned prior to exchange. DIAGNOSTIC STUDIES: Oxygen Saturation is 96% RA by my interpretation.    COORDINATION OF CARE: 1:48 AM-Discussed treatment plan with pt at bedside and pt agreed to plan.     Labs Review Labs Reviewed - No data to display  Imaging Review No results found.   EKG Interpretation None      MDM   Final diagnoses:  Gastrostomy tube dysfunction    I personally performed the services described in this documentation, which was scribed in my presence. The recorded information has been reviewed and is accurate.  Unfortunately we do not have the specific type and size G-tube required. Foley placed, close follow-up with patient surgeon [facility] discussed. Results and differential diagnosis were discussed with the  patient/parent/guardian. Close follow up outpatient was discussed, comfortable with the plan.   Medications - No data to display  Filed Vitals:   01/13/14 1931  BP: 140/81  Pulse: 95  Temp: 98.4 F (36.9 C)  TempSrc: Oral  Resp: 20  Height: 5' 7.5" (1.715 m)  Weight: 105 lb (47.628 kg)  SpO2: 96%    Final diagnoses:  Gastrostomy tube dysfunction      Mariea Clonts, MD 01/14/14 [phone]

## 2014-01-13 NOTE — Telephone Encounter (Signed)
Patient's brother called and reminded that INR is needed. Patient missed app 01/06/14,   Erinn Huskins will start chemo treatements this week in [place].  I recommended that they discuss with office where he is getting labs for chemo to see if they would check INR when checking other labs.  Trying to decrease patient's exposure to germs.

## 2014-01-13 NOTE — ED Notes (Signed)
Discharge instructions given and reviewed with patient and family.  Patient to follow up with GI MD at [facility].  Patient discharged home in good condition.

## 2014-01-13 NOTE — Discharge Instructions (Signed)
Call your surgeon's office tomorrow to obtain a new G-tube button. If you are unable to get into the office go to the [facility] ER where you had your surgery.  If you were given medicines take as directed.  If you are on coumadin or contraceptives realize their levels and effectiveness is altered by many different medicines.  If you have any reaction (rash, tongues swelling, other) to the medicines stop taking and see a physician.   Please follow up as directed and return to the ER or see a physician for new or worsening symptoms.  Thank you. Filed Vitals:   01/13/14 1931  BP: 140/81  Pulse: 95  Temp: 98.4 F (36.9 C)  TempSrc: Oral  Resp: 20  Height: 5' 7.5" (1.715 m)  Weight: 105 lb (47.628 kg)  SpO2: 96%

## 2014-01-13 NOTE — ED Notes (Signed)
Patient states his feeding tube came out today at 1630.

## 2014-01-13 NOTE — ED Notes (Signed)
Foley draining from abdomen.  Stopcock valve placed and draining stopped.

## 2014-01-21 ENCOUNTER — Encounter: Payer: Self-pay | Admitting: Pharmacist Clinician (PhC)/ Clinical Pharmacy Specialist

## 2014-01-28 IMAGING — CR DG CHEST 1V PORT
1 series · 1 of 1 positions shown · non-contrast
Comparison: None.

CLINICAL DATA: Stroke.

PORTABLE CHEST - 1 VIEW

[AP]
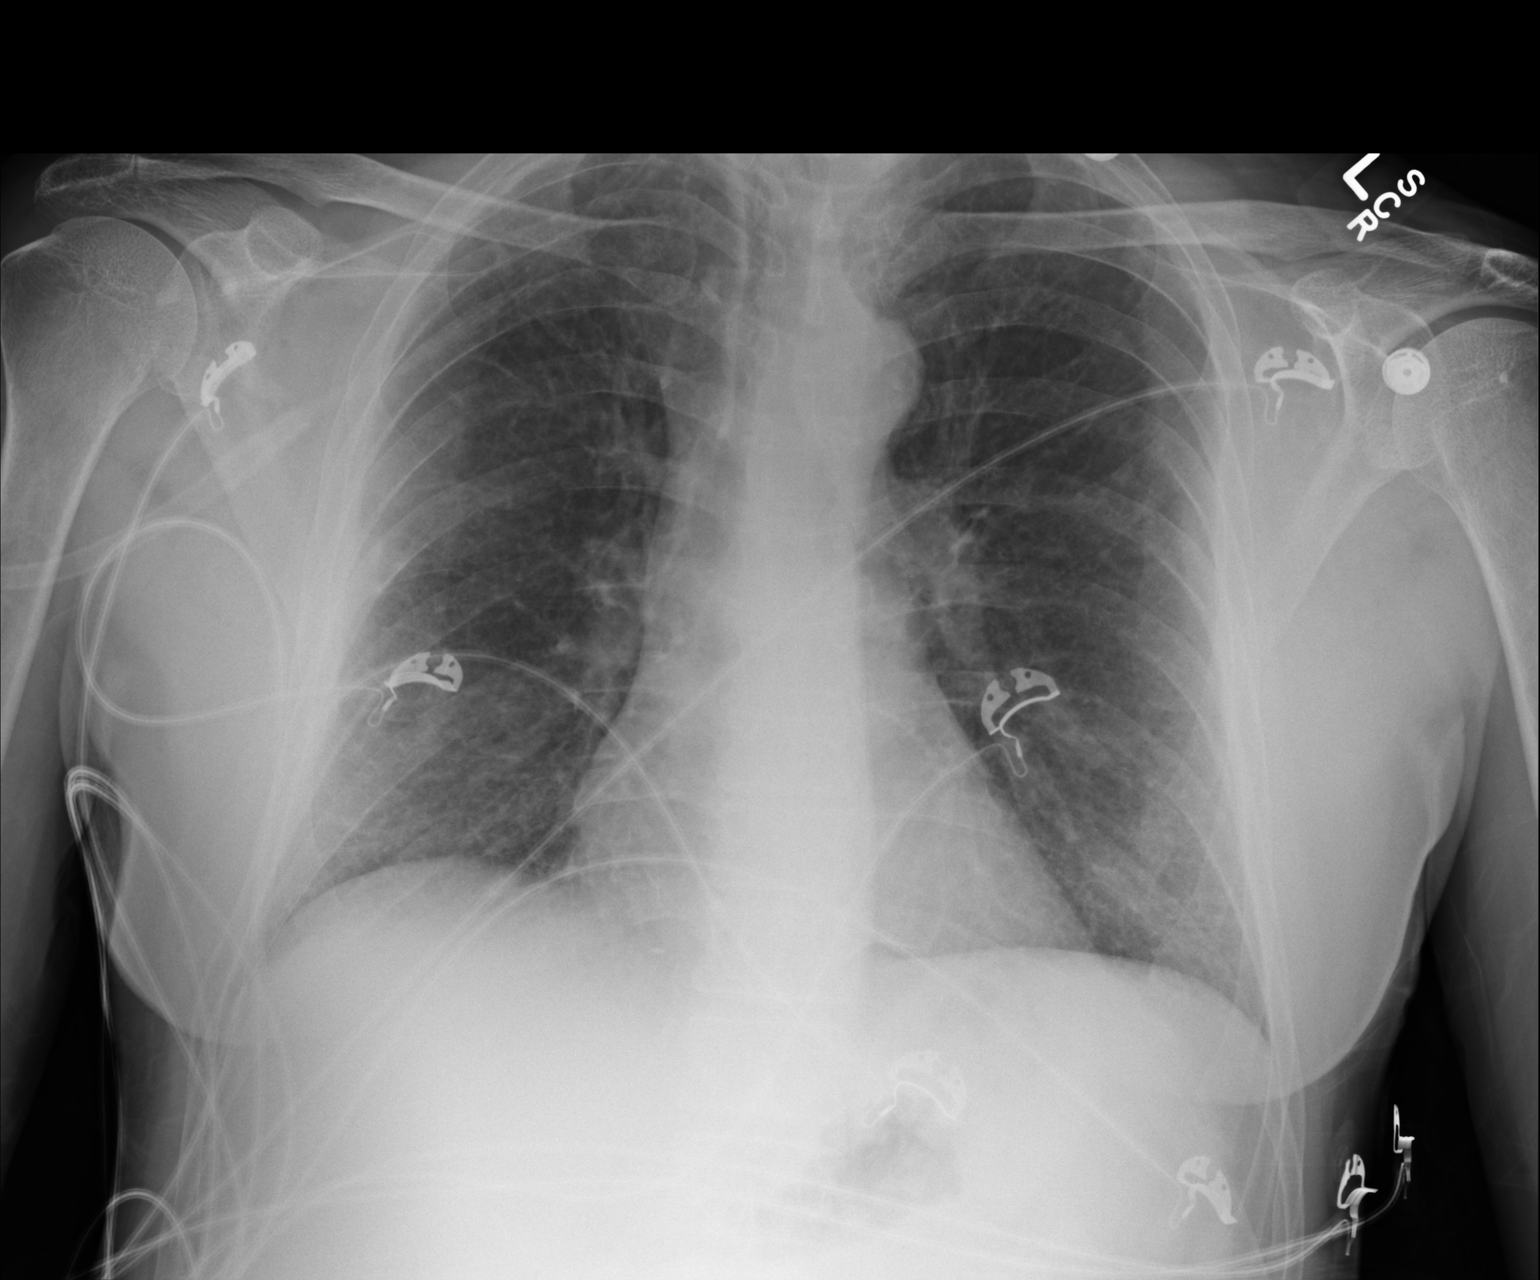

[1 of 1 positions shown; findings below may reference images not displayed]

FINDINGS: Heart is normal size.  No confluent airspace opacities.
Biapical scarring.  No effusions.  No acute bony abnormality.
IMPRESSION: No acute cardiopulmonary disease.

## 2014-01-28 IMAGING — CT CT HEAD W/O CM
1 of 2 series · 13 of 30 positions shown, 17 images · non-contrast
Comparison: None.

CLINICAL DATA: Left-sided weakness with tongue deviation.  Code
stroke.

CT HEAD WITHOUT CONTRAST
TECHNIQUE: Contiguous axial images were obtained from the base of
the skull through the vertex without contrast.

[Series 2: brain · axial · 0.49mm/px · z∈[+126,+251]mm · 13 of 28 slices shown, 17 images]
[im 2/28  brain]
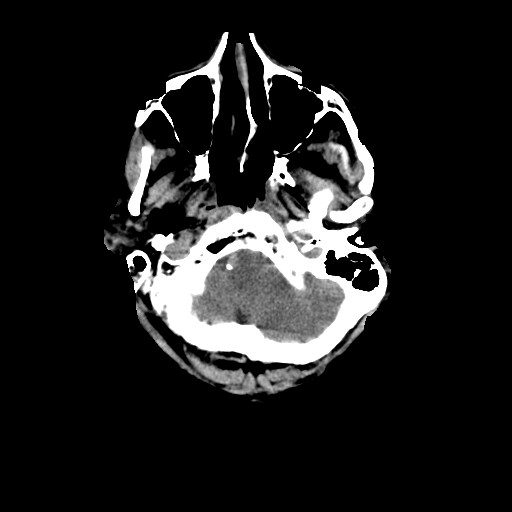
[im 2/28  bone]
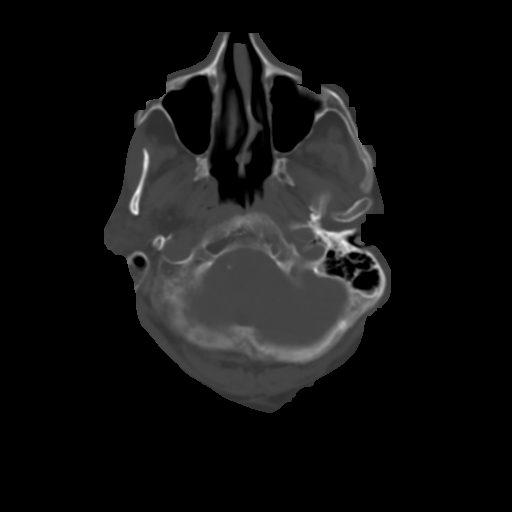
[im 4/28  brain]
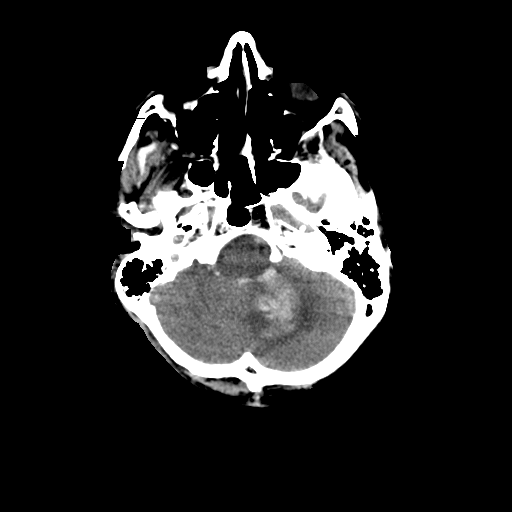
[im 6/28  brain]
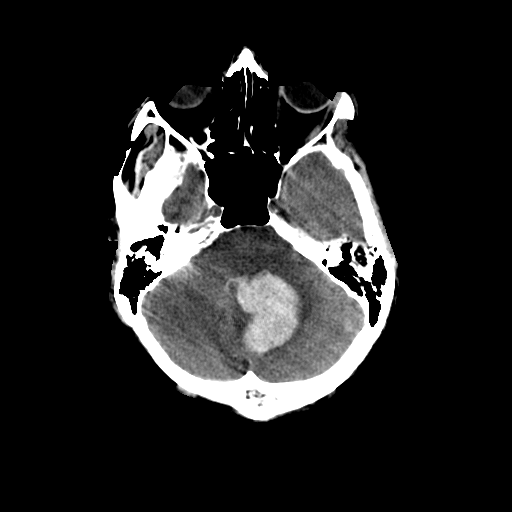
[im 8/28  brain]
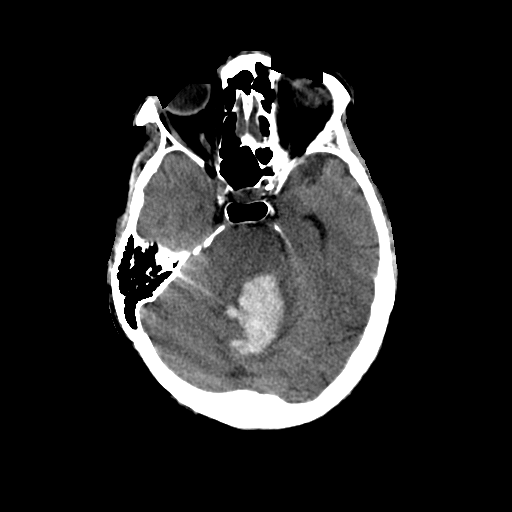
[im 10/28  brain]
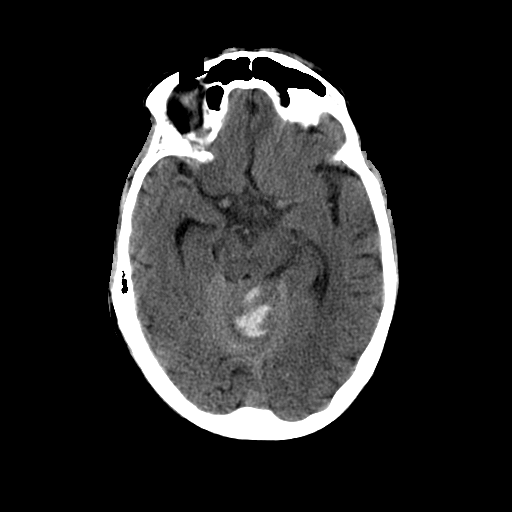
[im 10/28  bone]
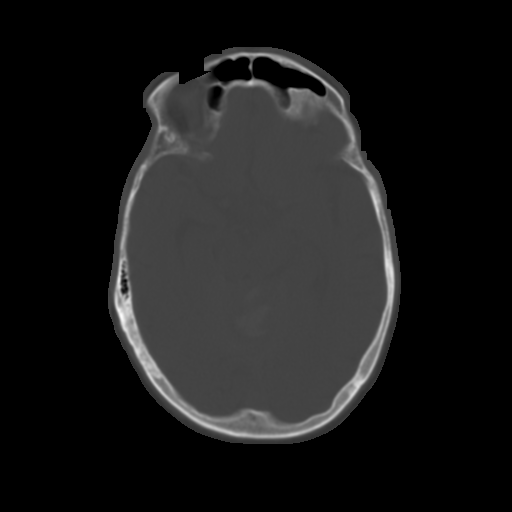
[im 12/28  brain]
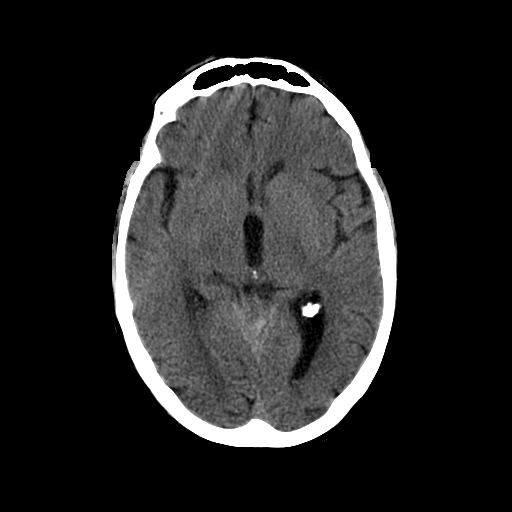
[im 14/28  brain]
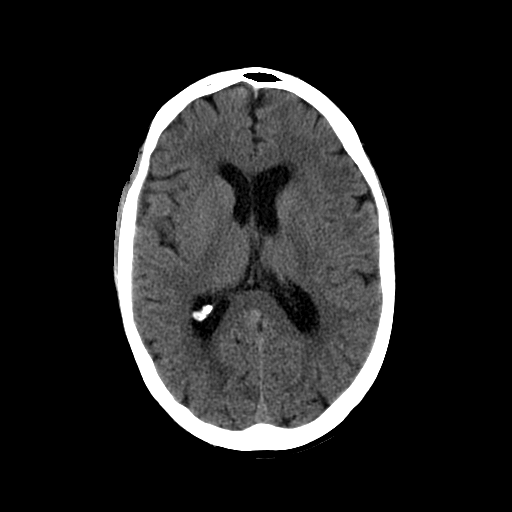
[im 16/28  brain]
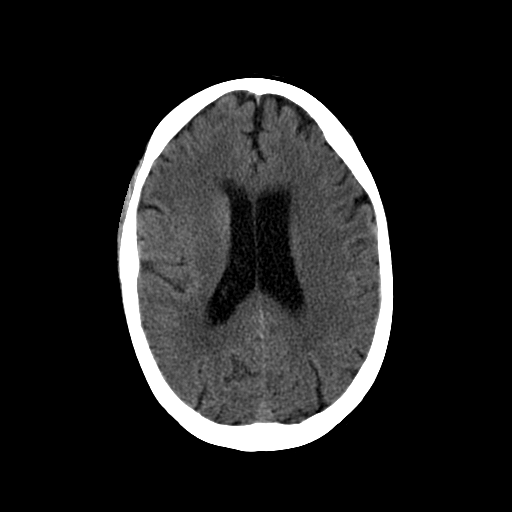
[im 18/28  brain]
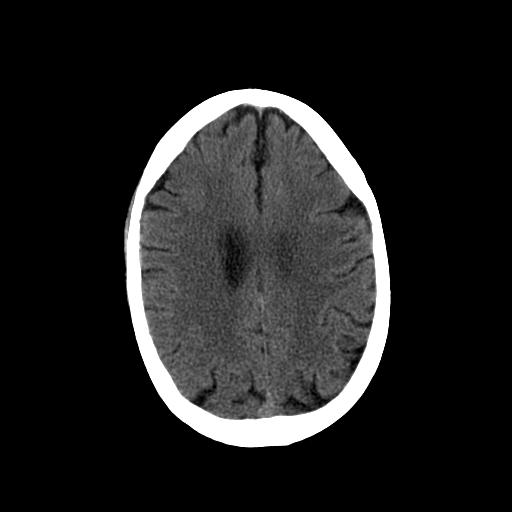
[im 18/28  bone]
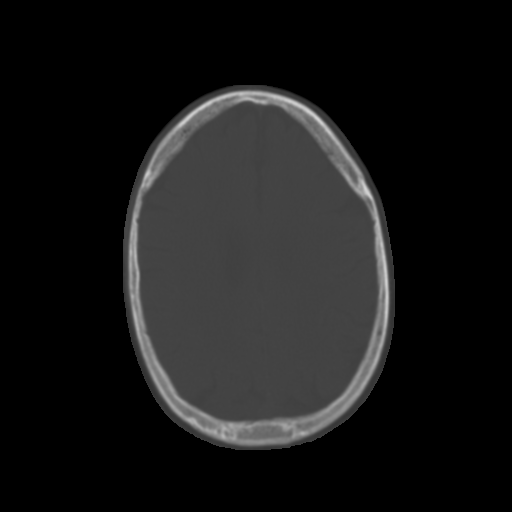
[im 20/28  brain]
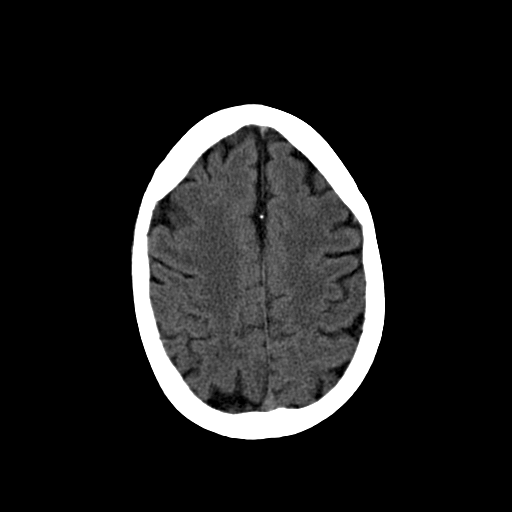
[im 22/28  brain]
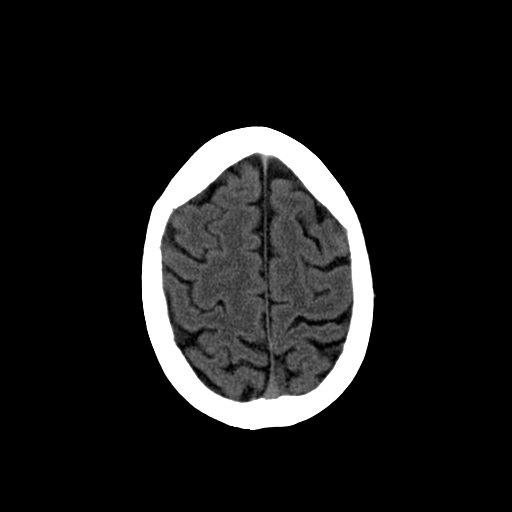
[im 24/28  brain]
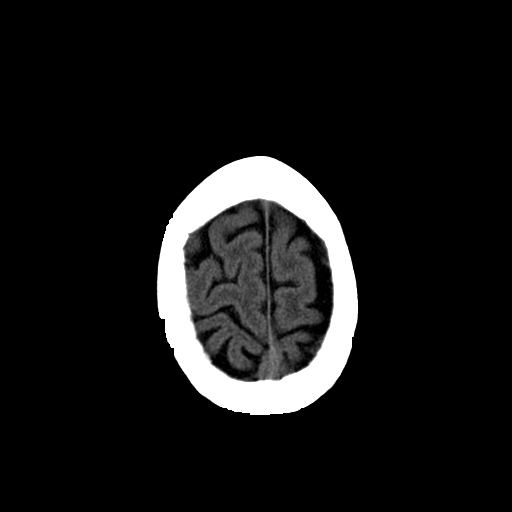
[im 26/28  brain]
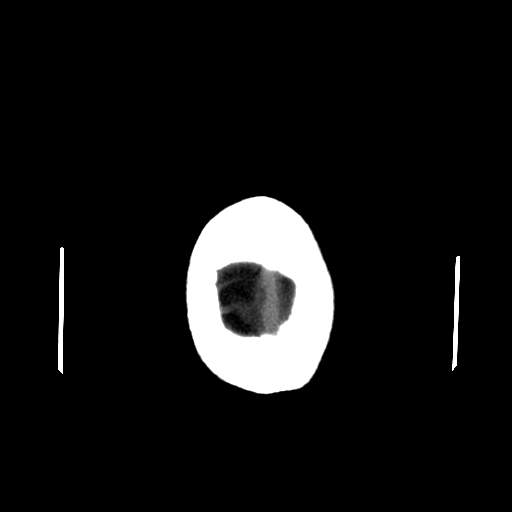
[im 26/28  bone]
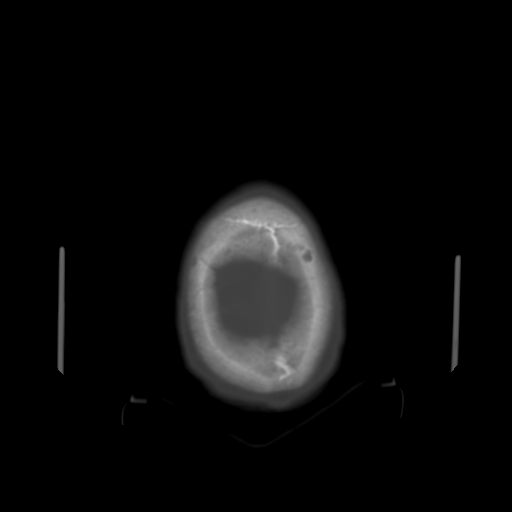

[13 of 30 positions shown; findings below may reference images not displayed]

FINDINGS: There is a large acute cerebellar vermian hematoma off
midline to the left.  This measures 4.1 x 3.2 cm transverse and is
associated with surrounding edema.  There is mass effect on the
third ventricle which is nearly obliterated.  The subarachnoid
spaces within the posterior fossa are partially effaced. There is
possibly some edema within the pons.

There is no evidence of supratentorial hemorrhage or hydrocephalus.
There is no midline shift or extra-axial fluid collection.

The visualized paranasal sinuses are clear.  The calvarium is
intact.
IMPRESSION: Large cerebellar vermian acute hematoma most consistent with a
hypertensive bleed.  There is associated mass effect in the
posterior fossa with possible pontine edema.

Critical Value/emergent results were called by telephone at the
time of interpretation on 02/20/2012 at 4101 hours to Dr.
Avila, who verbally acknowledged these results.

## 2014-01-30 IMAGING — CT CT HEAD W/O CM
1 of 2 series · 13 of 30 positions shown, 17 images · non-contrast
Comparison: 02/20/2012

CLINICAL DATA: Cerebellar hemorrhage

CT HEAD WITHOUT CONTRAST
TECHNIQUE: Contiguous axial images were obtained from the base of
the skull through the vertex without contrast.

[Series 2: brain · axial · 0.47mm/px · z∈[+173,+297]mm · 13 of 28 slices shown, 17 images]
[im 2/28  brain]
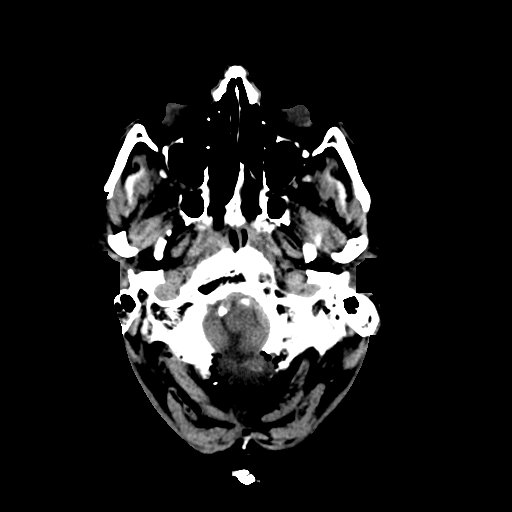
[im 2/28  bone]
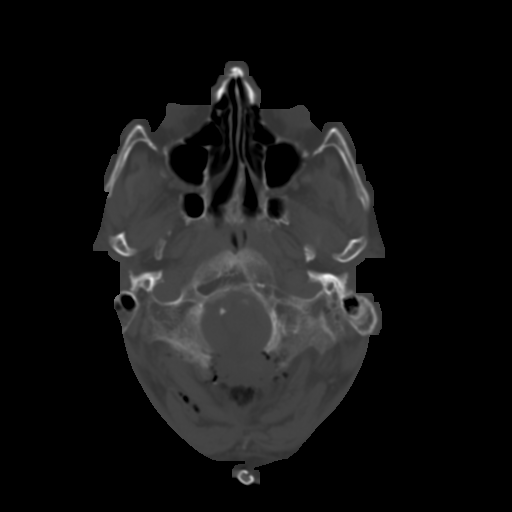
[im 4/28  brain]
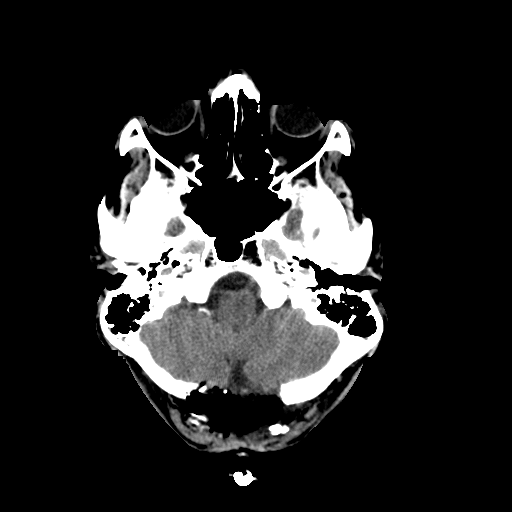
[im 6/28  brain]
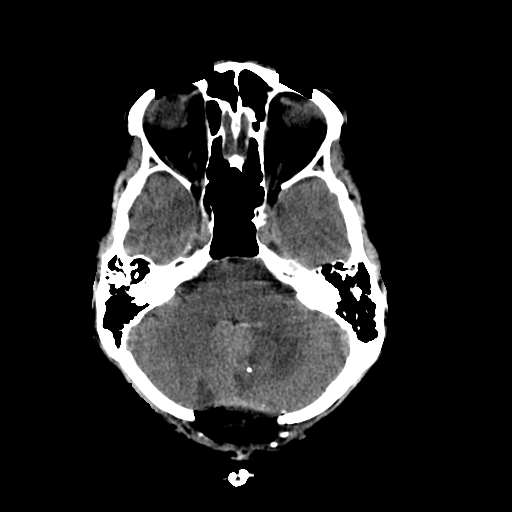
[im 8/28  brain]
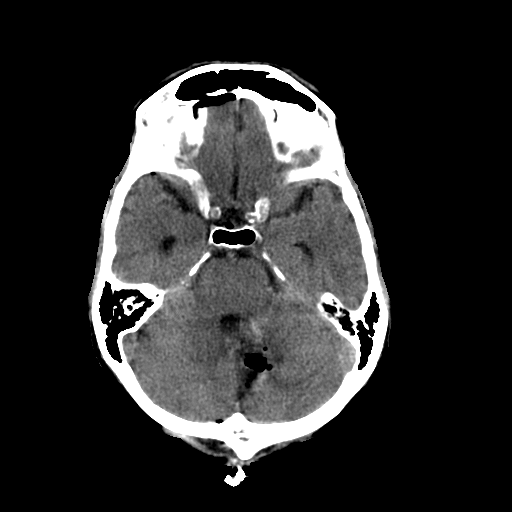
[im 10/28  brain]
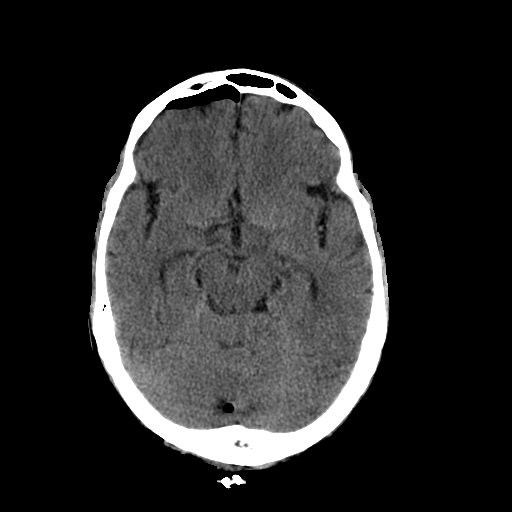
[im 10/28  bone]
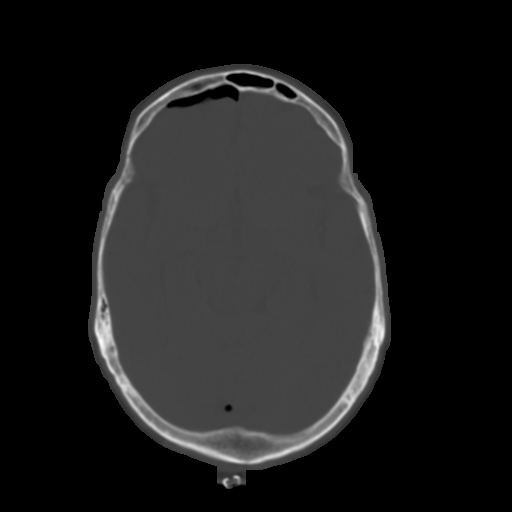
[im 12/28  brain]
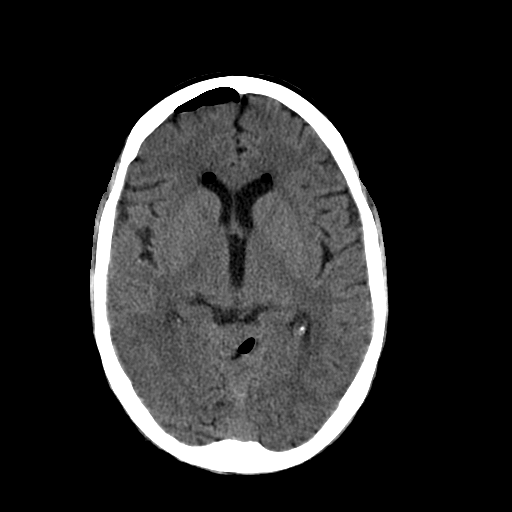
[im 14/28  brain]
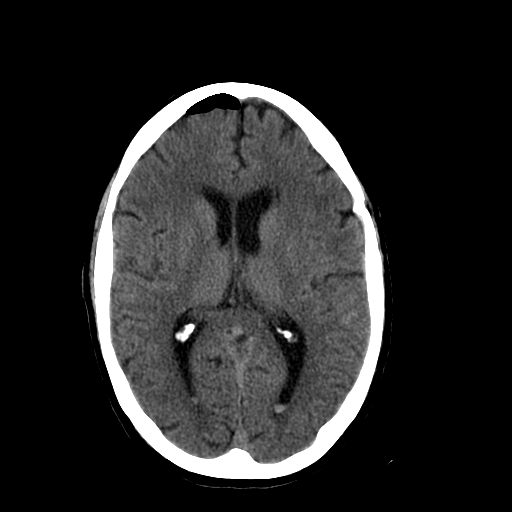
[im 16/28  brain]
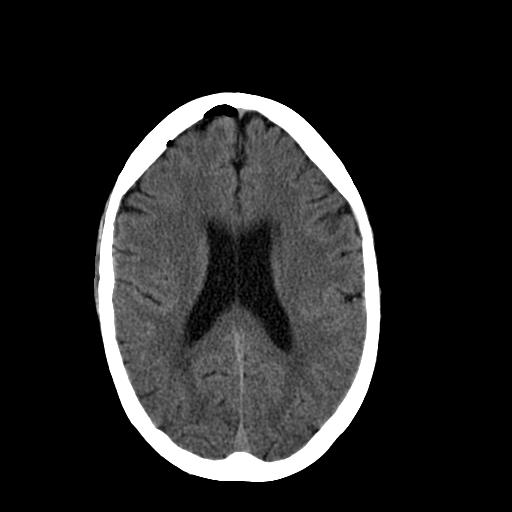
[im 18/28  brain]
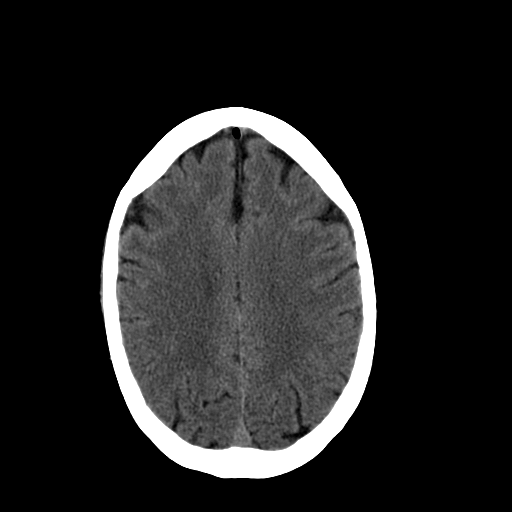
[im 18/28  bone]
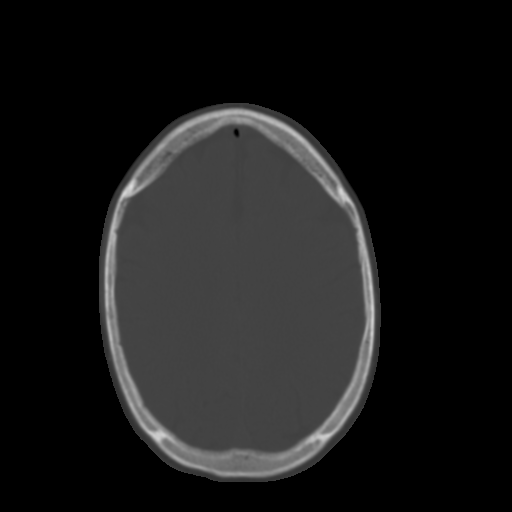
[im 20/28  brain]
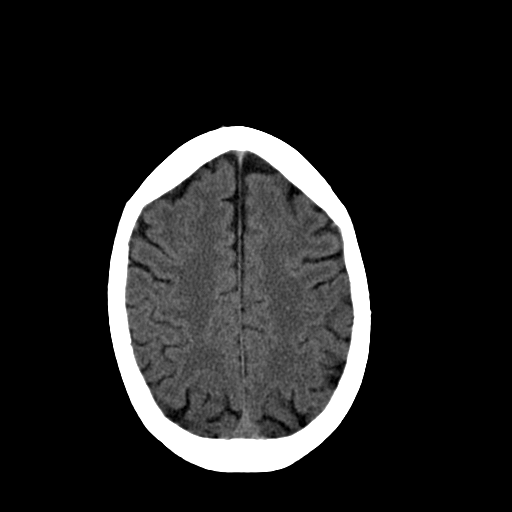
[im 22/28  brain]
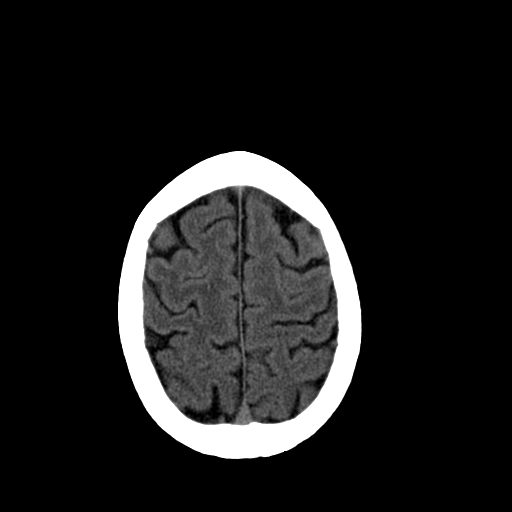
[im 24/28  brain]
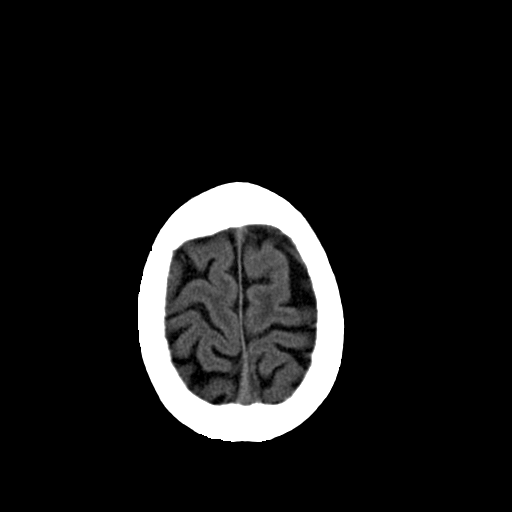
[im 26/28  brain]
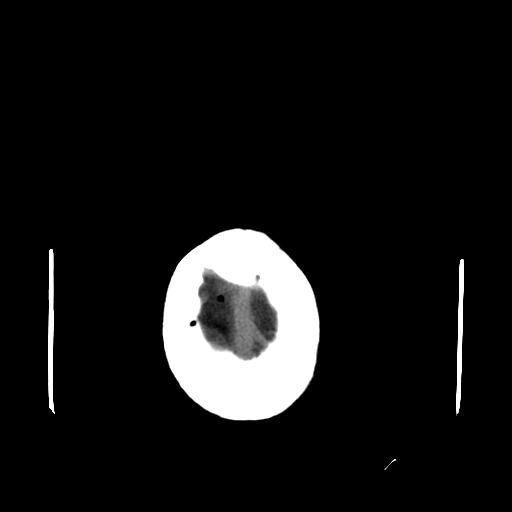
[im 26/28  bone]
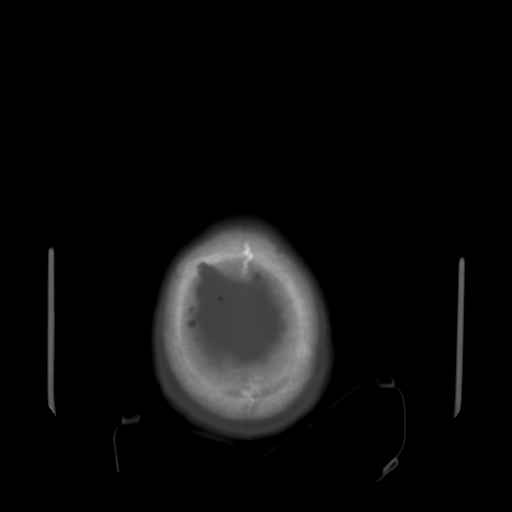

[13 of 30 positions shown; findings below may reference images not displayed]

FINDINGS: Postoperative changes following suboccipital craniectomy
and intracerebellar hematoma evacuation. Pneumocephalus is not
unexpected, collecting at the surgical site as well as within the
foci within the frontal horns of the lateral ventricles and along
the right frontal convexity.  There is a small amount of blood at
the surgical site as well as dependently within the occipital horns
of the lateral ventricles. Mild residual hypoattenuation involving
the evacuated hematoma margins within left cerebellar parenchyma,
suggesting infarction and/or edema.  Significant reduction in mass
effect.  The fourth ventricle is now visualized.  No overt
hydrocephalus. Decreased downward displacement of the tonsils. No
interval cortical based infarction. Clear sinuses and mastoid air
cells.
IMPRESSION: Postoperative changes following suboccipital craniectomy and
intracerebellar hematoma evacuation, with significant decreased
mass effect.

Small amount of residual blood at the surgical site as well as
within the occipital horns lateral ventricles.

## 2014-03-24 ENCOUNTER — Telehealth: Payer: Self-pay | Admitting: Pharmacist

## 2014-03-24 NOTE — Telephone Encounter (Signed)
Left message on Jason Strong's Vm regarding appt for recheck protime.  I know patient was being assessed for cancer treatment and they might be checking.  Called to confirm if patient is still taking warfarin andif so if he is having INR's checked.

## 2014-03-27 ENCOUNTER — Other Ambulatory Visit: Payer: Self-pay | Admitting: *Deleted

## 2014-03-27 DIAGNOSIS — F411 Generalized anxiety disorder: Secondary | ICD-10-CM

## 2014-03-27 MED ORDER — CLONAZEPAM 0.5 MG PO TABS: 0.5000 mg | ORAL_TABLET | Freq: Every day | ORAL | Status: DC

## 2014-03-27 NOTE — Telephone Encounter (Signed)
Last seen 11/18/13 by Sallyanne Havers, last filled 02/26/14. Call into [facility] [place]. [phone]

## 2014-05-21 ENCOUNTER — Other Ambulatory Visit: Payer: Self-pay | Admitting: Family Medicine

## 2014-06-13 ENCOUNTER — Telehealth: Payer: Self-pay | Admitting: Pharmacist

## 2014-06-13 NOTE — Telephone Encounter (Signed)
Called to check on Jason Strong. No answer - LM on VM of emergency contact Jason Strong.  Also left message on VM of Jason Strong.

## 2014-06-27 ENCOUNTER — Telehealth: Payer: Self-pay | Admitting: Pharmacist

## 2014-06-27 NOTE — Telephone Encounter (Signed)
Received call from [place] at [facility].  Patient has been getting chemo treatment from [facility] oncology. They are seeking recommendation as to if patient may stop warfarin prior to insertion of port o cath.   Patient has appt 07/01/14 and we can discuss and give plan then.   Explained that we have not seen patient in over 6 months and he or his family have not returned multiple calls regarding follow up of warfarin / INR checks.

## 2014-07-01 ENCOUNTER — Ambulatory Visit (INDEPENDENT_AMBULATORY_CARE_PROVIDER_SITE_OTHER): Payer: Medicare Other | Admitting: Pharmacist Clinician (PhC)/ Clinical Pharmacy Specialist

## 2014-07-01 DIAGNOSIS — I2699 Other pulmonary embolism without acute cor pulmonale: Secondary | ICD-10-CM | POA: Diagnosis not present

## 2014-07-01 LAB — POCT INR: INR: 5.1

## 2014-07-01 NOTE — Progress Notes (Signed)
Patient is scheduled to have a port o cath placed for chemotherapy at [facility] sometime between 6/16-6/21.  He came in today with a supratherapeutic INR of 5.1.  We had not seen him in office for INR since November.  Patient's brother said Oncology had been managing his warfarin.  Patient was instructed to hold warfarin (no active bleeding) and try and consume some high vitamin K foods.  He goes to [facility] in 2 days and will have INR checked.  Lovenox bridging plan is to give 40mg  lovenox sub q12 hours starting after INR has returned to normal and has been held for 24 hours.  Last dose of lovenox is 12 hours prior to procedure.  Re-start lovenox 24 hours after procedure along with warfarin and continue lovenox until warfarin level is therapeutic.

## 2014-07-02 ENCOUNTER — Telehealth: Payer: Self-pay | Admitting: Pharmacist Clinician (PhC)/ Clinical Pharmacy Specialist

## 2014-07-02 NOTE — Telephone Encounter (Signed)
Messaged Paulla Dolly, PharmD at [facility] regarding dosing and administration of lovenox for this patient.  prelimary plan was faxed over to [facility] with instructions on his INR from yesterday and that it would need to be re-checked on 6/9.  Port o cath placement has not been scheduled yet.  No prescription has been called into patient's pharmacy for lovenox.  I will send recommendations and call his nurse at [facility] once Ulice Dash has responded with his recommendations for this patient's lovenox bridge.

## 2014-07-03 ENCOUNTER — Telehealth: Payer: Self-pay | Admitting: Pharmacist Clinician (PhC)/ Clinical Pharmacy Specialist

## 2014-07-03 NOTE — Telephone Encounter (Signed)
I received INR result from [place] at [facility] heamotolgy/oncology department regarding Jason Strong INR result today.  It was 1.3.  This is a dramatic drop from 5.1 only 48 hours earlier.  Patient had not received any vitamin K in office.  He did not have a a fingerstick INR at [facility].  I called his daughter-in-law Jason Strong) with the results and the following directions (which she wrote down and repeated back to me).  Patient will take warfarin 1/2 tablet (3mg  strength) tonight and Friday evening.  He will begin holding warfarin on Saturday 6/11.  He will begin Lovenox injections (20mg  q12hours) beginning on Sunday evening.  He will continue Lovenox until Wednesday evening with his last lovenox dose prior to 6pm.  He will not taking any lovenox or warfarin until the evening of 6/17 (or what his surgeon instructs on resuming warfarin and lovenox).  Patient will be set up with home health to check INRs until he is therapeutic and can stop his Lovenox.  [facility] will draw INRs on his weekly appointment on Wednesdays and have requested our office to monitor and dose his INRs.  This patient is new to me, I have had one visit with him (this past week).  He will need to establish care with a PCP in our office to continue to receive anticoagulation monitoring.  I also counseled Jason Strong on the importance of keeping his INR appointments.  Lovenox prescription was sent to [facility].  Patient's weight per [facility] is 40.1kg and serum creatinine 0.75mg /dL.

## 2014-07-09 ENCOUNTER — Telehealth: Payer: Self-pay | Admitting: Pharmacist

## 2014-07-09 ENCOUNTER — Ambulatory Visit: Payer: Self-pay | Admitting: Family Medicine

## 2014-07-09 NOTE — Telephone Encounter (Signed)
Patient was suppose to come in today to see Dr Sabra Heck to have face to face visit and to have protime checked prior to procedure tomorrow.  Patient did not show up. This was required to be able to order Home health services.  We are unable to refer for home health services with out a face to face visit will one of our medical providers.  I left a message on [name] his daughter in law's VM.  Patient will have to have INR checked in office or at [facility].   It looks like appt was rescheduled for tomorrow 07/10/14. I will be out of office so results of INR should be faxed to [facility] either Margarita Grizzle or [place] fax [phone]. Phone [phone]. I spoke with a nurse at [facility] and she will reiterate to patient how improtant tomorrow's appt is to be able to set up [place].  Also was told that port placement is scheduled for Friday, June 17th and not tomorrow 07/10/14.

## 2014-07-10 ENCOUNTER — Encounter: Payer: Self-pay | Admitting: Family Medicine

## 2014-07-10 ENCOUNTER — Ambulatory Visit (INDEPENDENT_AMBULATORY_CARE_PROVIDER_SITE_OTHER): Payer: Medicare Other | Admitting: Family Medicine

## 2014-07-10 VITALS — BP 98/69 | HR 99 | Temp 98.9°F | Ht 67.5 in | Wt 87.0 lb

## 2014-07-10 DIAGNOSIS — F411 Generalized anxiety disorder: Secondary | ICD-10-CM

## 2014-07-10 DIAGNOSIS — I2699 Other pulmonary embolism without acute cor pulmonale: Secondary | ICD-10-CM | POA: Diagnosis not present

## 2014-07-10 DIAGNOSIS — C021 Malignant neoplasm of border of tongue: Secondary | ICD-10-CM

## 2014-07-10 LAB — POCT INR: INR: 1.2

## 2014-07-10 MED ORDER — CLONAZEPAM 0.5 MG PO TABS: ORAL_TABLET | ORAL | Status: DC

## 2014-07-10 NOTE — Progress Notes (Signed)
Subjective:    Patient ID: Jason Strong, male    DOB: 1960/11/15, 54 y.o.   MRN: [id number]  HPI 53-ye's also be faxed to [facility]  ar-old gentleman with cancer of the tongue who is scheduled to have central line placed tomorrow Marijean Bravo ministration of chemotherapy and blood sampling. He also has a feeding tube and uses formulas intermittently. He is able to swallow by mouth and uses ensure as well as other food stuffs. He receives chemotherapy weekly at [facility]. PT INR was checked today and it was 1.2 so I think he should be able to proceed with line placement tomorrow.  Patient Active Problem List   Diagnosis Date Noted  . Malignant neoplasm of anterior two-thirds of tongue, part unspecified 12/24/2012  . Anemia in chronic illness 09/19/2012  . Low weight 09/19/2012  . Alcohol addiction 08/11/2012  . PE (pulmonary embolism) 07/11/2012  . 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 Other pulmonary embolism and infarction 06/25/2012  . Severe malnutrition 06/22/2012  . Protein-calorie malnutrition, severe 06/21/2012  . Acute pulmonary embolism 06/20/2012  . CAP (community acquired pneumonia) 06/20/2012  . Tongue lesion 06/20/2012  . Dehydration 06/19/2012  . Leucocytosis 06/19/2012  . Alcohol abuse 06/19/2012  . Tobacco use disorder 06/07/2012  . GERD (gastroesophageal reflux disease) 06/07/2012  . Ataxia 03/20/2012  . Difficulty walking 03/20/2012  . 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 Headache(784.0) 02/21/2012  . Acute respiratory failure 02/20/2012  . Altered mental status 02/20/2012  . Hypoxemia 02/20/2012  . HTN (hypertension) 02/20/2012  . ICH (intracerebral hemorrhage) 02/20/2012   Outpatient Encounter Prescriptions as of 07/10/2014  Medication Sig  . Alum & Mag Hydroxide-Simeth (MAGIC MOUTHWASH W/LIDOCAINE) SOLN Take 30 mLs by mouth.  . clindamycin (CLINDAGEL) 1 % gel  Apply three times daily to affected areas  . clonazePAM (KLONOPIN) 0.5 MG tablet Take 1 tablet (0.5 mg total) by mouth at bedtime.  . clonazePAM (KLONOPIN) 0.5 MG tablet Take 0.5 mg by mouth.  . doxycycline (VIBRAMYCIN) 50 MG capsule Take 50 mg by mouth.  . enoxaparin (LOVENOX) 30 MG/0.3ML injection   . ferrous sulfate 325 (65 FE) MG tablet Take 325 mg by mouth.  . folic acid (FOLVITE) 1 MG tablet Take 1 mg by mouth 2 (two) times daily.   . montelukast (SINGULAIR) 10 MG tablet Take 1 tablet (10 mg total) by mouth at bedtime.  Marland Kitchen nystatin (MYCOSTATIN) powder Apply twice daily to affected area for 7 days  . omeprazole (PRILOSEC) 20 MG capsule Take 20 mg by mouth every morning.  . ondansetron (ZOFRAN) 8 MG tablet Take 8 mg by mouth.  . Oxycodone HCl 10 MG TABS Take 10 mg by mouth.  . Pediatric Multiple Vit-C-FA (FLINTSTONES/MY FIRST) WITH C & FA CHEW Chew 1 tablet by mouth daily.  . QUEtiapine (SEROQUEL) 25 MG tablet Take 1 tablet (25 mg total) by mouth 2 (two) times daily.  . [DISCONTINUED] ferrous sulfate 325 (65 FE) MG tablet Take 325 mg by mouth 2 (two) times daily.  Marland Kitchen warfarin (COUMADIN) 3 MG tablet Take 1 tablet (3 mg total) by mouth daily. (Patient not taking: Reported on 07/10/2014)  . [DISCONTINUED] oxyCODONE (OXY IR/ROXICODONE) 5 MG immediate release tablet Take 1 tablet (5 mg total) by mouth every 6 (six) hours as needed for severe pain. (Patient not taking: Reported on 01/13/2014)  . [DISCONTINUED] Prenatal Vit-Fe Fumarate-FA (M-VIT PO) Take 1 tablet by  mouth daily.   No facility-administered encounter medications on file as of 07/10/2014.      Review of Systems  Constitutional: Positive for fatigue.  Respiratory: Negative.   Cardiovascular: Negative.   Gastrointestinal: Negative.   Genitourinary: Negative.   Musculoskeletal: Negative.   Psychiatric/Behavioral: Negative.        Objective:   Physical Exam  Constitutional: He is oriented to person, place, and time.  Small  stature and somewhat emaciated  Cardiovascular: Normal rate and regular rhythm.   Pulmonary/Chest: Effort normal and breath sounds normal.  Neurological: He is alert and oriented to person, place, and time.  Psychiatric: He has a normal mood and affect.     BP 98/69 mmHg  Pulse 99  Temp(Src) 98.9 F (37.2 C) (Oral)  Ht 5' 7.5" (1.715 m)  Wt 87 lb (39.463 kg)  BMI 13.42 kg/m2      Assessment & Plan:  1. GAD (generalized anxiety disorder) He also uses Seroquel for nerves - clonazePAM (KLONOPIN) 0.5 MG tablet; Take one tablet at bedtime and one tablet during the day as needed  Dispense: 45 tablet; Refill: 1  2. PE (pulmonary embolism) PT/INR is non-therapeutic but that is part of the plan so that he can have procedure done tomorrow. He will be bridged with Lovenox and Coumadin restarted afterwards - POCT INR

## 2014-07-15 ENCOUNTER — Telehealth: Payer: Self-pay | Admitting: Pharmacist Clinician (PhC)/ Clinical Pharmacy Specialist

## 2014-07-15 NOTE — Telephone Encounter (Signed)
Called [name] this morning since there are no results from home health for PT/INR.  I left a voicemail asking her to clarify if home health has been checking Jason Strong's INRs and ir [facility] is receiving those results and giving them dosing instructions for warfarin and when to stop lovenox.  If was my understanding our office would be doing this.  However, there are no results in since his port was placed.

## 2014-07-23 ENCOUNTER — Telehealth: Payer: Self-pay | Admitting: *Deleted

## 2014-07-23 LAB — POCT INR: INR: 1.8

## 2014-07-23 NOTE — Telephone Encounter (Signed)
Jason Strong with [facility] called with PT results INR 1.8 PT  21.9 Please review and advies

## 2014-07-24 ENCOUNTER — Ambulatory Visit (INDEPENDENT_AMBULATORY_CARE_PROVIDER_SITE_OTHER): Payer: Medicare Other | Admitting: Pharmacist

## 2014-07-24 NOTE — Telephone Encounter (Signed)
Needed clarification on patient's warfarin dose - called Home Health but she was not at a place that she could check.   Tried to call Karmen Bongo - had to leave message.

## 2014-07-24 NOTE — Telephone Encounter (Signed)
Verified dose with Cindi.  Patient has been taking warfarin 3mg  1/2 tablet daily.  Recommended increase warfarin 1 tablet on thursdays and 1/2 tablet all other days.   Recheck INR in 1 week.  Patient's caregiver aware and also Arbie Cookey with [facility] aware.

## 2014-07-25 ENCOUNTER — Telehealth: Payer: Self-pay | Admitting: *Deleted

## 2014-07-25 ENCOUNTER — Other Ambulatory Visit: Payer: Self-pay | Admitting: Hematology and Oncology

## 2014-07-25 NOTE — Telephone Encounter (Signed)
Oncology Nurse Navigator Documentation  Oncology Nurse Navigator Flowsheets 07/25/2014  Navigator Encounter Type Telephone  Spoke with Jason Strong' sister-in-law/caregiver Caren Griffins) in follow-up to [facility] referral received today from [facility].  She reported that Jason Strong has been receiving weekly chemotherapy over the past months and initially responded well but a recent scan and physical exam indicate that cancer is no longer responding to tmt, is actually growing.  Patient/family no longer want to continue with extensive travel for tmt and asked to be referred to [facility] because of previous positive experience and proximity to home in [place].    Caren Griffins stated their goal is to continue with an alternative to current chemo regime if a viable option but they want to minimize the current travel which is difficult for patient.  She is comfortable for referral to be re-directed to Dr. Whitney Muse at [facility] [place].  I notified Dr. Alvy Bimler.  Time Spent with Patient [place], RN, BSN, [place] at [place] [phone]

## 2014-07-29 NOTE — Telephone Encounter (Signed)
Thanks, Rick!

## 2014-07-30 ENCOUNTER — Ambulatory Visit (INDEPENDENT_AMBULATORY_CARE_PROVIDER_SITE_OTHER): Payer: Medicare Other | Admitting: Pharmacist

## 2014-07-30 ENCOUNTER — Telehealth: Payer: Self-pay

## 2014-07-30 LAB — POCT INR
INR: 1.4
INR: 2.7

## 2014-07-30 NOTE — Telephone Encounter (Signed)
Patient to skip today's dose and then restart 1/2 tablet daily except 1 tablet on thrusdays.  Recheck INR in 1 week. - left message with Arbie Cookey with [facility] and spoke with Cindi his care giver.  She also asked about getting [employer] to send out nursing assistance to help with baths and other personal care.  Discussed with Burt Ek and she is checking into.

## 2014-07-30 NOTE — Telephone Encounter (Signed)
PT 32.3 INR 2.7  2 1/2 mg every day except Thursday 5 mg

## 2014-08-06 ENCOUNTER — Telehealth: Payer: Self-pay

## 2014-08-06 ENCOUNTER — Ambulatory Visit: Payer: Self-pay | Admitting: Pharmacist

## 2014-08-06 NOTE — Telephone Encounter (Signed)
PT 17.0  INR 1.4   Taking 2.5 mg daily

## 2014-08-06 NOTE — Progress Notes (Signed)
No charge for labs or visit - INR check through home health agency - [place]

## 2014-08-06 NOTE — Telephone Encounter (Signed)
Tye Maryland called back and gave her new warfarin dose.  Also ordered recheck INR in 1 week.

## 2014-08-06 NOTE — Telephone Encounter (Signed)
I spoke with [name] who helps patient with his medications and she states that Mr. Pepperman has been taking warfarin 3mg  - 1 tablet on thursdays and 1/2 tablet = 1.5mg  all other days. Maudry Mayhew to given Mr Pruit 1 and 1/2 tablets today = 4.5mg . Then increase dose to 1 tablet or 3mg  on sundays and thursdays and 1/2 tablet or 1.5mg  all other days.  Tried to call Juliann Pulse with [facility] but go VM without identification so left message for her to call office for instructions.

## 2014-08-07 ENCOUNTER — Other Ambulatory Visit: Payer: Self-pay | Admitting: Family Medicine

## 2014-08-08 ENCOUNTER — Encounter (HOSPITAL_COMMUNITY): Payer: Self-pay | Admitting: Hematology & Oncology

## 2014-08-08 ENCOUNTER — Encounter (HOSPITAL_COMMUNITY): Payer: Medicaid Other | Attending: Hematology & Oncology | Admitting: Hematology & Oncology

## 2014-08-08 ENCOUNTER — Encounter: Payer: Self-pay | Admitting: Dietician

## 2014-08-08 VITALS — BP 85/56 | HR 104 | Temp 97.8°F | Resp 18 | Ht 62.25 in | Wt 82.0 lb

## 2014-08-08 DIAGNOSIS — Z86711 Personal history of pulmonary embolism: Secondary | ICD-10-CM

## 2014-08-08 DIAGNOSIS — C021 Malignant neoplasm of border of tongue: Secondary | ICD-10-CM | POA: Diagnosis present

## 2014-08-08 DIAGNOSIS — Z720 Tobacco use: Secondary | ICD-10-CM

## 2014-08-08 DIAGNOSIS — L89159 Pressure ulcer of sacral region, unspecified stage: Secondary | ICD-10-CM

## 2014-08-08 MED ORDER — OXYCODONE HCL 10 MG PO TABS: 10.0000 mg | ORAL_TABLET | ORAL | Status: DC | PRN

## 2014-08-08 MED ORDER — OXYCODONE HCL ER 15 MG PO T12A: 15.0000 mg | EXTENDED_RELEASE_TABLET | Freq: Two times a day (BID) | ORAL | Status: AC

## 2014-08-08 MED ORDER — DOXYCYCLINE HYCLATE 100 MG PO TABS: 100.0000 mg | ORAL_TABLET | Freq: Two times a day (BID) | ORAL | Status: AC

## 2014-08-08 NOTE — Patient Instructions (Signed)
[place] at [facility] Discharge Instructions  RECOMMENDATIONS MADE BY THE CONSULTANT AND ANY TEST RESULTS WILL BE SENT TO YOUR REFERRING PHYSICIAN.  Exam and discussion by Dr. Whitney Muse. Will get PEG replaced on Monday.  You need to be at [facility] at 11:45 am on Monday 08/11/14.  Will make nutritional consult Will make a referral to [place] for wound management of your decubitus ulcer (pressure sore) on your buttock. Will make referral to [facility] to get you acquainted with them. Will try to get authorization for Nivolumab and get started within the next 2 weeks. You will get this medication every 14 days. Prescriptions for: Oxycodone 15 mg every 12 hour (long acting pain medication) Take routinely every 12 hours Oxycodone 10 mg take every 4 hours as needed for break through pain Doxycycline 100 mg take 1 twice daily.  Follow-up - same day as chemotherapy.  Thank you for choosing [place] at [facility] to provide your oncology and hematology care.  To afford each patient quality time with our provider, please arrive at least 15 minutes before your scheduled appointment time.    You need to re-schedule your appointment should you arrive 10 or more minutes late.  We strive to give you quality time with our providers, and arriving late affects you and other patients whose appointments are after yours.  Also, if you no show three or more times for appointments you may be dismissed from the clinic at the providers discretion.     Again, thank you for choosing [facility].  Our hope is that these requests will decrease the amount of time that you wait before being seen by our physicians.       _____________________________________________________________  Should you have questions after your visit to [facility], please contact our office at [phone] between the hours of 8:30 a.m. and  4:30 p.m.  Voicemails left after 4:30 p.m. will not be returned until the following business day.  For prescription refill requests, have your pharmacy contact our office.    Nivolumab injection What is this medicine? NIVOLUMAB (nye VOL ue mab) is used to treat certain types of melanoma and lung cancer. This medicine may be used for other purposes; ask your health care provider or pharmacist if you have questions. COMMON BRAND NAME(S): Opdivo What should I tell my health care provider before I take this medicine? They need to know if you have any of these conditions: -eye disease, vision problems -history of pancreatitis -immune system problems -inflammatory bowel disease -kidney disease -liver disease -lung disease -lupus -myasthenia gravis -multiple sclerosis -organ transplant -stomach or intestine problems -thyroid disease -tingling of the fingers or toes, or other nerve disorder -an unusual or allergic reaction to nivolumab, other medicines, foods, dyes, or preservatives -pregnant or trying to get pregnant -breast-feeding How should I use this medicine? This medicine is for infusion into a vein. It is given by a health care professional in a hospital or clinic setting. A special MedGuide will be given to you before each treatment. Be sure to read this information carefully each time. Talk to your pediatrician regarding the use of this medicine in children. Special care may be needed. Overdosage: If you think you've taken too much of this medicine contact a poison control center or emergency room at once. Overdosage: If you think you have taken too much of this medicine contact a poison control center or  emergency room at once. NOTE: This medicine is only for you. Do not share this medicine with others. What if I miss a dose? It is important not to miss your dose. Call your doctor or health care professional if you are unable to keep an appointment. What may interact with this  medicine? Interactions have not been studied. This list may not describe all possible interactions. Give your health care provider a list of all the medicines, herbs, non-prescription drugs, or dietary supplements you use. Also tell them if you smoke, drink alcohol, or use illegal drugs. Some items may interact with your medicine. What should I watch for while using this medicine? Tell your doctor or healthcare professional if your symptoms do not start to get better or if they get worse. Your condition will be monitored carefully while you are receiving this medicine. You may need blood work done while you are taking this medicine. What side effects may I notice from receiving this medicine? Side effects that you should report to your doctor or health care professional as soon as possible: -allergic reactions like skin rash, itching or hives, swelling of the face, lips, or tongue -black, tarry stools -bloody or watery diarrhea -changes in vision -chills -cough -depressed mood -eye pain -feeling anxious -fever -general ill feeling or flu-like symptoms -hair loss -loss of appetite -low blood counts - this medicine may decrease the number of white blood cells, red blood cells and platelets. You may be at increased risk for infections and bleeding -pain, tingling, numbness in the hands or feet -redness, blistering, peeling or loosening of the skin, including inside the mouth -red pinpoint spots on skin -signs of decreased platelets or bleeding - bruising, pinpoint red spots on the skin, black, tarry stools, blood in the urine -signs of decreased red blood cells - unusually weak or tired, feeling faint or lightheaded, falls -signs of infection - fever or chills, cough, sore throat, pain or trouble passing urine -signs and symptoms of a dangerous change in heartbeat or heart rhythm like chest pain; dizziness; fast or irregular heartbeat; palpitations; feeling faint or lightheaded, falls;  breathing problems -signs and symptoms of high blood sugar such as dizziness; dry mouth; dry skin; fruity breath; nausea; stomach pain; increased hunger or thirst; increased urination -signs and symptoms of kidney injury like trouble passing urine or change in the amount of urine -signs and symptoms of liver injury like dark yellow or brown urine; general ill feeling or flu-like symptoms; light-colored stools; loss of appetite; nausea; right upper belly pain; unusually weak or tired; yellowing of the eyes or skin -signs and symptoms of increased potassium like muscle weakness; chest pain; or fast, irregular heartbeat -signs and symptoms of low potassium like muscle cramps or muscle pain; chest pain; dizziness; feeling faint or lightheaded, falls; palpitations; breathing problems; or fast, irregular heartbeat -swelling of the ankles, feet, hands -weight gainSide effects that usually do not require medical attention (report to your doctor or health care professional if they continue or are bothersome): -constipation -general ill feeling or flu-like symptoms -hair loss -loss of appetite -nausea, vomiting This list may not describe all possible side effects. Call your doctor for medical advice about side effects. You may report side effects to FDA at 1-800-FDA-1088. Where should I keep my medicine? This drug is given in a hospital or clinic and will not be stored at home. NOTE: This sheet is a summary. It may not cover all possible information. If you have questions about this medicine,  talk to your doctor, pharmacist, or health care provider.  2015, [employer]. (2013-04-01 13:18:19)

## 2014-08-08 NOTE — Progress Notes (Signed)
[facility] [place] NOTE  Patient Care Team: Chipper Herb, MD as PCP - General (Family Medicine) Leota Sauers, RN as Registered Nurse (Oncology)  CHIEF COMPLAINTS/PURPOSE OF CONSULTATION:  pT4a pN2b M0 oral tongue cancer, originally of the anterior tongue, floor of mouth and abutting the adjacent mandibular alveolus with LAD History of PE 05/2012, ongoing coumadin therapy 08/08/2012 left hemiglossectomy and segmental mandibulectomy with R level 1 neck dissection and Left level 1-4 modified radical neck dissection, Left osteocutaneous radial forearm free flap repair of mandibular and tongue defect at [facility]. 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 His history is detailed above.  He has been receiving palliative chemotherapy at [facility] with recent CT imaging showing additional local progression of disease.  He continues to physically decline. He notes that he has been unable to use his feeding tube for almost a week. He tries to drink his ensure but has a difficult time. His nutritional intake has been poor. He drinks only two cans of ensure daily.  The patient is here today with his sister-in-law.  He is in a wheelchair and can only talk in a whisper.  He presents to discuss his head and neck cancer.  He previously had radiation after surgery and his cancer relapsed.  His sister-in-law states that they have never had an official  reading of his scan results.  He has had 2 scans done at [facility].  He had his neck radiation done by Dr. Sondra Come   The patient lives alone with family living next door and minutes away.  He states that he walks at home.  He does need assistance however, even while in the examination room.  He dresses everyday by himself.  He takes his Oxycodone every 4 hours for his pain. He notes his pain is poorly controlled. He does not have any long acting pain medication.  When asked about his goals, he says he would like to live as long as he can.  He notes that no one has formally discussed his goals of care.  His sister-in-law notes that the family will support whatever decision he makes.   MEDICAL HISTORY:  Past Medical History  Diagnosis Date  . History of stomach ulcers   . Hypertension   . GERD (gastroesophageal reflux disease)   . Stroke 01/2012    "left side is nervous; I'm taking PT" (06/19/2012)  . ICH (intracerebral hemorrhage) 02/20/2012  . Pulmonary embolism   . Anxiety   . Tongue cancer   . Pneumonia   . History of radiation therapy 01/14/2013-02/26/2013    66 gray to left neck    SURGICAL HISTORY: Past Surgical History  Procedure Laterality Date  . Craniectomy  02/20/2012    Procedure: CRANIECTOMY POSTERIOR FOSSA DECOMPRESSION;  Surgeon: Charlie Pitter, MD;  Location: [place] NEURO ORS;  Service: Neurosurgery;  Laterality: N/A;  Craniectomy Posterior Fossa Decompression   . Appendectomy      "when I was young" (06/19/2012)  .  Hemiglossectomy Left 08/08/2012  . Mandibulectomy  08/08/2012    with plating, left side  . Tracheostomy  08/08/2012  . Selective neck dissection  08/08/2012  . Free flap radial forearm  08/08/2012  . Skin graft split thickness leg / foot  08/08/2012  . Gastrostomy tube placement  09/03/2012    SOCIAL HISTORY: History   Social History  . Marital Status: Single    Spouse Name: N/A  . Number of Children: 1  . Years of Education: N/A    Occupational History  . Not on file.   Social History Main Topics  . Smoking status: Current Every Day Smoker -- 0.25 packs/day for 35 years    Types: Cigarettes  . Smokeless tobacco: Never Used  . Alcohol Use: No     Comment: 06/19/2012 "4-5 beers/day; probably 3 days/wk"  . Drug Use: No  . Sexual Activity: Not Currently   Other Topics Concern  . Not on file   Social History Narrative  Smoker ETOH, none Used to be employed as a Location manager FAMILY HISTORY: Family History  Problem Relation Age of Onset  . Ovarian cancer Mother   . Stroke Father    has no family status information on file.  Father deceased, 71, stroke and brain aneurysm Mother deceased, 53, cervical cancer  4 bothers, 2 sisters ALLERGIES:  is allergic to bee venom.  MEDICATIONS:  Current Outpatient Prescriptions  Medication Sig Dispense Refill  . Alum & Mag Hydroxide-Simeth (MAGIC MOUTHWASH W/LIDOCAINE) SOLN Take 30 mLs by mouth.    . clindamycin (CLINDAGEL) 1 % gel Apply three times daily to affected areas    . clonazePAM (KLONOPIN) 0.5 MG tablet Take one tablet at bedtime and one tablet during the day as needed 45 tablet 1  . doxycycline (VIBRAMYCIN) 50 MG capsule Take 50 mg by mouth.    . ferrous sulfate 325 (65 FE) MG tablet Take 325 mg by mouth.    . folic acid (FOLVITE) 1 MG tablet Take 1 mg by mouth 2 (two) times daily.     . montelukast (SINGULAIR) 10 MG tablet Take 1 tablet (10 mg total) by mouth at bedtime. 30 tablet 11  . nystatin (MYCOSTATIN) powder Apply twice daily to affected area for 7 days    . omeprazole (PRILOSEC) 20 MG capsule Take 20 mg by mouth every morning.    . ondansetron (ZOFRAN) 8 MG tablet Take 8 mg by mouth every 8 (eight) hours as needed.     . Oxycodone HCl 10 MG TABS Take 10 mg by mouth every 4 (four) hours as needed.    . Pediatric Multiple Vit-C-FA (FLINTSTONES/MY FIRST) WITH C & FA CHEW Chew 1 tablet by mouth daily.    . QUEtiapine (SEROQUEL) 25 MG tablet  Take 1 tablet (25 mg total) by mouth 2 (two) times daily. 30 tablet 11  . warfarin (COUMADIN) 3 MG tablet TAKE 1 TABLET BY MOUTH DAILY. 30 tablet 2  . enoxaparin (LOVENOX) 30 MG/0.3ML injection      No current facility-administered medications for this visit.    Review of Systems  Constitutional: Positive for weight loss.  Neurological: Positive for weakness.   14 point ROS was done and is otherwise as detailed above or in HPI  PHYSICAL EXAMINATION: ECOG PERFORMANCE STATUS: 2 - Symptomatic, <50% confined to bed  There were no vitals filed for this visit. Filed Weights   08/08/14 1400  Weight: 82 lb (37.195 kg)    Physical Exam  Constitutional: He  is oriented to person, place, and time.  Malnourished, thin Chronically ill, in wheelchair  HENT:  Head: Normocephalic and atraumatic.  Right Ear: External ear normal.  Left Ear: External ear normal.  Eyes: Conjunctivae and EOM are normal. Pupils are equal, round, and reactive to light.  Neck: No thyromegaly present.  Grossly deformed from prior surgery and reconstruction and XRT. Firm fixed neck mass.  Prior trach site not completely healed  Cardiovascular: Normal rate and regular rhythm.   Pulmonary/Chest: Effort normal. No respiratory distress. He has no wheezes. He exhibits no tenderness.  Aspiration   Abdominal: Soft. He exhibits no distension. There is no tenderness. There is no rebound and no guarding.  Thin, FT in place with mild drainage around tube  Genitourinary:  Pressure wound on sacral area approximately 8 cm in diameter. Stage II  Musculoskeletal: Normal range of motion. He exhibits no edema or tenderness.  Neurological: He is alert and oriented to person, place, and time.  Skin: Skin is warm and dry.  Psychiatric: Mood and affect normal.  Nursing note and vitals reviewed.     LABORATORY DATA:  I have reviewed the data as listed Lab Results  Component Value Date   WBC 9.7 04/24/2013   HGB 14.7 04/24/2013    HCT 43.8 04/24/2013   MCV 98.4* 04/24/2013   PLT 475* 11/12/2012    RADIOGRAPHIC STUDIES: I have personally reviewed the radiological images as listed and agreed with the findings in the report. Imaging from [place], Jun 25, 2014 09:54:30 AM  INDICATION:  C02.1 Squamous cell carcinoma of lateral tongue ([place])   COMPARISON: Multiple prior CT examinations of the neck, the most recent dated March 26, 2014.  TECHNIQUE: Axial CT images from the skull base to upper chest were obtained after intravenous administration of iodine-based contrast for the primary purpose of visualizing the soft tissues of the neck. Portions of the brain, face, and cervical spine were also included in the imaging field. Supplemental 2D reformatted images were generated and reviewed as needed.  ADDITIONAL TECHNIQUE:  None  FINDINGS: Extensive postsurgical changes related to resection of the left oral tongue and floor of mouth tumor with left segmental mandibulectomy, bilateral neck dissection, and osteocutaneous free flap repair of the left mandibular body, parasymphyseal region, and mandibular angle. No evidence of hardware failure.  The previously described peripherally enhancing necrotic mass along the anterior margin of the left internal carotid artery and left sternocleidomastoid muscle demonstrates slight interval enlargement, particularly laterally, with extension to the skin surface. There is progressive encasement of the left internal carotid artery which remains patent. There is also progressive narrowing of the proximal left external carotid artery by the mass.   Similar appearance of asymmetric fullness of the left nasopharyngeal soft tissues. No focal masslike enhancement.   There are extensive posttreatment changes in the neck related to prior neck dissection and radiation therapy with platysma thickening, stranding within the deep soft tissue planes, and epiglottic edema. The left  IJ vein is resected/occluded.  Scattered subcentimeter lymph nodes along both cervical chains and within the superior mediastinum are similar to the prior study. No new, pathologically enlarged, or centrally necrotic nodes identified.  Atherosclerotic calcification of the aortic arch and great vessels. Atherosclerotic calcification of the carotid circulations bilaterally as well as the cavernous segments of the internal carotid arteries bilaterally. Atherosclerotic calcification of the vertebral arteries bilaterally.   Stable postoperative changes of suboccipital craniectomy with encephalomalacia involving the left inferomedial cerebellar  hemisphere. Redemonstrated within the brain is an abnormal cluster of vessels centered within the left superior cerebellum and adjacent CP angle cistern. Enhancing 8 mm venous structure within the left CP angle cistern remains concerning for a varix. This is contiguous with multiple prominent and slightly dilated veins.   The left native ocular lens is absent.  Debris within the right external auditory canal likely reflects cerumen.  Multilevel cervical spondylosis with associated spinal stenosis, most advanced at C3-C4. Biapical pleural parenchymal scarring. Minimal centrilobular and paraseptal emphysema.   1. Progressive lateral extension of a centrally necrotic mass within the left carotid space with progressive encasement of the left internal carotid artery. The left internal carotid artery remains patent at this level. Progressive narrowing of the proximal left ECA at this level. Findings are concerning for possible disease progression. PET CT could further evaluate. 2. Similar appearance of scattered subcentimeter lymph nodes within the neck bilaterally as well as within the superior mediastinum. No new, enlarging, or centrally necrotic lymph nodes. 3. No substantial change in the asymmetric fullness of the left nasopharyngeal soft tissues. 4. Similar tangle  of vessels within the left superior cerebellum which likely reflects a vascular malformation such as an arteriovenous malformation. 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 The AVM is incompletely evaluated. 5. Additional findings as above.  Findings were discussed with Enid Baas, N.P. at 1346 hours on 06/25/2014.   ASSESSMENT & PLAN:  pT4a pN2b M0 oral tongue cancer, originally of the anterior tongue, floor of mouth and abutting the adjacent mandibular alveolus with LAD History of PE 05/2012, ongoing coumadin therapy 08/08/2012 left hemiglossectomy and segmental mandibulectomy with R level 1 neck dissection and Left level 1-4 modified radical neck dissection, Left osteocutaneous radial forearm free flap repair of mandibular and tongue defect at [facility]. 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 The cancer encases his carotid artery. I directly discussed goals of care with the patient and family,  We discussed hospice and end of life planning.  I addressed the advanced nature of his disease in his neck, his malnutrition and pain. He is reluctant to consider hospice.  I advised him that he has limited options for therapy, nivolumab is his last option and given his current status I would prefer he consider hospice as I feel he will benefit most from palliative care.  I addressed hospice in detail. He will discuss  this with his family over the weekend. He does want to feel well for as long as he can.  I have prescribed him long acting oxycontin for better pain control. I anticipate we will need to increase the dose at his followup next week. He has short acting oxycodone for breakthrough.  He needs home health for wound care of his sacral decubitus.  I emphasized his need to improve nutrition. My understanding is that even with a functioning PEG he only consumed 2 to 3 cans of ensure daily. I will have our nutritionist touch base with the patient.  We  will contact IR and see if they can aid in replacing his FT.  We will arrange for nivolumab therapy, teaching and treatment date. In the interim I have emphasized to the patient and family to discuss end of life issues and goals of care. We discussed filling out a living will.  Orders Placed This Encounter  Procedures  . IR Replc Gastro/Colonic Tube Percut W/Fluoro    FOR MONDAY    Standing Status: Future     Number of Occurrences:      Standing Expiration Date: 10/09/2015    Order Specific Question:  Reason for Exam (SYMPTOM  OR DIAGNOSIS REQUIRED)    Answer:  feeding tube dependent, feeding tube will not work, advanced head and neck cancer    Order Specific Question:  Preferred Imaging Location?    Answer:  [facility]    All questions were answered. The patient knows to call the clinic with any problems, questions or concerns.   This document serves as a record of services personally performed by Ancil Linsey, MD. It was created on her behalf by Janace Hoard, a trained medical scribe. The creation of this record is based on the scribe's personal observations and the provider's statements to them. This document has been checked and approved by the attending provider.  I have reviewed the above documentation for accuracy and completeness, and I agree with the above.  This note was electronically signed.  Kelby Fam. Whitney Muse,  MD

## 2014-08-08 NOTE — Progress Notes (Addendum)
Patient is new to [facility]  Briefly visited pt to assess nutritional status  Wt Readings from Last 10 Encounters:  08/08/14 82 lb (37.195 kg)  07/10/14 87 lb (39.463 kg)  01/13/14 105 lb (47.628 kg)  08/15/13 106 lb 3.2 oz (48.172 kg)  05/09/13 117 lb 8 oz (53.298 kg)  02/26/13 124 lb 12.8 oz (56.609 kg)  02/19/13 129 lb 9.6 oz (58.786 kg)  02/12/13 121 lb 11.2 oz (55.203 kg)  01/30/13 120 lb 8 oz (54.658 kg)  01/22/13 118 lb 8 oz (53.751 kg)   Patient weight has decreased by 23 lbs in the past 7 months  Pt appears extremely emaciated. BMI <14. He is very difficult to hear as his voice is extremely soft. Caregiver reports that he can only swallow thin liquids.  Patient reports oral intake as poor and is suffering from symptoms including lack of appetite and trouble swallowing.  He has had a PEG tube for years but has seldom been used. Caregiver reports it has broken on them a couple times. Surprisingly, pt has never been on a TF regimen and has only just recently started putting Ensure supplements through his tube. It would sound as if pt had tried to maintain weight with oral intake up until this point.   Caregiver states that they have met with dietitian. If he wa being followed by an RD, it was not closely, as he should have had a TF started a long time ago.   If pt is to continue with therapy he will definitely need to be started on a TF regimen. It sounds and looks like he has had inadequate oral intake for so long that he likely is at risk for refeeding syndrome and would need very close monitoring if a TF is started.   As of now, very unsure of pt's plan of therapy/goals of therapy. Will await for initial MD assessment note before pursuing TF regimen  The patient requested some chocolate Ensure supplements which I happily provided.   Left my coupons, samples, and handouts titled "Increasing calories and Protein" and "Soft and Moist High Protein Menu Options".   [name] RD,  LDN Nutrition Pager: [phone] 08/08/2014 4:39 PM

## 2014-08-11 ENCOUNTER — Ambulatory Visit (HOSPITAL_COMMUNITY)
Admission: RE | Admit: 2014-08-11 | Discharge: 2014-08-11 | Disposition: A | Discharge status: Home / Self Care | Payer: Medicare Other | Source: Ambulatory Visit | Attending: Hematology & Oncology | Admitting: Hematology & Oncology

## 2014-08-11 DIAGNOSIS — C760 Malignant neoplasm of head, face and neck: Secondary | ICD-10-CM | POA: Diagnosis not present

## 2014-08-11 DIAGNOSIS — C021 Malignant neoplasm of border of tongue: Secondary | ICD-10-CM

## 2014-08-11 DIAGNOSIS — Z431 Encounter for attention to gastrostomy: Secondary | ICD-10-CM | POA: Diagnosis present

## 2014-08-11 MED ORDER — IOHEXOL 300 MG/ML  SOLN
50.0000 mL | Freq: Once | INTRAMUSCULAR | Status: AC | PRN
  Administered 2014-08-11: 10 mL via INTRAVENOUS

## 2014-08-11 NOTE — Procedures (Signed)
Interventional Radiology Procedure Note  Procedure: Fluoro guided replacement of perc G-tube.  Complications: None Recommendations:  - Ok to shower  - Do not submerge   - Routine care  Signed,  Dulcy Fanny. Earleen Newport, DO

## 2014-08-12 ENCOUNTER — Encounter: Payer: Self-pay | Admitting: Dietician

## 2014-08-12 NOTE — Progress Notes (Signed)
Saw Patient Friday. Following up to start a TF regimen.   Wt Readings from Last 10 Encounters:  08/08/14 82 lb (37.195 kg)  07/10/14 87 lb (39.463 kg)  01/13/14 105 lb (47.628 kg)  08/15/13 106 lb 3.2 oz (48.172 kg)  05/09/13 117 lb 8 oz (53.298 kg)  02/26/13 124 lb 12.8 oz (56.609 kg)  02/19/13 129 lb 9.6 oz (58.786 kg)  02/12/13 121 lb 11.2 oz (55.203 kg)  01/30/13 120 lb 8 oz (54.658 kg)  01/22/13 118 lb 8 oz (53.751 kg)   Per MD, pt and family would like to continue with therapy. Patient is at very poor nutritional status. BMI <14, has extensive difficulty swallowing and has only been drinking 2 ensure supplements a day for his nutrition.  There was some confusion over whether or not patient has used his PEG in the past. When I spoke to the Sister in law today she said that pt has been on TF before. She said osmolite sounded familiar. They have used Trustient and would like to use them again.  Pt has had very little intake for a long period. I am unsure how his body will tolerate the TF. Will recommend 3 cans initially to mimic what he is doing now and to advance as tolerated.   Estimated Needs: Kcals 1300-1500  (35-40 kcal/kg) Protein 67-74 g Pro (1.8-2 g/bw) Fluid 1.3-1.5 liters  Pt had stated he was drinking ensure plus for his nutrition,. MD note quantifies 2-3 bottles a day.  Current Estimated Pt Intake Kcals [phone]  Protein 26 g Pro  TF Regimen Start at 3 cans of Osmolite 1.2 a day. Increase as tolerated to 5 cans of osmolite 1.2 a day with 50 ml flush before and after each can.  At Goal, Provides:  Kcals: 1422 Protein: 66 g  Fluid: 975 mls (+500 from flushes)   Having significant difficulty reaching [employer] to see the order through. Will continue to attempt tommorow   [name] RD, LDN Nutrition Pager: [phone] 08/12/2014 12:55 PM

## 2014-08-13 ENCOUNTER — Encounter: Payer: Self-pay | Admitting: Dietician

## 2014-08-13 MED ORDER — OSMOLITE 1.2 CAL PO LIQD: ORAL | Status: AC

## 2014-08-13 NOTE — Progress Notes (Signed)
Spoke with representative from [facility]. She said that because Jason Strong no longer has a trach in place he is no longer eligible for their nursing care which included help with enteral feeds. They defer home health/enteral supply management to [facility]. Will place order to them.   [name] RD, LDN Nutrition Pager: [phone] 08/13/2014 11:11 AM

## 2014-08-14 ENCOUNTER — Telehealth: Payer: Self-pay | Admitting: *Deleted

## 2014-08-14 ENCOUNTER — Ambulatory Visit (INDEPENDENT_AMBULATORY_CARE_PROVIDER_SITE_OTHER): Payer: Medicare Other | Admitting: Pharmacist

## 2014-08-14 ENCOUNTER — Other Ambulatory Visit (HOSPITAL_COMMUNITY): Payer: Self-pay | Admitting: *Deleted

## 2014-08-14 LAB — POCT INR: INR: 2.4

## 2014-08-14 NOTE — Telephone Encounter (Signed)
Spoke with Arbie Cookey with [facility].   Advised that patient will continue current warfarin dose of 3mg  - take 1 tablet on thursdays and 1/2 tablet all other days. She also mentioned that patient will have G-Tube replaced next week.  Suggested holding warfarin the day prior to procedure.   Also she asks if she can order barrier cream for a decubitus ulcer on his sacral area.  She will send order for Dr Sabra Heck to review.

## 2014-08-14 NOTE — Telephone Encounter (Signed)
Protime 28.9  INR 2.4

## 2014-08-20 ENCOUNTER — Telehealth: Payer: Self-pay | Admitting: *Deleted

## 2014-08-20 MED ORDER — NYSTATIN 100000 UNIT/GM EX POWD: CUTANEOUS | Status: AC

## 2014-08-20 NOTE — Patient Instructions (Addendum)
[place]   CHEMOTHERAPY INSTRUCTIONS  OPDIVO administration: You will receive this medication once every 2 weeks. It will take 1 hour for this medication to infuse.  You have been given an [place] wallet card. Please keep this in your wallet. You have also been given an Opdivo Patient Monitoring Checklist. On this checklist, while reviewing, if anything becomes a YES - please call us!  OPDIVO (nivolumab) is a prescription medicine used to treat a type of advanced stage lung cancer (non-small cell lung cancer) that has spread or grown and you have tried chemotherapy that contains platinum, and it did not work or is no longer working.   The most common side effects of OPDIVO in people with non-small cell lung cancer include: feeling tired; pain in muscles, bones, and joints; decreased appetite; cough; constipation; shortness of breath; and nausea.  Tell us immediately if you get these symptoms during an infusion of OPDIVO: chills or shaking; itching or rash; flushing; difficulty breathing; dizziness; fever; and feeling like passing out.   Serious side effects may include:   Lung problems (pneumonitis). Symptoms of pneumonitis may include: new or worsening cough; chest pain; and shortness of breath.   Intestinal problems (colitis) that can lead to tears or holes in your intestine. Signs and symptoms of colitis may include: diarrhea (loose stools) or more bowel movements than usual; blood in your stools or dark, tarry, sticky stools; and severe stomach area (abdomen) pain or tenderness.   Liver problems (hepatitis). Signs and symptoms of hepatitis may include: yellowing of your skin or the whites of your eyes; severe nausea or vomiting; pain on the right side of your stomach area (abdomen); drowsiness; dark urine (tea colored); bleeding or bruising more easily than normal; and feeling less hungry than usual.   Hormone gland problems (especially the  thyroid, pituitary, and glands). Signs and symptoms that your hormone glands are not working properly may include: headaches that will not go away or unusual headaches; extreme tiredness, weight gain or weight loss; dizziness or fainting; changes in mood or behavior, such as decreased sex drive, irritability, or forgetfulness; hair loss; feeling cold; constipation; and voice gets deeper.   Kidney problems, including nephritis and kidney failure. Signs of kidney problems may include: decrease in the amount of urine; blood in your urine; swelling in your ankles; and loss of appetite.   Inflammation of the brain (encephalitis). Signs and symptoms of encephalitis may include: headache; fever; tiredness or weakness; confusion; memory problems; sleepiness; seeing or hearing things that are not really there (hallucinations); seizures; and stiff neck.   Problems in other organs. Signs of these problems may include: rash; changes in eyesight; severe or persistent muscle or joint pains; and severe muscle weakness.   Do not ignore any symptoms!!!!! We need to know if you are having any side effects or potential side effects of this treatment!   EDUCATIONAL MATERIALS GIVEN AND REVIEWED: Chemotherapy and You  Specific Instructions Sheets: Opdivo   SELF CARE ACTIVITIES WHILE ON CHEMOTHERAPY: Increase your fluid intake 48 hours prior to treatment and drink at least 2 quarts per day after treatment., No alcohol intake., No aspirin or other medications unless approved by your oncologist., Eat foods that are light and easy to digest., Eat foods at cold or room temperature., No fried, fatty, or spicy foods immediately before or after treatment., Have teeth cleaned professionally before starting treatment. Keep dentures and partial plates clean., Use soft toothbrush and do not use mouthwashes  that contain alcohol. Biotene is a good mouthwash that is available at most pharmacies or may be ordered by calling  [phone]., Use warm salt water gargles (1 teaspoon salt per 1 quart warm water) before and after meals and at bedtime. Or you may rinse with 2 tablespoons of three -percent hydrogen peroxide mixed in eight ounces of water., Always use sunscreen with SPF (Sun Protection Factor) of 30 or higher., Use your nausea medication as directed to prevent nausea., Use your stool softener or laxative as directed to prevent constipation. and Use your anti-diarrheal medication as directed to stop diarrhea.  Please wash your hands for at least 30 seconds using warm soapy water. Handwashing is the #1 way to prevent the spread of germs. Stay away from sick people or people who are getting over a cold. If you develop respiratory systems such as green/yellow mucus production or productive cough or persistent cough let us know and we will see if you need an antibiotic. It is a good idea to keep a pair of gloves on when going into grocery stores/[employer] to decrease your risk of coming into contact with germs on the carts, etc. Carry alcohol hand gel with you at all times and use it frequently if out in public. All foods need to be cooked thoroughly. No raw foods. No medium or undercooked meats, eggs. If your food is cooked medium well, it does not need to be hot pink or saturated with bloody liquid at all. Vegetables and fruits need to be washed/rinsed under the faucet with a dish detergent before being consumed. You can eat raw fruits and vegetables unless we tell you otherwise but it would be best if you cooked them or bought frozen. Do not eat off of salad bars or hot bars unless you really trust the cleanliness of the restaurant. If you need dental work, please let Dr. Whitney Muse know before you go for your appointment so that we can coordinate the best possible time for you in regards to your chemo regimen. You need to also let your dentist know that you are actively taking chemo. We may need to do labs prior to your dental  appointment. We also want your bowels moving at least every other day. If this is not happening, we need to know so that we can get you on a bowel regimen to help you go.     SYMPTOMS TO REPORT AS SOON AS POSSIBLE AFTER TREATMENT:  FEVER GREATER THAN 100.5 F  CHILLS WITH OR WITHOUT FEVER  NAUSEA AND VOMITING THAT IS NOT CONTROLLED WITH YOUR NAUSEA MEDICATION  UNUSUAL SHORTNESS OF BREATH  UNUSUAL BRUISING OR BLEEDING  TENDERNESS IN MOUTH AND THROAT WITH OR WITHOUT PRESENCE OF ULCERS  URINARY PROBLEMS  BOWEL PROBLEMS  UNUSUAL RASH    Wear comfortable clothing and clothing appropriate for easy access to any Portacath or PICC line. Let us know if there is anything that we can do to make your therapy better!      I have been informed and understand all of the instructions given to me and have received a copy. I have been instructed to call the clinic [phone] or my family physician as soon as possible for continued medical care, if indicated. I do not have any more questions at this time but understand that I may call the [facility] or the Patient Navigator at [phone] during office hours should I have questions or need assistance in obtaining follow-up care.  Nivolumab injection What is this medicine? NIVOLUMAB (nye VOL ue mab) is used to treat certain types of melanoma and lung cancer. This medicine may be used for other purposes; ask your health care provider or pharmacist if you have questions. COMMON BRAND NAME(S): Opdivo What should I tell my health care provider before I take this medicine? They need to know if you have any of these conditions: -eye disease, vision problems -history of pancreatitis -immune system problems -inflammatory bowel disease -kidney disease -liver disease -lung disease -lupus -myasthenia gravis -multiple sclerosis -organ transplant -stomach or intestine problems -thyroid disease -tingling of the fingers or  toes, or other nerve disorder -an unusual or allergic reaction to nivolumab, other medicines, foods, dyes, or preservatives -pregnant or trying to get pregnant -breast-feeding How should I use this medicine? This medicine is for infusion into a vein. It is given by a health care professional in a hospital or clinic setting. A special MedGuide will be given to you before each treatment. Be sure to read this information carefully each time. Talk to your pediatrician regarding the use of this medicine in children. Special care may be needed. Overdosage: If you think you've taken too much of this medicine contact a poison control center or emergency room at once. Overdosage: If you think you have taken too much of this medicine contact a poison control center or emergency room at once. NOTE: This medicine is only for you. Do not share this medicine with others. What if I miss a dose? It is important not to miss your dose. Call your doctor or health care professional if you are unable to keep an appointment. What may interact with this medicine? Interactions have not been studied. This list may not describe all possible interactions. Give your health care provider a list of all the medicines, herbs, non-prescription drugs, or dietary supplements you use. Also tell them if you smoke, drink alcohol, or use illegal drugs. Some items may interact with your medicine. What should I watch for while using this medicine? Tell your doctor or healthcare professional if your symptoms do not start to get better or if they get worse. Your condition will be monitored carefully while you are receiving this medicine. You may need blood work done while you are taking this medicine. What side effects may I notice from receiving this medicine? Side effects that you should report to your doctor or health care professional as soon as possible: -allergic reactions like skin rash, itching or hives, swelling of the face,  lips, or tongue -black, tarry stools -bloody or watery diarrhea -changes in vision -chills -cough -depressed mood -eye pain -feeling anxious -fever -general ill feeling or flu-like symptoms -hair loss -loss of appetite -low blood counts - this medicine may decrease the number of white blood cells, red blood cells and platelets. You may be at increased risk for infections and bleeding -pain, tingling, numbness in the hands or feet -redness, blistering, peeling or loosening of the skin, including inside the mouth -red pinpoint spots on skin -signs of decreased platelets or bleeding - bruising, pinpoint red spots on the skin, black, tarry stools, blood in the urine -signs of decreased red blood cells - unusually weak or tired, feeling faint or lightheaded, falls -signs of infection - fever or chills, cough, sore throat, pain or trouble passing urine -signs and symptoms of a dangerous change in heartbeat or heart rhythm like chest pain; dizziness; fast or irregular heartbeat; palpitations; feeling faint or lightheaded, falls; breathing problems -  signs and symptoms of high blood sugar such as dizziness; dry mouth; dry skin; fruity breath; nausea; stomach pain; increased hunger or thirst; increased urination -signs and symptoms of kidney injury like trouble passing urine or change in the amount of urine -signs and symptoms of liver injury like dark yellow or brown urine; general ill feeling or flu-like symptoms; light-colored stools; loss of appetite; nausea; right upper belly pain; unusually weak or tired; yellowing of the eyes or skin -signs and symptoms of increased potassium like muscle weakness; chest pain; or fast, irregular heartbeat -signs and symptoms of low potassium like muscle cramps or muscle pain; chest pain; dizziness; feeling faint or lightheaded, falls; palpitations; breathing problems; or fast, irregular heartbeat -swelling of the ankles, feet, hands -weight gainSide effects  that usually do not require medical attention (report to your doctor or health care professional if they continue or are bothersome): -constipation -general ill feeling or flu-like symptoms -hair loss -loss of appetite -nausea, vomiting This list may not describe all possible side effects. Call your doctor for medical advice about side effects. You may report side effects to FDA at 1-800-FDA-1088. Where should I keep my medicine? This drug is given in a hospital or clinic and will not be stored at home. NOTE: This sheet is a summary. It may not cover all possible information. If you have questions about this medicine, talk to your doctor, pharmacist, or health care provider.  2015, [employer]. (2013-04-01 13:18:19) Nivolumab injection What is this medicine? NIVOLUMAB (nye VOL ue mab) is used to treat certain types of melanoma and lung cancer. This medicine may be used for other purposes; ask your health care provider or pharmacist if you have questions. COMMON BRAND NAME(S): Opdivo What should I tell my health care provider before I take this medicine? They need to know if you have any of these conditions: -eye disease, vision problems -history of pancreatitis -immune system problems -inflammatory bowel disease -kidney disease -liver disease -lung disease -lupus -myasthenia gravis -multiple sclerosis -organ transplant -stomach or intestine problems -thyroid disease -tingling of the fingers or toes, or other nerve disorder -an unusual or allergic reaction to nivolumab, other medicines, foods, dyes, or preservatives -pregnant or trying to get pregnant -breast-feeding How should I use this medicine? This medicine is for infusion into a vein. It is given by a health care professional in a hospital or clinic setting. A special MedGuide will be given to you before each treatment. Be sure to read this information carefully each time. Talk to your pediatrician regarding the  use of this medicine in children. Special care may be needed. Overdosage: If you think you've taken too much of this medicine contact a poison control center or emergency room at once. Overdosage: If you think you have taken too much of this medicine contact a poison control center or emergency room at once. NOTE: This medicine is only for you. Do not share this medicine with others. What if I miss a dose? It is important not to miss your dose. Call your doctor or health care professional if you are unable to keep an appointment. What may interact with this medicine? Interactions have not been studied. This list may not describe all possible interactions. Give your health care provider a list of all the medicines, herbs, non-prescription drugs, or dietary supplements you use. Also tell them if you smoke, drink alcohol, or use illegal drugs. Some items may interact with your medicine. What should I watch for while using this medicine?  Tell your doctor or healthcare professional if your symptoms do not start to get better or if they get worse. Your condition will be monitored carefully while you are receiving this medicine. You may need blood work done while you are taking this medicine. What side effects may I notice from receiving this medicine? Side effects that you should report to your doctor or health care professional as soon as possible: -allergic reactions like skin rash, itching or hives, swelling of the face, lips, or tongue -black, tarry stools -bloody or watery diarrhea -changes in vision -chills -cough -depressed mood -eye pain -feeling anxious -fever -general ill feeling or flu-like symptoms -hair loss -loss of appetite -low blood counts - this medicine may decrease the number of white blood cells, red blood cells and platelets. You may be at increased risk for infections and bleeding -pain, tingling, numbness in the hands or feet -redness, blistering, peeling or loosening  of the skin, including inside the mouth -red pinpoint spots on skin -signs of decreased platelets or bleeding - bruising, pinpoint red spots on the skin, black, tarry stools, blood in the urine -signs of decreased red blood cells - unusually weak or tired, feeling faint or lightheaded, falls -signs of infection - fever or chills, cough, sore throat, pain or trouble passing urine -signs and symptoms of a dangerous change in heartbeat or heart rhythm like chest pain; dizziness; fast or irregular heartbeat; palpitations; feeling faint or lightheaded, falls; breathing problems -signs and symptoms of high blood sugar such as dizziness; dry mouth; dry skin; fruity breath; nausea; stomach pain; increased hunger or thirst; increased urination -signs and symptoms of kidney injury like trouble passing urine or change in the amount of urine -signs and symptoms of liver injury like dark yellow or brown urine; general ill feeling or flu-like symptoms; light-colored stools; loss of appetite; nausea; right upper belly pain; unusually weak or tired; yellowing of the eyes or skin -signs and symptoms of increased potassium like muscle weakness; chest pain; or fast, irregular heartbeat -signs and symptoms of low potassium like muscle cramps or muscle pain; chest pain; dizziness; feeling faint or lightheaded, falls; palpitations; breathing problems; or fast, irregular heartbeat -swelling of the ankles, feet, hands -weight gainSide effects that usually do not require medical attention (report to your doctor or health care professional if they continue or are bothersome): -constipation -general ill feeling or flu-like symptoms -hair loss -loss of appetite -nausea, vomiting This list may not describe all possible side effects. Call your doctor for medical advice about side effects. You may report side effects to FDA at 1-800-FDA-1088. Where should I keep my medicine? This drug is given in a hospital or clinic and will  not be stored at home. NOTE: This sheet is a summary. It may not cover all possible information. If you have questions about this medicine, talk to your doctor, pharmacist, or health care provider.  2015, [employer]. (2013-04-01 13:18:19) Nivolumab injection What is this medicine? NIVOLUMAB (nye VOL ue mab) is used to treat certain types of melanoma and lung cancer. This medicine may be used for other purposes; ask your health care provider or pharmacist if you have questions. COMMON BRAND NAME(S): Opdivo What should I tell my health care provider before I take this medicine? They need to know if you have any of these conditions: -eye disease, vision problems -history of pancreatitis -immune system problems -inflammatory bowel disease -kidney disease -liver disease -lung disease -lupus -myasthenia gravis -multiple sclerosis -organ transplant -stomach or intestine  problems -thyroid disease -tingling of the fingers or toes, or other nerve disorder -an unusual or allergic reaction to nivolumab, other medicines, foods, dyes, or preservatives -pregnant or trying to get pregnant -breast-feeding How should I use this medicine? This medicine is for infusion into a vein. It is given by a health care professional in a hospital or clinic setting. A special MedGuide will be given to you before each treatment. Be sure to read this information carefully each time. Talk to your pediatrician regarding the use of this medicine in children. Special care may be needed. Overdosage: If you think you've taken too much of this medicine contact a poison control center or emergency room at once. Overdosage: If you think you have taken too much of this medicine contact a poison control center or emergency room at once. NOTE: This medicine is only for you. Do not share this medicine with others. What if I miss a dose? It is important not to miss your dose. Call your doctor or health care  professional if you are unable to keep an appointment. What may interact with this medicine? Interactions have not been studied. This list may not describe all possible interactions. Give your health care provider a list of all the medicines, herbs, non-prescription drugs, or dietary supplements you use. Also tell them if you smoke, drink alcohol, or use illegal drugs. Some items may interact with your medicine. What should I watch for while using this medicine? Tell your doctor or healthcare professional if your symptoms do not start to get better or if they get worse. Your condition will be monitored carefully while you are receiving this medicine. You may need blood work done while you are taking this medicine. What side effects may I notice from receiving this medicine? Side effects that you should report to your doctor or health care professional as soon as possible: -allergic reactions like skin rash, itching or hives, swelling of the face, lips, or tongue -black, tarry stools -bloody or watery diarrhea -changes in vision -chills -cough -depressed mood -eye pain -feeling anxious -fever -general ill feeling or flu-like symptoms -hair loss -loss of appetite -low blood counts - this medicine may decrease the number of white blood cells, red blood cells and platelets. You may be at increased risk for infections and bleeding -pain, tingling, numbness in the hands or feet -redness, blistering, peeling or loosening of the skin, including inside the mouth -red pinpoint spots on skin -signs of decreased platelets or bleeding - bruising, pinpoint red spots on the skin, black, tarry stools, blood in the urine -signs of decreased red blood cells - unusually weak or tired, feeling faint or lightheaded, falls -signs of infection - fever or chills, cough, sore throat, pain or trouble passing urine -signs and symptoms of a dangerous change in heartbeat or heart rhythm like chest pain; dizziness;  fast or irregular heartbeat; palpitations; feeling faint or lightheaded, falls; breathing problems -signs and symptoms of high blood sugar such as dizziness; dry mouth; dry skin; fruity breath; nausea; stomach pain; increased hunger or thirst; increased urination -signs and symptoms of kidney injury like trouble passing urine or change in the amount of urine -signs and symptoms of liver injury like dark yellow or brown urine; general ill feeling or flu-like symptoms; light-colored stools; loss of appetite; nausea; right upper belly pain; unusually weak or tired; yellowing of the eyes or skin -signs and symptoms of increased potassium like muscle weakness; chest pain; or fast, irregular heartbeat -signs and symptoms  of low potassium like muscle cramps or muscle pain; chest pain; dizziness; feeling faint or lightheaded, falls; palpitations; breathing problems; or fast, irregular heartbeat -swelling of the ankles, feet, hands -weight gainSide effects that usually do not require medical attention (report to your doctor or health care professional if they continue or are bothersome): -constipation -general ill feeling or flu-like symptoms -hair loss -loss of appetite -nausea, vomiting This list may not describe all possible side effects. Call your doctor for medical advice about side effects. You may report side effects to FDA at 1-800-FDA-1088. Where should I keep my medicine? This drug is given in a hospital or clinic and will not be stored at home. NOTE: This sheet is a summary. It may not cover all possible information. If you have questions about this medicine, talk to your doctor, pharmacist, or health care provider.  2015, [employer]. (2013-04-01 13:18:19)

## 2014-08-20 NOTE — Telephone Encounter (Signed)
I would normally repeat pro time in one month when it's in the normal range and there are no changes to be made. If there are other extenuating circumstances I am not aware

## 2014-08-20 NOTE — Telephone Encounter (Signed)
Protime done by [facility] today. INR 2.6, when do they test again. I need to let [facility] know.

## 2014-08-20 NOTE — Telephone Encounter (Signed)
done

## 2014-08-21 ENCOUNTER — Inpatient Hospital Stay (HOSPITAL_COMMUNITY): Payer: Medicaid Other

## 2014-08-21 ENCOUNTER — Ambulatory Visit (HOSPITAL_COMMUNITY): Payer: Medicaid Other | Admitting: Hematology & Oncology

## 2014-08-21 NOTE — Telephone Encounter (Signed)
Jason Strong at [facility] with new order, she believes he will be on hospice by next week

## 2014-08-24 ENCOUNTER — Ambulatory Visit (INDEPENDENT_AMBULATORY_CARE_PROVIDER_SITE_OTHER): Payer: Medicare Other | Admitting: Family Medicine

## 2014-08-24 DIAGNOSIS — I10 Essential (primary) hypertension: Secondary | ICD-10-CM | POA: Diagnosis not present

## 2014-08-24 DIAGNOSIS — D638 Anemia in other chronic diseases classified elsewhere: Secondary | ICD-10-CM

## 2014-08-24 DIAGNOSIS — C021 Malignant neoplasm of border of tongue: Secondary | ICD-10-CM | POA: Diagnosis not present

## 2014-08-24 DIAGNOSIS — C023 Malignant neoplasm of anterior two-thirds of tongue, part unspecified: Secondary | ICD-10-CM | POA: Diagnosis not present

## 2014-08-27 ENCOUNTER — Other Ambulatory Visit (HOSPITAL_COMMUNITY): Payer: Self-pay | Admitting: Hematology & Oncology

## 2014-08-27 ENCOUNTER — Ambulatory Visit (HOSPITAL_COMMUNITY): Payer: Medicaid Other

## 2014-08-27 ENCOUNTER — Encounter (HOSPITAL_COMMUNITY): Payer: Medicaid Other | Attending: Oncology | Admitting: Oncology

## 2014-08-27 ENCOUNTER — Other Ambulatory Visit: Payer: Self-pay | Admitting: Family Medicine

## 2014-08-27 ENCOUNTER — Inpatient Hospital Stay (HOSPITAL_COMMUNITY): Payer: Medicaid Other

## 2014-08-27 ENCOUNTER — Encounter (HOSPITAL_COMMUNITY): Payer: Self-pay | Admitting: Oncology

## 2014-08-27 ENCOUNTER — Other Ambulatory Visit (HOSPITAL_COMMUNITY): Payer: Self-pay | Admitting: Interventional Radiology

## 2014-08-27 VITALS — BP 93/64 | HR 107 | Temp 98.6°F | Resp 18 | Wt 79.0 lb

## 2014-08-27 DIAGNOSIS — C023 Malignant neoplasm of anterior two-thirds of tongue, part unspecified: Secondary | ICD-10-CM | POA: Diagnosis present

## 2014-08-27 DIAGNOSIS — Z86718 Personal history of other venous thrombosis and embolism: Secondary | ICD-10-CM

## 2014-08-27 DIAGNOSIS — G893 Neoplasm related pain (acute) (chronic): Secondary | ICD-10-CM | POA: Diagnosis not present

## 2014-08-27 DIAGNOSIS — C021 Malignant neoplasm of border of tongue: Secondary | ICD-10-CM

## 2014-08-27 MED ORDER — SULFAMETHOXAZOLE-TRIMETHOPRIM 800-160 MG PO TABS
1.0000 | ORAL_TABLET | Freq: Two times a day (BID) | ORAL | Status: AC
Start: 2014-08-27 — End: ?

## 2014-08-27 MED ORDER — OXYCODONE HCL 10 MG PO TABS: 20.0000 mg | ORAL_TABLET | ORAL | Status: AC | PRN

## 2014-08-27 NOTE — Patient Instructions (Signed)
..  [place] at [facility] Discharge Instructions  RECOMMENDATIONS MADE BY THE CONSULTANT AND ANY TEST RESULTS WILL BE SENT TO YOUR REFERRING PHYSICIAN.  Seen and discussion with Robynn Pane PA-C. Call the cancer center if you have any questions. Rx given for Oxycodone and Bactrim. Discharged to Hospice today no follow up appt needed.     Thank you for choosing [place] at [facility] to provide your oncology and hematology care.  To afford each patient quality time with our provider, please arrive at least 15 minutes before your scheduled appointment time.    You need to re-schedule your appointment should you arrive 10 or more minutes late.  We strive to give you quality time with our providers, and arriving late affects you and other patients whose appointments are after yours.  Also, if you no show three or more times for appointments you may be dismissed from the clinic at the providers discretion.     Again, thank you for choosing [facility].  Our hope is that these requests will decrease the amount of time that you wait before being seen by our physicians.       _____________________________________________________________  Should you have questions after your visit to [facility], please contact our office at [phone] between the hours of 8:30 a.m. and 4:30 p.m.  Voicemails left after 4:30 p.m. will not be returned until the following business day.  For prescription refill requests, have your pharmacy contact our office.

## 2014-08-27 NOTE — Assessment & Plan Note (Addendum)
pT4a pN2b M0 oral tongue cancer, originally of the anterior tongue, floor of mouth and abutting the adjacent mandibular alveolus with LAD History of PE 05/2012, ongoing coumadin therapy 08/08/2012 left hemiglossectomy and segmental mandibulectomy with R level 1 neck dissection and Left level 1-4 modified radical neck dissection, Left osteocutaneous radial forearm free flap repair of mandibular and tongue defect at [facility]. 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  Rx for Duoderm  Rx for Oxycodone 10 mg tablets with allowance of taking 20 mg every 4 hours PRN breakthrough pain.  Family less willing to change long-acting opoid and feel more comfortable with breakthrough pain medication.  This can be changed in the future with the help of Hospice.  No return visit scheduled.  He will be enrolling with Hospice later today.

## 2014-08-27 NOTE — Progress Notes (Signed)
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  INTERVAL HISTORY: Jason Strong 54 y.o. male returns for followup of: pT4a pN2b M0 oral tongue cancer, originally of the anterior tongue, floor of mouth and abutting the adjacent mandibular alveolus with LAD History of PE 05/2012, ongoing coumadin therapy 08/08/2012 left hemiglossectomy and segmental mandibulectomy with R level 1 neck dissection and Left level 1-4 modified radical neck dissection, Left osteocutaneous radial forearm free flap repair of mandibular and tongue defect at [facility]. 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  He notes yellowish-green drainage from his neck.  Currently it is yellow-clear in color.  He is afebrile and he denies any fevers and chills.  He requests Duoderm for a sore on his bottom.  Hospice will manage in the future.  Will increase Oxycodone to 2 tablets every 4 hours as needed since he has required more breakthrough pain medication.  I have recommended increased long-acting, but they feel more comfortable with the breakthrough pain medication.  This will be managed by Hospice.    Past Medical History  Diagnosis Date  . History of stomach ulcers   . Hypertension   . GERD (gastroesophageal reflux disease)   .  Stroke 01/2012    "left side is nervous; I'm taking PT" (06/19/2012)  . ICH (intracerebral hemorrhage) 02/20/2012  . Pulmonary embolism   . Anxiety   . Tongue cancer   . Pneumonia   . 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     is allergic to bee venom.  Current Outpatient Prescriptions on File Prior to Visit  Medication Sig Dispense Refill  . Alum & Mag Hydroxide-Simeth (MAGIC MOUTHWASH W/LIDOCAINE) SOLN Take 30 mLs by mouth.    . clindamycin (CLINDAGEL) 1 % gel Apply three times daily to affected areas    . clonazePAM (KLONOPIN) 0.5 MG tablet Take one tablet at bedtime and one tablet during the day as needed 45 tablet 1  . doxycycline (VIBRA-TABS) 100 MG tablet Take 1 tablet (100 mg total) by mouth 2 (two) times daily. 30 tablet 1  . ferrous sulfate 325 (65 FE) MG tablet Take 325 mg by mouth.    . folic acid (FOLVITE) 1 MG tablet Take 1 mg by mouth 2 (two) times daily.     . montelukast (SINGULAIR) 10 MG  tablet Take 1 tablet (10 mg total) by mouth at bedtime. 30 tablet 11  . Nutritional Supplements (FEEDING SUPPLEMENT, OSMOLITE 1.2 CAL,) LIQD Start at 3 cans of Osmolite 1.2 a day. Increase as tolerated to 5 cans of osmolite 1.2 a day with 50 ml flush before  and after each can. Provides:Kcals:1422 Protein: 66 g  Fluid: 975 mls (+500 from flushes)  0  . nystatin (MYCOSTATIN) powder Apply twice daily to affected area for 7 days 15 g 1  . omeprazole (PRILOSEC) 20 MG capsule Take 20 mg by mouth every morning.    . ondansetron (ZOFRAN) 8 MG tablet Take 8 mg by mouth every 8 (eight) hours as needed.     . OxyCODONE (OXYCONTIN) 15 mg T12A 12 hr tablet Take 1 tablet (15 mg total) by mouth every 12 (twelve) hours. 60 tablet 0  . Pediatric Multiple Vit-C-FA (FLINTSTONES/MY FIRST) WITH C & FA CHEW Chew 1 tablet by mouth daily.    . QUEtiapine (SEROQUEL) 25 MG tablet Take 1 tablet (25 mg total) by mouth 2 (two) times daily. 30 tablet 11  . warfarin (COUMADIN) 3 MG tablet TAKE 1 TABLET BY MOUTH DAILY. 30 tablet 2   No current facility-administered medications on file prior to visit.    Past Surgical History  Procedure Laterality Date  . Craniectomy  02/20/2012    Procedure: CRANIECTOMY POSTERIOR FOSSA DECOMPRESSION;  Surgeon: Charlie Pitter, MD;  Location: [place] [facility];  Service: Neurosurgery;  Laterality: N/A;  Craniectomy Posterior Fossa Decompression   . Appendectomy      "when I was young" (06/19/2012)  . Hemiglossectomy Left 08/08/2012  . Mandibulectomy  08/08/2012    with plating, left side  . Tracheostomy  08/08/2012  . Selective neck dissection  08/08/2012  . Free flap radial forearm  08/08/2012  . Skin graft split thickness leg / foot  08/08/2012  . Gastrostomy tube placement  09/03/2012    Denies any headaches, dizziness, double vision, fevers, chills, night sweats, nausea, vomiting, diarrhea, constipation, chest pain, heart palpitations, shortness of breath, blood in stool, black tarry stool, urinary pain, urinary burning, urinary frequency, hematuria.   PHYSICAL EXAMINATION  ECOG PERFORMANCE STATUS: 3 - Symptomatic, >50% confined to bed  Filed Vitals:   08/27/14 1150  BP: 93/64  Pulse: 107  Temp: 98.6 F (37 C)  Resp: 18     GENERAL:alert, no distress, cachectic, cooperative and accompanied by family. SKIN: skin color, texture, turgor are normal HEAD: Normocephalic EYES: normal, Conjunctiva are pink and non-injected EARS: External ears normal OROPHARYNX:not examined  NECK: left neck mass on inspection with erythema from radiation therapy.  Anterior middle neck leakage of yellow/clear fluid, wetting his t-shirt with neck movement and speech expelling more fluid. LYMPH:  no palpable lymphadenopathy BREAST:not examined LUNGS: not examined HEART: not examined ABDOMEN:not examined BACK: not examined EXTREMITIES:not examined  NEURO: alert & oriented x 3 with fluent speech, in wheelchair    LABORATORY DATA: CBC    Component Value Date/Time   WBC 9.7 04/24/2013 1211   WBC 14.4* 11/12/2012 1300   WBC 8.9 06/21/2012 0410   RBC 4.5* 04/24/2013 1211   RBC 3.76* 11/12/2012 1300   RBC 3.70* 06/21/2012 0410   HGB 14.7 04/24/2013 1211   HGB 12.6 11/12/2012 1300   HCT 43.8 04/24/2013 1211   HCT 37.7 11/12/2012 1300   PLT 475* 11/12/2012 1300   MCV 98.4* 04/24/2013 1211   MCV 100* 11/12/2012 1300   MCH 33.1* 04/24/2013 1211   MCH  33.5* 11/12/2012 1300   MCH 31.6 06/21/2012 0410   MCHC 33.6 04/24/2013 1211   MCHC 33.4 11/12/2012 1300   MCHC 34.4 06/21/2012 0410   RDW 15.7* 11/12/2012 1300   RDW 15.4 06/21/2012 0410   LYMPHSABS 2.1 11/12/2012 1300   LYMPHSABS 3.5 06/20/2012 0505   MONOABS 0.7 06/20/2012 0505   EOSABS 0.8* 11/12/2012 1300   EOSABS 0.4 06/20/2012 0505   BASOSABS 0.0 11/12/2012 1300   BASOSABS 0.0 06/20/2012 0505      Chemistry      Component Value Date/Time   NA 141 04/24/2013 1139   NA 137 06/20/2012 0505   K 4.3 04/24/2013 1139   CL 98 04/24/2013 1139   CO2 24 04/24/2013 1139   BUN 7 04/24/2013 1139   BUN 9.4 01/03/2013 1152   BUN 4* 06/20/2012 0505   CREATININE 0.76 04/24/2013 1139   CREATININE 0.8 01/03/2013 1152   CREATININE 0.67 06/07/2012 1229      Component  Value Date/Time   CALCIUM 9.6 04/24/2013 1139   ALKPHOS 79 04/24/2013 1139   AST 18 04/24/2013 1139   ALT 18 04/24/2013 1139   BILITOT 0.3 04/24/2013 1139        PENDING LABS:   RADIOGRAPHIC STUDIES:  Ir Replc Gastro/colonic Tube Percut W/fluoro  08/11/2014   CLINICAL DATA:  54 year old male with a history of head and neck carcinoma. He has had a long-standing indwelling percutaneous gastrostomy tube.  The tube has become damaged and he has been referred for a replacement.  Indwelling tube is a low profile Mic-Key 18 French 3 cm stoma length tube.  EXAM: GASTROSTOMY CATHETER REPLACEMENT  ANESTHESIA/SEDATION: None  MEDICATIONS: None  CONTRAST:  6mL OMNIPAQUE IOHEXOL 300 MG/ML  SOLN  PROCEDURE: The procedure, risks, benefits, and alternatives were explained to the patient. Questions regarding the procedure were encouraged and answered. The patient understands and consents to the procedure.  The patient is prepped and draped.  The indwelling tube was removed from the stoma an a new 46 French balloon retention tube was placed through the stoma into the stomach lumen. The balloon was inflated with approximately 8 cc of saline.  Contrast infused through this tube confirmed position within the stomach lumen.  Patient tolerated the procedure well and remained hemodynamically stable throughout.  No complications were encountered and no significant blood loss was encounter  .  COMPLICATIONS: None immediate  FINDINGS: New 18 French balloon retention tube within the lumen of the stomach. Tube is ready for use.  IMPRESSION: Status post exchange of percutaneous gastrostomy tube with a new 25 French balloon retention tube.  Signed,  Dulcy Fanny. Earleen Newport, DO  [facility]  [facility]  PLAN: Because the patient traditional has a low profile percutaneous gastrostomy tube, we will bring him back after ordering a new Mic-key tube for interval exchange. The catheter was placed  today as his previous tube was damaged.   Electronically Signed   By: Corrie Mckusick D.O.   On: 08/11/2014 13:27     PATHOLOGY:    ASSESSMENT AND PLAN:  Malignant neoplasm of anterior two-thirds of tongue pT4a pN2b M0 oral tongue cancer, originally of the anterior tongue, floor of mouth and abutting the adjacent mandibular alveolus with LAD History of PE 05/2012, ongoing coumadin therapy 08/08/2012 left hemiglossectomy and segmental mandibulectomy with R level 1 neck dissection and Left level 1-4 modified radical neck dissection, Left osteocutaneous radial forearm free flap repair of mandibular and tongue defect at [facility]. 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  Rx for Duoderm  Rx for Oxycodone 10 mg tablets with allowance of taking 20 mg every 4 hours PRN breakthrough pain.  Family less willing to change long-acting opoid and feel more comfortable with breakthrough pain medication.  This can be changed in the future with the help of Hospice.  No return visit scheduled.  He will be enrolling with Hospice later today.     THERAPY PLAN: Hospice.  All questions were answered. The patient knows to call the clinic with any problems, questions or concerns. We can certainly see the patient much sooner if necessary.  Patient and plan discussed with Dr. Ancil Linsey and she is in agreement with the aforementioned.   This note is electronically signed by: Doy Mince 08/27/2014 12:52 PM

## 2014-08-28 NOTE — Telephone Encounter (Signed)
Phoned in.

## 2014-08-28 NOTE — Telephone Encounter (Signed)
Last seen 07/10/14  Dr Sabra Heck  If approved route to nurse to call into [facility]. [phone]

## 2014-09-01 ENCOUNTER — Ambulatory Visit (HOSPITAL_COMMUNITY)
Admission: RE | Admit: 2014-09-01 | Discharge: 2014-09-01 | Disposition: A | Discharge status: Home / Self Care | Payer: Medicare Other | Source: Ambulatory Visit | Attending: Interventional Radiology | Admitting: Interventional Radiology

## 2014-09-01 DIAGNOSIS — C021 Malignant neoplasm of border of tongue: Secondary | ICD-10-CM

## 2014-09-01 DIAGNOSIS — Z431 Encounter for attention to gastrostomy: Secondary | ICD-10-CM | POA: Insufficient documentation

## 2014-09-01 MED ORDER — IOHEXOL 300 MG/ML  SOLN
50.0000 mL | Freq: Once | INTRAMUSCULAR | Status: DC | PRN
  Administered 2014-09-01: 10 mL via INTRAVENOUS
  Filled 2014-09-01: qty 50

## 2014-09-01 MED ORDER — LIDOCAINE VISCOUS 2 % MT SOLN
OROMUCOSAL | Status: DC
Start: 2014-09-01 — End: 2014-09-02
  Filled 2014-09-01: qty 15

## 2014-09-01 NOTE — Procedures (Signed)
G tube exchange No comp/EBL

## 2014-09-02 ENCOUNTER — Other Ambulatory Visit (HOSPITAL_COMMUNITY): Payer: Self-pay | Admitting: *Deleted

## 2014-09-02 DIAGNOSIS — C021 Malignant neoplasm of border of tongue: Secondary | ICD-10-CM

## 2014-09-02 DIAGNOSIS — C023 Malignant neoplasm of anterior two-thirds of tongue, part unspecified: Secondary | ICD-10-CM

## 2014-09-02 MED ORDER — MORPHINE SULFATE ER 30 MG PO TBCR: 30.0000 mg | EXTENDED_RELEASE_TABLET | Freq: Two times a day (BID) | ORAL | Status: DC

## 2014-09-03 ENCOUNTER — Telehealth: Payer: Self-pay | Admitting: *Deleted

## 2014-09-03 NOTE — Telephone Encounter (Signed)
Call Beth and ordered INR to be checked either 09/04/2014 or 09/05/2014.

## 2014-09-03 NOTE — Telephone Encounter (Signed)
Pt is now on Hospice, his last INR was done 08/20/14 and was 2.6. When should they get another PT? [facility] missed the one on 08/28/14.

## 2014-09-10 ENCOUNTER — Other Ambulatory Visit (HOSPITAL_COMMUNITY): Payer: Self-pay | Admitting: Hematology & Oncology

## 2014-09-10 ENCOUNTER — Other Ambulatory Visit (HOSPITAL_COMMUNITY): Payer: Self-pay | Admitting: *Deleted

## 2014-09-10 DIAGNOSIS — C021 Malignant neoplasm of border of tongue: Secondary | ICD-10-CM

## 2014-09-10 DIAGNOSIS — C023 Malignant neoplasm of anterior two-thirds of tongue, part unspecified: Secondary | ICD-10-CM

## 2014-09-10 MED ORDER — OXYCODONE HCL 30 MG PO TABS: 30.0000 mg | ORAL_TABLET | ORAL | Status: AC | PRN

## 2014-09-10 MED ORDER — MORPHINE SULFATE ER 60 MG PO TBCR: 60.0000 mg | EXTENDED_RELEASE_TABLET | Freq: Two times a day (BID) | ORAL | Status: AC

## 2014-09-11 ENCOUNTER — Telehealth: Payer: Self-pay | Admitting: Pharmacist

## 2014-09-11 ENCOUNTER — Other Ambulatory Visit (INDEPENDENT_AMBULATORY_CARE_PROVIDER_SITE_OTHER): Payer: Medicare Other

## 2014-09-11 DIAGNOSIS — I2699 Other pulmonary embolism without acute cor pulmonale: Secondary | ICD-10-CM

## 2014-09-11 NOTE — Telephone Encounter (Signed)
Called Beth - INR was suppose to be checked last week but I don't see result.  Called and left message to see if INR checked  Received call from [facility] that INR was checked today and sent to our labs.   I now see that order has been received this afternoon

## 2014-09-11 NOTE — Progress Notes (Signed)
Lab only 

## 2014-09-12 LAB — PROTIME-INR
INR: 2.9 — ABNORMAL HIGH (ref 0.8–1.2)
Prothrombin Time: 29 s — ABNORMAL HIGH (ref 9.1–12.0)

## 2014-09-14 ENCOUNTER — Encounter (HOSPITAL_COMMUNITY): Payer: Self-pay | Admitting: *Deleted

## 2014-09-14 ENCOUNTER — Emergency Department (HOSPITAL_COMMUNITY)
Admission: EM | Admit: 2014-09-14 | Discharge: 2014-09-15 | Disposition: A | Discharge status: Home / Self Care | Payer: Medicare Other | Attending: Emergency Medicine | Admitting: Emergency Medicine

## 2014-09-14 DIAGNOSIS — Z792 Long term (current) use of antibiotics: Secondary | ICD-10-CM | POA: Insufficient documentation

## 2014-09-14 DIAGNOSIS — R000 Tachycardia, unspecified: Secondary | ICD-10-CM | POA: Insufficient documentation

## 2014-09-14 DIAGNOSIS — Z86711 Personal history of pulmonary embolism: Secondary | ICD-10-CM | POA: Diagnosis not present

## 2014-09-14 DIAGNOSIS — Z720 Tobacco use: Secondary | ICD-10-CM | POA: Diagnosis not present

## 2014-09-14 DIAGNOSIS — I10 Essential (primary) hypertension: Secondary | ICD-10-CM | POA: Insufficient documentation

## 2014-09-14 DIAGNOSIS — Z85810 Personal history of malignant neoplasm of tongue: Secondary | ICD-10-CM | POA: Insufficient documentation

## 2014-09-14 DIAGNOSIS — Z7901 Long term (current) use of anticoagulants: Secondary | ICD-10-CM | POA: Insufficient documentation

## 2014-09-14 DIAGNOSIS — T85598A Other mechanical complication of other gastrointestinal prosthetic devices, implants and grafts, initial encounter: Secondary | ICD-10-CM

## 2014-09-14 DIAGNOSIS — F419 Anxiety disorder, unspecified: Secondary | ICD-10-CM | POA: Diagnosis not present

## 2014-09-14 DIAGNOSIS — Z8673 Personal history of transient ischemic attack (TIA), and cerebral infarction without residual deficits: Secondary | ICD-10-CM | POA: Insufficient documentation

## 2014-09-14 DIAGNOSIS — K219 Gastro-esophageal reflux disease without esophagitis: Secondary | ICD-10-CM | POA: Insufficient documentation

## 2014-09-14 DIAGNOSIS — K9423 Gastrostomy malfunction: Secondary | ICD-10-CM | POA: Diagnosis not present

## 2014-09-14 DIAGNOSIS — Z79899 Other long term (current) drug therapy: Secondary | ICD-10-CM | POA: Insufficient documentation

## 2014-09-14 DIAGNOSIS — Z8701 Personal history of pneumonia (recurrent): Secondary | ICD-10-CM | POA: Diagnosis not present

## 2014-09-14 NOTE — ED Notes (Signed)
pts sister in law states that pts feeding tube was changed out 2 weeks ago. States that for the past 7 days the tube has been leaking around the site.

## 2014-09-14 NOTE — ED Notes (Signed)
Dr Maryan Rued made aware of pt, and pt VS.

## 2014-09-14 NOTE — ED Notes (Signed)
Distribution called for 20Fr G tube- A11 form tubed also

## 2014-09-14 NOTE — ED Provider Notes (Addendum)
CSN: [id number]     Arrival date & time 09/14/14  2131 History   First MD Initiated Contact with Patient 09/14/14 2212     Chief Complaint  Patient presents with  . feeding tube leaking      (Consider location/radiation/quality/duration/timing/severity/associated sxs/prior Treatment) HPI Comments: Patient with multiple medical problems including end-stage cancer on hospice who has a feeding tube. He for some time had an 28 French tube however at the end of July patient had a larger tube put in because his Lavell Luster button broke and they did not have an 7 [place] available. Approximately 2 weeks ago when the new tube came in they put the smaller one end and since that time he's had excessive amounts of leaking around the tube. Family is here just to get a larger tube placed so that the leaking stops.  Patient is hypoxic and tachycardic today. Sister states that he normally has oxygen to the low 90s but typically doesn't drop low.  He has not worn oxygen but sister thinks they can get it.  The history is provided by the patient.    Past Medical History  Diagnosis Date  . History of stomach ulcers   . Hypertension   . GERD (gastroesophageal reflux disease)   . Stroke 01/2012    "left side is nervous; I'm taking PT" (06/19/2012)  . ICH (intracerebral hemorrhage) 02/20/2012  . Pulmonary embolism   . Anxiety   . Tongue cancer   . Pneumonia   . History of radiation therapy 01/14/2013-02/26/2013    66 gray to left neck   Past Surgical History  Procedure Laterality Date  . Craniectomy  02/20/2012    Procedure: CRANIECTOMY POSTERIOR FOSSA DECOMPRESSION;  Surgeon: Charlie Pitter, MD;  Location: [place] [facility];  Service: Neurosurgery;  Laterality: N/A;  Craniectomy Posterior Fossa Decompression   . Appendectomy      "when I was young" (06/19/2012)  . Hemiglossectomy Left 08/08/2012  . Mandibulectomy  08/08/2012    with plating, left side  . Tracheostomy  08/08/2012  . Selective neck dissection   08/08/2012  . Free flap radial forearm  08/08/2012  . Skin graft split thickness leg / foot  08/08/2012  . Gastrostomy tube placement  09/03/2012   Family History  Problem Relation Age of Onset  . Ovarian cancer Mother   . Stroke Father    Social History  Substance Use Topics  . Smoking status: Current Every Day Smoker -- 0.25 packs/day for 35 years    Types: Cigarettes  . Smokeless tobacco: Never Used  . Alcohol Use: No     Comment: 06/19/2012 "4-5 beers/day; probably 3 days/wk"    Review of Systems  All other systems reviewed and are negative.     Allergies  Bee venom  Home Medications   Prior to Admission medications   Medication Sig Start Date End Date Taking? Authorizing Provider  Alum & Mag Hydroxide-Simeth (MAGIC MOUTHWASH W/LIDOCAINE) SOLN Take 30 mLs by mouth 3 (three) times daily as needed for mouth pain.  04/02/14  Yes Historical Provider, MD  clonazePAM (KLONOPIN) 0.5 MG tablet TAKE 1 TABLET BY MOUTH DAILY AS NEEDED AND 1 TABLET AT BEDTIME. 08/28/14  Yes Wardell Honour, MD  doxycycline (VIBRA-TABS) 100 MG tablet Take 1 tablet (100 mg total) by mouth 2 (two) times daily. 08/08/14  Yes Patrici Ranks, MD  ferrous sulfate 325 (65 FE) MG tablet Take 325 mg by mouth 2 (two) times daily with a meal.  Yes Historical Provider, MD  folic acid (FOLVITE) 1 MG tablet Take 1 mg by mouth 2 (two) times daily.  09/28/12  Yes Historical Provider, MD  montelukast (SINGULAIR) 10 MG tablet Take 1 tablet (10 mg total) by mouth at bedtime. 11/18/13  Yes Lysbeth Penner, FNP  morphine (MS CONTIN) 60 MG 12 hr tablet Take 1 tablet (60 mg total) by mouth every 12 (twelve) hours. 09/10/14  Yes Patrici Ranks, MD  Nutritional Supplements (FEEDING SUPPLEMENT, OSMOLITE 1.2 CAL,) LIQD Start at 3 cans of Osmolite 1.2 a day. Increase as tolerated to 5 cans of osmolite 1.2 a day with 50 ml flush before and after each can. Provides:Kcals:1422 Protein: 66 g  Fluid: 975 mls (+500 from flushes) 08/13/14   Yes Patrici Ranks, MD  nystatin (MYCOSTATIN) powder Apply twice daily to affected area for 7 days 08/20/14  Yes Wardell Honour, MD  omeprazole (PRILOSEC) 20 MG capsule Take 20 mg by mouth every morning.   Yes Historical Provider, MD  ondansetron (ZOFRAN) 8 MG tablet Take 8 mg by mouth every 8 (eight) hours as needed.  03/26/14  Yes Historical Provider, MD  oxycodone (ROXICODONE) 30 MG immediate release tablet Take 1 tablet (30 mg total) by mouth every 3 (three) hours as needed for pain. 09/10/14  Yes Patrici Ranks, MD  Pediatric Multiple Vit-C-FA (FLINTSTONES/MY FIRST) WITH C & FA CHEW Chew 1 tablet by mouth daily.   Yes Historical Provider, MD  QUEtiapine (SEROQUEL) 25 MG tablet Take 1 tablet (25 mg total) by mouth 2 (two) times daily. 11/18/13  Yes Lysbeth Penner, FNP  warfarin (COUMADIN) 3 MG tablet TAKE 1 TABLET BY MOUTH DAILY. Patient taking differently: TAKES 3MG  ON THURS AND SUN, ALL OTHER DAYS TAKES 1.5MG  08/08/14  Yes Chipper Herb, MD  OxyCODONE (OXYCONTIN) 15 mg T12A 12 hr tablet Take 1 tablet (15 mg total) by mouth every 12 (twelve) hours. 08/08/14   Patrici Ranks, MD  Oxycodone HCl 10 MG TABS Take 2 tablets (20 mg total) by mouth every 4 (four) hours as needed. 08/27/14 09/26/14  Baird Cancer, PA-C  sulfamethoxazole-trimethoprim (BACTRIM DS,SEPTRA DS) 800-160 MG per tablet Take 1 tablet by mouth 2 (two) times daily. 08/27/14   Baird Cancer, PA-C   BP 96/71 mmHg  Pulse 118  Temp(Src) 99.3 F (37.4 C)  Resp 18  SpO2 91% Physical Exam  Constitutional: He is oriented to person, place, and time. He appears well-developed. He appears cachectic. No distress.  HENT:  Head: Normocephalic and atraumatic.  Mouth/Throat: Oropharynx is clear and moist.  Eyes: Conjunctivae and EOM are normal. Pupils are equal, round, and reactive to light.  Neck: Normal range of motion. Neck supple.  Oozing not fully healed old trach stoma.  Mass protruding from the left side of neck   Cardiovascular: Regular rhythm and intact distal pulses.  Tachycardia present.   No murmur heard. Pulmonary/Chest: Effort normal and breath sounds normal. No respiratory distress. He has no wheezes. He has no rales.  Abdominal: Soft. He exhibits no distension. There is no tenderness. There is no rebound and no guarding.  58french button placed  Musculoskeletal: Normal range of motion. He exhibits no edema or tenderness.  Neurological: He is alert and oriented to person, place, and time.  Skin: Skin is warm and dry. No rash noted. No erythema.  Psychiatric: He has a normal mood and affect. His behavior is normal.  Nursing note and vitals reviewed.   ED Course  Procedures (including critical care time) Labs Review Labs Reviewed - No data to display  Imaging Review No results found. I have personally reviewed and evaluated these images and lab results as part of my medical decision-making.   EKG Interpretation None      MDM   Final diagnoses:  Feeding tube dysfunction, initial encounter    Patient with a history of tongue cancer which is moved to throat cancer status post radiation, resection and chemotherapy who is now currently on hospice. Patient gets his feeds through feeding tube which was recently replaced about 2 weeks ago. Prior to that patient had a larger feeding tube in because they did not have an 51 [place] Mickey button. 2 weeks ago they got the 31 [place] and N/A replaced it in radiology. Since that time patient has had massive leaking around his tube which soaks is closed and his bedding. They are here today to get a larger tube put in so that the leaking stops. They deny any bleeding around the tube or any other issues at this time. Patient gets home hospice care and has a 24-hour nurse. Today upon arrival patient was hypoxic in the mid to high 80s. Sister states that this is new and is slightly concerned about this. He does not currently wear oxygen at home however  sister is going to speak with the nurse to see if he get oxygen at home. Patiently currently denies shortness of breath.  We'll attempt to replace the tube with a 15 [place] because there was never documentation of how large the other tube was.  11:52 PM g-tube replaced with 14french and pt d/ced home.  He will get home oxygen first thing in the morning.  Blanchie Dessert, MD 09/14/14 8003  Blanchie Dessert, MD 09/14/14 4917

## 2014-09-15 ENCOUNTER — Encounter (HOSPITAL_COMMUNITY): Payer: Self-pay | Admitting: *Deleted

## 2014-09-15 ENCOUNTER — Emergency Department (HOSPITAL_COMMUNITY)
Admission: EM | Admit: 2014-09-15 | Discharge: 2014-09-15 | Disposition: A | Discharge status: Home / Self Care | Payer: Medicare Other | Attending: Emergency Medicine | Admitting: Emergency Medicine

## 2014-09-15 ENCOUNTER — Ambulatory Visit (INDEPENDENT_AMBULATORY_CARE_PROVIDER_SITE_OTHER): Payer: Medicare Other | Admitting: Pharmacist

## 2014-09-15 ENCOUNTER — Emergency Department (HOSPITAL_COMMUNITY)
Admission: EM | Admit: 2014-09-15 | Discharge: 2014-09-15 | Discharge status: Against Medical Advice | Payer: Medicare Other | Attending: Emergency Medicine | Admitting: Emergency Medicine

## 2014-09-15 DIAGNOSIS — Z8673 Personal history of transient ischemic attack (TIA), and cerebral infarction without residual deficits: Secondary | ICD-10-CM | POA: Insufficient documentation

## 2014-09-15 DIAGNOSIS — I10 Essential (primary) hypertension: Secondary | ICD-10-CM | POA: Insufficient documentation

## 2014-09-15 DIAGNOSIS — T85598A Other mechanical complication of other gastrointestinal prosthetic devices, implants and grafts, initial encounter: Secondary | ICD-10-CM

## 2014-09-15 DIAGNOSIS — Z720 Tobacco use: Secondary | ICD-10-CM | POA: Insufficient documentation

## 2014-09-15 DIAGNOSIS — Z79899 Other long term (current) drug therapy: Secondary | ICD-10-CM | POA: Insufficient documentation

## 2014-09-15 DIAGNOSIS — K219 Gastro-esophageal reflux disease without esophagitis: Secondary | ICD-10-CM | POA: Insufficient documentation

## 2014-09-15 DIAGNOSIS — K9423 Gastrostomy malfunction: Secondary | ICD-10-CM | POA: Insufficient documentation

## 2014-09-15 DIAGNOSIS — Z85810 Personal history of malignant neoplasm of tongue: Secondary | ICD-10-CM | POA: Diagnosis not present

## 2014-09-15 DIAGNOSIS — Z7901 Long term (current) use of anticoagulants: Secondary | ICD-10-CM | POA: Diagnosis not present

## 2014-09-15 DIAGNOSIS — Z792 Long term (current) use of antibiotics: Secondary | ICD-10-CM | POA: Diagnosis not present

## 2014-09-15 DIAGNOSIS — F419 Anxiety disorder, unspecified: Secondary | ICD-10-CM | POA: Insufficient documentation

## 2014-09-15 DIAGNOSIS — Z8701 Personal history of pneumonia (recurrent): Secondary | ICD-10-CM | POA: Insufficient documentation

## 2014-09-15 DIAGNOSIS — Z86711 Personal history of pulmonary embolism: Secondary | ICD-10-CM | POA: Insufficient documentation

## 2014-09-15 DIAGNOSIS — Z431 Encounter for attention to gastrostomy: Secondary | ICD-10-CM | POA: Insufficient documentation

## 2014-09-15 NOTE — ED Notes (Signed)
Spoke with dr Kathrynn Humble regarding pt. Will review chart.

## 2014-09-15 NOTE — ED Provider Notes (Signed)
CSN: [id number]     Arrival date & time 09/15/14  1551 History   First MD Initiated Contact with Patient 09/15/14 1558     Chief Complaint  Patient presents with  . feeding tube leaking      (Consider location/radiation/quality/duration/timing/severity/associated sxs/prior Treatment) HPI   54 year old male with leakage around his feeding tube. Placed secondary to history of tongue cancer. He still does take some intake by mouth. To persist exchange yesterday because of leaking. He apparently had a larger diameter tube placed, but continues to leak. No other acute complaints. Noted to be hypoxic but was noted to be so yesterday. Patient feels like his breathing is at his baseline.  Past Medical History  Diagnosis Date  . History of stomach ulcers   . Hypertension   . GERD (gastroesophageal reflux disease)   . Stroke 01/2012    "left side is nervous; I'm taking PT" (06/19/2012)  . ICH (intracerebral hemorrhage) 02/20/2012  . Pulmonary embolism   . Anxiety   . Tongue cancer   . Pneumonia   . History of radiation therapy 01/14/2013-02/26/2013    66 gray to left neck   Past Surgical History  Procedure Laterality Date  . Craniectomy  02/20/2012    Procedure: CRANIECTOMY POSTERIOR FOSSA DECOMPRESSION;  Surgeon: Charlie Pitter, MD;  Location: [facility];  Service: Neurosurgery;  Laterality: N/A;  Craniectomy Posterior Fossa Decompression   . Appendectomy      "when I was young" (06/19/2012)  . Hemiglossectomy Left 08/08/2012  . Mandibulectomy  08/08/2012    with plating, left side  . Tracheostomy  08/08/2012  . Selective neck dissection  08/08/2012  . Free flap radial forearm  08/08/2012  . Skin graft split thickness leg / foot  08/08/2012  . Gastrostomy tube placement  09/03/2012   Family History  Problem Relation Age of Onset  . Ovarian cancer Mother   . Stroke Father    Social History  Substance Use Topics  . Smoking status: Current Every Day Smoker -- 0.25 packs/day for 35 years    Types: Cigarettes  . Smokeless tobacco: Never Used  . Alcohol Use: No     Comment: 06/19/2012 "4-5 beers/day; probably 3 days/wk"    Review of Systems  All systems reviewed and negative, other than as noted in HPI.   Allergies  Bee venom  Home Medications   Prior to Admission medications   Medication Sig Start Date End Date Taking? Authorizing Provider  Alum & Mag Hydroxide-Simeth (MAGIC MOUTHWASH W/LIDOCAINE) SOLN Take 30 mLs by mouth 3 (three) times daily as needed for mouth pain.  04/02/14   Historical Provider, MD  clonazePAM (KLONOPIN) 0.5 MG tablet TAKE 1 TABLET BY MOUTH DAILY AS NEEDED AND 1 TABLET AT BEDTIME. 08/28/14   Wardell Honour, MD  doxycycline (VIBRA-TABS) 100 MG tablet Take 1 tablet (100 mg total) by mouth 2 (two) times daily. 08/08/14   Patrici Ranks, MD  ferrous sulfate 325 (65 FE) MG tablet Take 325 mg by mouth 2 (two) times daily with a meal.     Historical Provider, MD  folic acid (FOLVITE) 1 MG tablet Take 1 mg by mouth 2 (two) times daily.  09/28/12   Historical Provider, MD  montelukast (SINGULAIR) 10 MG tablet Take 1 tablet (10 mg total) by mouth at bedtime. 11/18/13   Lysbeth Penner, FNP  morphine (MS CONTIN) 60 MG 12 hr tablet Take 1 tablet (60 mg total) by mouth every 12 (twelve) hours. 09/10/14  Patrici Ranks, MD  Nutritional Supplements (FEEDING SUPPLEMENT, OSMOLITE 1.2 CAL,) LIQD Start at 3 cans of Osmolite 1.2 a day. Increase as tolerated to 5 cans of osmolite 1.2 a day with 50 ml flush before and after each can. Provides:Kcals:1422 Protein: 66 g  Fluid: 975 mls (+500 from flushes) 08/13/14   Patrici Ranks, MD  nystatin (MYCOSTATIN) powder Apply twice daily to affected area for 7 days 08/20/14   Wardell Honour, MD  omeprazole (PRILOSEC) 20 MG capsule Take 20 mg by mouth every morning.    Historical Provider, MD  ondansetron (ZOFRAN) 8 MG tablet Take 8 mg by mouth every 8 (eight) hours as needed.  03/26/14   Historical Provider, MD  OxyCODONE  (OXYCONTIN) 15 mg T12A 12 hr tablet Take 1 tablet (15 mg total) by mouth every 12 (twelve) hours. 08/08/14   Patrici Ranks, MD  oxycodone (ROXICODONE) 30 MG immediate release tablet Take 1 tablet (30 mg total) by mouth every 3 (three) hours as needed for pain. 09/10/14   Patrici Ranks, MD  Oxycodone HCl 10 MG TABS Take 2 tablets (20 mg total) by mouth every 4 (four) hours as needed. 08/27/14 09/26/14  Baird Cancer, PA-C  Pediatric Multiple Vit-C-FA (FLINTSTONES/MY FIRST) WITH C & FA CHEW Chew 1 tablet by mouth daily.    Historical Provider, MD  QUEtiapine (SEROQUEL) 25 MG tablet Take 1 tablet (25 mg total) by mouth 2 (two) times daily. 11/18/13   Lysbeth Penner, FNP  sulfamethoxazole-trimethoprim (BACTRIM DS,SEPTRA DS) 800-160 MG per tablet Take 1 tablet by mouth 2 (two) times daily. 08/27/14   Baird Cancer, PA-C  warfarin (COUMADIN) 3 MG tablet TAKE 1 TABLET BY MOUTH DAILY. Patient taking differently: TAKES 3MG  ON THURS AND SUN, ALL OTHER DAYS TAKES 1.5MG  08/08/14   Chipper Herb, MD   BP 102/71 mmHg  Pulse 103  Temp(Src) 98 F (36.7 C) (Oral)  Resp 18  Ht 5\' 7"  (1.702 m)  Wt 83 lb (37.649 kg)  BMI 13.00 kg/m2  SpO2 90% Physical Exam  Constitutional: He appears well-developed and well-nourished. No distress.  HENT:  Head: Normocephalic and atraumatic.  Large neck/face mass left side.  Eyes: Conjunctivae are normal. Right eye exhibits no discharge. Left eye exhibits no discharge.  Neck: Neck supple.  Cardiovascular: Normal rate, regular rhythm and normal heart sounds.  Exam reveals no gallop and no friction rub.   No murmur heard. Pulmonary/Chest: Effort normal and breath sounds normal. No respiratory distress.  Abdominal: Soft. He exhibits no distension. There is no tenderness.  Gastric tube in place. Some yellowish material noted. Dig. No Obvious Leaking When Bandage Was Removed, but with Palpation of the Abdomen and There Is a Small Amount of leakage around the tube. Tube  was flushed and withdrawn easily.  Musculoskeletal: He exhibits no edema or tenderness.  Neurological: He is alert.  Skin: Skin is warm and dry.  Psychiatric: He has a normal mood and affect. His behavior is normal. Thought content normal.  Nursing note and vitals reviewed.   ED Course  Procedures (including critical care time) Labs Review Labs Reviewed - No data to display  Imaging Review No results found. I have personally reviewed and evaluated these images and lab results as part of my medical decision-making.   EKG Interpretation None      MDM   Final diagnoses:  Feeding tube dysfunction, initial encounter    53yM with leaking from feeding tube. Otherwise functioning properly. Was  hoping to exchange for larger size. Currently has 53F. None available in larger side. No one available at IR currently to try and schedule. Pt/daughter provided with number [phone] to schedule tube exchange later this week as well as GI contact information.     Virgel Manifold, MD 09/25/14 [phone]

## 2014-09-15 NOTE — ED Notes (Signed)
Patient states he was seen at [facility] last night and had his feeding tube replaced and now the new feeding tube is leaking.

## 2014-09-15 NOTE — ED Notes (Signed)
Patient family member spoke with this nurse that they have appt with another doctor. Pt family member taking patient to this doctors appoint. This nurse attempted to convince patient to stay and see the doctor here. Pt AAOX4, in NAD, wheeled out by family member.

## 2014-09-15 NOTE — ED Notes (Signed)
Pt placed on )2 via [place]. Sats on RA in upper 70s and low 80s. MD at bedside.

## 2014-09-15 NOTE — Progress Notes (Signed)
Patient's INR was therapeutic.  Recommend continue warfarin 3mg  take 1 tablet on Thursdays and 1/2 tablet all other days.  Patient's caregiver 0 Caren Griffins notified and also notified Beth at [facility].  Recommend recheck INR in 1 week.

## 2014-09-15 NOTE — ED Notes (Signed)
Pt here for g tube placement. Pt hd one placed last night and it came out. Pt family reports that he is normally hypotensive and on 3 L home O2. Pt is a hospice pt as well.

## 2014-09-19 NOTE — Progress Notes (Signed)
This encounter was created in error - please disregard.

## 2014-09-24 ENCOUNTER — Telehealth: Payer: Self-pay | Admitting: Pharmacist

## 2014-09-24 ENCOUNTER — Ambulatory Visit (INDEPENDENT_AMBULATORY_CARE_PROVIDER_SITE_OTHER): Payer: Medicare Other | Admitting: Pharmacist

## 2014-09-25 NOTE — Telephone Encounter (Signed)
Patient is due to have INR checked.  Called to remind Hospice to draw when they are next out.   Beth was unavailable so left message with secretary for her to call me.

## 2014-09-25 NOTE — Telephone Encounter (Signed)
Jason Strong with Hospice called to notify that Jason Strong passed away today.

## 2014-09-25 DEATH — deceased

## 2015-01-08 ENCOUNTER — Other Ambulatory Visit: Payer: Self-pay | Admitting: Nurse Practitioner
